# Patient Record
Sex: Female | Born: 1935 | Race: White | Hispanic: No | Smoking: Former smoker
Health system: Southern US, Community
[De-identification: ages and names within clinical notes are randomized; demographics above are authoritative.]

## PROBLEM LIST (undated history)

## (undated) DIAGNOSIS — E669 Obesity, unspecified: Secondary | ICD-10-CM

## (undated) DIAGNOSIS — R351 Nocturia: Secondary | ICD-10-CM

## (undated) DIAGNOSIS — K5790 Diverticulosis of intestine, part unspecified, without perforation or abscess without bleeding: Secondary | ICD-10-CM

## (undated) DIAGNOSIS — K449 Diaphragmatic hernia without obstruction or gangrene: Secondary | ICD-10-CM

## (undated) DIAGNOSIS — R0602 Shortness of breath: Secondary | ICD-10-CM

## (undated) DIAGNOSIS — M810 Age-related osteoporosis without current pathological fracture: Secondary | ICD-10-CM

## (undated) DIAGNOSIS — C50919 Malignant neoplasm of unspecified site of unspecified female breast: Secondary | ICD-10-CM

## (undated) DIAGNOSIS — M2550 Pain in unspecified joint: Secondary | ICD-10-CM

## (undated) DIAGNOSIS — H269 Unspecified cataract: Secondary | ICD-10-CM

## (undated) DIAGNOSIS — R350 Frequency of micturition: Secondary | ICD-10-CM

## (undated) DIAGNOSIS — C801 Malignant (primary) neoplasm, unspecified: Secondary | ICD-10-CM

## (undated) DIAGNOSIS — R0981 Nasal congestion: Secondary | ICD-10-CM

## (undated) DIAGNOSIS — J3489 Other specified disorders of nose and nasal sinuses: Secondary | ICD-10-CM

## (undated) DIAGNOSIS — H3530 Unspecified macular degeneration: Secondary | ICD-10-CM

## (undated) DIAGNOSIS — R7303 Prediabetes: Secondary | ICD-10-CM

## (undated) DIAGNOSIS — I4891 Unspecified atrial fibrillation: Secondary | ICD-10-CM

## (undated) DIAGNOSIS — Z9889 Other specified postprocedural states: Secondary | ICD-10-CM

## (undated) DIAGNOSIS — E785 Hyperlipidemia, unspecified: Secondary | ICD-10-CM

## (undated) DIAGNOSIS — K5792 Diverticulitis of intestine, part unspecified, without perforation or abscess without bleeding: Secondary | ICD-10-CM

## (undated) DIAGNOSIS — I10 Essential (primary) hypertension: Secondary | ICD-10-CM

## (undated) DIAGNOSIS — R112 Nausea with vomiting, unspecified: Secondary | ICD-10-CM

## (undated) DIAGNOSIS — M1990 Unspecified osteoarthritis, unspecified site: Secondary | ICD-10-CM

## (undated) HISTORY — DX: Diaphragmatic hernia without obstruction or gangrene: K44.9

## (undated) HISTORY — PX: HEMORRHOID SURGERY: SHX153

## (undated) HISTORY — DX: Unspecified atrial fibrillation: I48.91

## (undated) HISTORY — PX: COLONOSCOPY: SHX174

## (undated) HISTORY — DX: Prediabetes: R73.03

## (undated) HISTORY — PX: ESOPHAGOGASTRODUODENOSCOPY: SHX1529

## (undated) HISTORY — DX: Unspecified macular degeneration: H35.30

## (undated) HISTORY — DX: Obesity, unspecified: E66.9

## (undated) HISTORY — DX: Nasal congestion: R09.81

## (undated) HISTORY — DX: Malignant (primary) neoplasm, unspecified: C80.1

## (undated) HISTORY — DX: Diverticulitis of intestine, part unspecified, without perforation or abscess without bleeding: K57.92

## (undated) HISTORY — DX: Age-related osteoporosis without current pathological fracture: M81.0

---

## 1982-04-27 HISTORY — PX: OTHER SURGICAL HISTORY: SHX169

## 1991-04-28 HISTORY — PX: OTHER SURGICAL HISTORY: SHX169

## 1999-11-07 ENCOUNTER — Ambulatory Visit (HOSPITAL_COMMUNITY): Admission: RE | Admit: 1999-11-07 | Discharge: 1999-11-07 | Payer: Self-pay | Admitting: Gastroenterology

## 2000-09-07 ENCOUNTER — Other Ambulatory Visit: Admission: RE | Admit: 2000-09-07 | Discharge: 2000-09-07 | Payer: Self-pay | Admitting: Family Medicine

## 2003-02-26 DIAGNOSIS — H3530 Unspecified macular degeneration: Secondary | ICD-10-CM

## 2003-02-26 HISTORY — DX: Unspecified macular degeneration: H35.30

## 2006-04-27 HISTORY — PX: JOINT REPLACEMENT: SHX530

## 2006-04-27 HISTORY — PX: OTHER SURGICAL HISTORY: SHX169

## 2006-08-03 ENCOUNTER — Inpatient Hospital Stay (HOSPITAL_COMMUNITY): Admission: RE | Admit: 2006-08-03 | Discharge: 2006-08-06 | Payer: Self-pay | Admitting: Orthopedic Surgery

## 2006-08-05 ENCOUNTER — Ambulatory Visit: Payer: Self-pay | Admitting: Physical Medicine & Rehabilitation

## 2006-08-06 ENCOUNTER — Inpatient Hospital Stay (HOSPITAL_COMMUNITY)
Admission: RE | Admit: 2006-08-06 | Discharge: 2006-08-16 | Payer: Self-pay | Admitting: Physical Medicine & Rehabilitation

## 2011-04-07 ENCOUNTER — Other Ambulatory Visit: Payer: Self-pay

## 2011-04-08 ENCOUNTER — Other Ambulatory Visit: Payer: Self-pay | Admitting: Radiology

## 2011-04-08 DIAGNOSIS — C50912 Malignant neoplasm of unspecified site of left female breast: Secondary | ICD-10-CM

## 2011-04-09 ENCOUNTER — Other Ambulatory Visit: Payer: Self-pay | Admitting: *Deleted

## 2011-04-09 ENCOUNTER — Telehealth: Payer: Self-pay | Admitting: *Deleted

## 2011-04-09 DIAGNOSIS — C50519 Malignant neoplasm of lower-outer quadrant of unspecified female breast: Secondary | ICD-10-CM

## 2011-04-09 NOTE — Telephone Encounter (Signed)
Confirmed [facility] for 04/15/11 at 0815 .  Instructions and contact information given.

## 2011-04-14 ENCOUNTER — Ambulatory Visit
Admission: RE | Admit: 2011-04-14 | Discharge: 2011-04-14 | Disposition: A | Discharge status: Home / Self Care | Payer: Medicare Other | Source: Ambulatory Visit | Attending: Radiology | Admitting: Radiology

## 2011-04-14 DIAGNOSIS — C50912 Malignant neoplasm of unspecified site of left female breast: Secondary | ICD-10-CM

## 2011-04-14 MED ORDER — GADOBENATE DIMEGLUMINE 529 MG/ML IV SOLN: 5.0000 mL | Freq: Once | INTRAVENOUS | Status: DC | PRN

## 2011-04-14 MED ORDER — GADOBENATE DIMEGLUMINE 529 MG/ML IV SOLN
17.0000 mL | Freq: Once | INTRAVENOUS | Status: AC | PRN
  Administered 2011-04-14: 17 mL via INTRAVENOUS

## 2011-04-15 ENCOUNTER — Ambulatory Visit: Payer: Medicare Other

## 2011-04-15 ENCOUNTER — Other Ambulatory Visit (INDEPENDENT_AMBULATORY_CARE_PROVIDER_SITE_OTHER): Payer: Self-pay | Admitting: General Surgery

## 2011-04-15 ENCOUNTER — Ambulatory Visit (HOSPITAL_BASED_OUTPATIENT_CLINIC_OR_DEPARTMENT_OTHER): Payer: Medicare Other | Admitting: General Surgery

## 2011-04-15 ENCOUNTER — Ambulatory Visit (HOSPITAL_BASED_OUTPATIENT_CLINIC_OR_DEPARTMENT_OTHER): Payer: Medicare Other | Admitting: Oncology

## 2011-04-15 ENCOUNTER — Telehealth: Payer: Self-pay | Admitting: Oncology

## 2011-04-15 ENCOUNTER — Telehealth: Payer: Self-pay | Admitting: *Deleted

## 2011-04-15 ENCOUNTER — Ambulatory Visit: Payer: Medicare Other | Admitting: Physical Therapy

## 2011-04-15 ENCOUNTER — Ambulatory Visit
Admission: RE | Admit: 2011-04-15 | Discharge: 2011-04-15 | Disposition: A | Discharge status: Home / Self Care | Payer: Medicare Other | Source: Ambulatory Visit | Attending: Radiation Oncology | Admitting: Radiation Oncology

## 2011-04-15 ENCOUNTER — Encounter (INDEPENDENT_AMBULATORY_CARE_PROVIDER_SITE_OTHER): Payer: Self-pay | Admitting: General Surgery

## 2011-04-15 ENCOUNTER — Other Ambulatory Visit (HOSPITAL_BASED_OUTPATIENT_CLINIC_OR_DEPARTMENT_OTHER): Payer: Medicare Other | Admitting: Lab

## 2011-04-15 ENCOUNTER — Ambulatory Visit: Payer: Medicare Other | Admitting: Radiation Oncology

## 2011-04-15 VITALS — BP 130/76 | HR 106 | Temp 98.2°F | Ht 60.0 in | Wt 187.7 lb

## 2011-04-15 DIAGNOSIS — C50912 Malignant neoplasm of unspecified site of left female breast: Secondary | ICD-10-CM | POA: Insufficient documentation

## 2011-04-15 DIAGNOSIS — Z809 Family history of malignant neoplasm, unspecified: Secondary | ICD-10-CM | POA: Insufficient documentation

## 2011-04-15 DIAGNOSIS — Z79899 Other long term (current) drug therapy: Secondary | ICD-10-CM | POA: Insufficient documentation

## 2011-04-15 DIAGNOSIS — C50419 Malignant neoplasm of upper-outer quadrant of unspecified female breast: Secondary | ICD-10-CM | POA: Insufficient documentation

## 2011-04-15 DIAGNOSIS — C50919 Malignant neoplasm of unspecified site of unspecified female breast: Secondary | ICD-10-CM

## 2011-04-15 DIAGNOSIS — Z170 Estrogen receptor positive status [ER+]: Secondary | ICD-10-CM | POA: Insufficient documentation

## 2011-04-15 DIAGNOSIS — C50519 Malignant neoplasm of lower-outer quadrant of unspecified female breast: Secondary | ICD-10-CM

## 2011-04-15 LAB — CBC WITH DIFFERENTIAL/PLATELET
Basophils Absolute: 0 10*3/uL (ref 0.0–0.1)
Eosinophils Absolute: 0.1 10*3/uL (ref 0.0–0.5)
HGB: 13.6 g/dL (ref 11.6–15.9)
MCV: 94.8 fL (ref 79.5–101.0)
MONO#: 0.6 10*3/uL (ref 0.1–0.9)
NEUT#: 3.7 10*3/uL (ref 1.5–6.5)
RBC: 4.28 10*6/uL (ref 3.70–5.45)
RDW: 14.1 % (ref 11.2–14.5)
WBC: 6.3 10*3/uL (ref 3.9–10.3)
lymph#: 2 10*3/uL (ref 0.9–3.3)

## 2011-04-15 LAB — COMPREHENSIVE METABOLIC PANEL
Albumin: 3.8 g/dL (ref 3.5–5.2)
Alkaline Phosphatase: 52 U/L (ref 39–117)
BUN: 15 mg/dL (ref 6–23)
Calcium: 9.9 mg/dL (ref 8.4–10.5)
Chloride: 99 mEq/L (ref 96–112)
Glucose, Bld: 88 mg/dL (ref 70–99)
Potassium: 3.8 mEq/L (ref 3.5–5.3)
Sodium: 138 mEq/L (ref 135–145)
Total Protein: 7 g/dL (ref 6.0–8.3)

## 2011-04-15 LAB — CANCER ANTIGEN 27.29: CA 27.29: 22 U/mL (ref 0–39)

## 2011-04-15 NOTE — Progress Notes (Signed)
Kathy [name] is an 77 y.o. female.   Chief Complaint: left breast cancer HPI: this patient is seen in our multidisciplinary breast clinic for evaluation of a newly diagnosed invasive left breast cancer discovered on screening mammogram. She denies any prior mammographic abnormalities in the fistulous herself breast exam and has not found any abnormal masses. She denies any systemic symptoms such as headaches, bony pains, or weight loss. On imaging this is a 1 cm mass at the 2:00 position an ultrasound with MRI findings of a 1.7 cm x 1.0 cm area of enhancement in the area of concern. It is ER and PR positive and HER-2/neu negative.  Past Medical History  Diagnosis Date  . Hiatal hernia   . Diverticulitis   hypertension and hyperlipidemia and osteoporosis  Past Surgical History  Procedure Date  . Left breast biopsy 1984  . Right breast biopsy 1993  . Bilateral knee replacements 2008    Family History  Problem Relation Age of Onset  . Cancer Mother     pancreatic  . Cancer Father     esophageal  . Cancer Sister     ovarian  . Cancer Sister     lung  . Cancer Brother     kidney  . Cancer Sister     breast cancer   Social History:  does not have a smoking history on file. She does not have any smokeless tobacco history on file. Her alcohol and drug histories not on file.  Allergies: No Known Allergies  No current outpatient prescriptions on file as of 04/15/2011.   Medications Prior to Admission  Medication Dose Route Frequency Provider Last Rate Last Dose  . gadobenate dimeglumine (MULTIHANCE) injection 17 mL  17 mL Intravenous Once PRN Medication Radiologist   17 mL at 04/14/11 1048  . DISCONTD: gadobenate dimeglumine (MULTIHANCE) injection 5 mL  5 mL Intravenous Once PRN Medication Radiologist        Results for orders placed in visit on 04/15/11 (from the past 48 hour(s))  CBC WITH DIFFERENTIAL     Status: Normal   Collection Time   04/15/11  8:29 AM      Component  Value Range Comment   WBC 6.3  3.9 - 10.3 (10e3/uL)    NEUT# 3.7  1.5 - 6.5 (10e3/uL)    HGB 13.6  11.6 - 15.9 (g/dL)    HCT 21.3  08.6 - 57.8 (%)    Platelets 235  145 - 400 (10e3/uL)    MCV 94.8  79.5 - 101.0 (fL)    MCH 31.9  25.1 - 34.0 (pg)    MCHC 33.6  31.5 - 36.0 (g/dL)    RBC 4.69  6.29 - 5.28 (10e6/uL)    RDW 14.1  11.2 - 14.5 (%)    lymph# 2.0  0.9 - 3.3 (10e3/uL)    MONO# 0.6  0.1 - 0.9 (10e3/uL)    Eosinophils Absolute 0.1  0.0 - 0.5 (10e3/uL)    Basophils Absolute 0.0  0.0 - 0.1 (10e3/uL)    NEUT% 57.6  38.4 - 76.8 (%)    LYMPH% 31.2  14.0 - 49.7 (%)    MONO% 9.4  0.0 - 14.0 (%)    EOS% 1.3  0.0 - 7.0 (%)    BASO% 0.5  0.0 - 2.0 (%)   COMPREHENSIVE METABOLIC PANEL     Status: Normal   Collection Time   04/15/11  8:29 AM      Component Value Range Comment  Sodium 138  135 - 145 (mEq/L)    Potassium 3.8  3.5 - 5.3 (mEq/L)    Chloride 99  96 - 112 (mEq/L)    CO2 32  19 - 32 (mEq/L)    Glucose, Bld 88  70 - 99 (mg/dL)    BUN 15  6 - 23 (mg/dL)    Creatinine, Ser 4.09  0.50 - 1.10 (mg/dL)    Total Bilirubin 0.3  0.3 - 1.2 (mg/dL)    Alkaline Phosphatase 52  39 - 117 (U/L)    AST 13  0 - 37 (U/L)    ALT 10  0 - 35 (U/L)    Total Protein 7.0  6.0 - 8.3 (g/dL)    Albumin 3.8  3.5 - 5.2 (g/dL)    Calcium 9.9  8.4 - 10.5 (mg/dL)    Mr Breast Bilateral W Wo Contrast  04/14/2011  *RADIOLOGY REPORT*  Clinical Data: 77 year old female with recently diagnosed left breast invasive mammary carcinoma.  BUN and creatinine were obtained on site at [facility] at [address] Results:  BUN 11 mg/dL,  Creatinine 0.7 mg/dL.  BILATERAL BREAST MRI WITH AND WITHOUT CONTRAST  Technique: Multiplanar, multisequence MR images of both breasts were obtained prior to and following the intravenous administration of 17ml of Multihance.  Three dimensional images were evaluated at the independent DynaCad workstation.  Comparison:  Prior mammograms from [facility] imaging dated  04/07/2011, 03/26/2011, 03/23/2011, 03/19/2010, 03/18/2009, 03/15/2008.  Findings: There is an irregular enhancing mass located within the left breast centrally located within the middle 1/3 of the breast. This is associated with clip artifact.  This has a plateau enhancement curve.  This measures 1.7 x 1.0 x 1.0 cm in size. There are no additional worrisome enhancing foci within either breast.  There is no evidence for axillary or internal mammary adenopathy.  There is 1.6 cm cyst located within the left lobe the liver.  There are no additional findings.  IMPRESSION: Solitary 1.7 cm irregular enhancing mass with associated clip artifact corresponding to the known (biopsy proven) invasive mammary carcinoma.  Incidental 1.6 cm left lobe hepatic cyst.  THREE-DIMENSIONAL MR IMAGE RENDERING ON INDEPENDENT WORKSTATION:  Three-dimensional MR images were rendered by post-processing of the original MR data on an independent workstation.  The three- dimensional MR images were interpreted, and findings were reported in the accompanying complete MRI report for this study.  BI-RADS CATEGORY 6:  Known biopsy-proven malignancy - appropriate action should be taken.  Original Report Authenticated By: Rolla Plate, M.D.    All other review of systems negative or noncontributory except as stated in the HPI   Blood pressure 130/76, pulse 106, temperature 98.2 F (36.8 C), height 5' (1.524 m), weight 187 lb 11.2 oz (85.14 kg). General appearance: alert, cooperative and no distress Head: Normocephalic, without obvious abnormality, atraumatic Eyes: conjunctivae/corneas clear. PERRL, EOM's intact. Fundi benign. Neck: no adenopathy, no carotid bruit and supple, symmetrical, trachea midline Resp: clear to auscultation bilaterally Chest wall: no tenderness Breasts: she has some diffuse nodularity bilaterally but no dominant masses, no skin changes, no lymphadenopathy appreciated bilaterally Cardio: regular rate and rhythm,  S1, S2 normal, no murmur, click, rub or gallop GI: soft, non-tender; bowel sounds normal; no masses,  no organomegaly Extremities: extremities normal, atraumatic, no cyanosis or edema Pulses: 2+ and symmetric Skin: Skin color, texture, turgor normal. No rashes or lesions Lymph nodes: Cervical, supraclavicular, and axillary nodes normal. Neurologic: grossly normal  Assessment/Plan Left breast cancer She has a  new left breast cancer and would be a fine candidate for both lumpectomy or mastectomy for treatment of this. I think that breast conservation therapy would be a good alternative for her and most easily tolerated for her. I explained the surgical options with the risks and benefits of each and she is in agreement to lumpectomy would be the best for her. I also explained that standard treatment for invasive breast cancer includes Sentinel lymph node biopsy and plus or  minus radiation therapy, however, given her age her she was not offered chemotherapy. Since she is not going to receive any chemotherapy for management of this, I explained that there is no need to perform sentinel lymph node biopsy other than for staging purposes. Again, since this is not going to influence her treatment course in any fashion, we do not need to subject her to the risks of sentinel lymph node biopsy. She is in agreement and understanding of this and we will proceed with needle localized left breast lumpectomy as soon as available. Lodema Pilot DAVID 04/15/2011, 12:42 PM

## 2011-04-15 NOTE — Progress Notes (Signed)
[facility] Radiation Oncology NEW PATIENT EVALUATION  Name: Kathy [name] MRN: [id number]  Date: 04/15/2011  DOB: [birth year]  Status: outpatient   CC:No primary provider on file.  Rulon Abide, DO    REFERRING PHYSICIAN: Rulon Abide, DO   DIAGNOSIS:  Stage I (T1, N0, M0) invasive ductal carcinoma of the left breast   HISTORY OF PRESENT ILLNESS::Kathy [name] is a 77 y.o. female who is   Seen today for evaluation of her T1 N0 invasive ductal carcinoma of the left breast. At the time of a screening mammogram at [facility] on 03/23/2011 she was noted to have a possible abnormality within the upper-outer quadrant of the left breast. Additional images and ultrasound on 03/26/2011 showed mildly suspicious findings which may represent scar from previous surgery. PS GI on 03/31/2011 showed a focally abnormal area of uptake measuring 1.6 cm corresponding to the density seen at 2 to 3:00 within the left breast. A stereotactic biopsy on 04/07/2011 showed ductal hyperplasia with a CC approach, but invasive mammary carcinoma from a.LM. approach. LV I. was seen. The tumor was strongly ER/PR positive with a low proliferation marker of 10%. Breast MRI or showed a 1.7 x 1.0 cm area of enhancement in the area of concern. The tumor was ER/PR positive and HER-2/neu negative. His she is without complaints today. She is seen with Dr. Darnelle Catalan and Dr. Biagio Quint at the [facility].Marland Kitchen   PREVIOUS RADIATION THERAPY: No   PAST MEDICAL HISTORY:  has a past medical history of Hiatal hernia and Diverticulitis.     PAST SURGICAL HISTORY: Past Surgical History  Procedure Date  . Left breast biopsy 1984  . Right breast biopsy 1993  . Bilateral knee replacements 2008     FAMILY HISTORY: family history includes Cancer in her brother, father, mother, and sisters.   SOCIAL HISTORY:  does not have a smoking history on file. She does not have any smokeless tobacco history on file.   ALLERGIES:  Review of patient's allergies indicates no known allergies.   MEDICATIONS:  Current Outpatient Prescriptions  Medication Sig Dispense Refill  . alendronate (FOSAMAX) 70 MG tablet Take 70 mg by mouth every 7 (seven) days. Take with a full glass of water on an empty stomach.       Marland Kitchen amLODipine (NORVASC) 5 MG tablet Take 5 mg by mouth daily.        Marland Kitchen aspirin 81 MG tablet Take 81 mg by mouth daily.        . calcium carbonate (OS-CAL) 600 MG TABS Take 600 mg by mouth 2 (two) times daily with a meal.        . fish oil-omega-3 fatty acids 1000 MG capsule Take 1 g by mouth daily.        . magnesium gluconate (MAGONATE) 500 MG tablet Take 500 mg by mouth.        . simvastatin (ZOCOR) 20 MG tablet Take 20 mg by mouth at bedtime.        . valsartan-hydrochlorothiazide (DIOVAN-HCT) 320-25 MG per tablet Take 1 tablet by mouth daily.        . vitamin B-12 (CYANOCOBALAMIN) 1000 MCG tablet Take 1,000 mcg by mouth daily.        . vitamin C (ASCORBIC ACID) 500 MG tablet Take 500 mg by mouth daily.        . vitamin E 400 UNIT capsule Take 400 Units by mouth daily.  REVIEW OF SYSTEMS:  Pertinent items are noted in HPI.    PHYSICAL EXAM:  Alert and oriented. Vital signs: BP 1:30/76 pulse 106 temperature 98.2 RR 20 Head and neck examination: Grossly unremarkable. Nodes: Without palpable cervical, supraclavicular, or axillary lymphadenopathy. Chest: Lungs clear. Back: Without spinal or CVA tenderness. Heart: Regular rate and rhythm. Breasts: Tablet I can feel a 1 cm mass at 4:00, 4 centers from the nipple along the lower outer quadrant of the left breast. Her biopsy was at 5:00 and also 12:00. Right breast without masses or lesions. Abdomen without hepatomegaly. Extremities without edema.    LABORATORY DATA:  Lab Results  Component Value Date   WBC 6.3 04/15/2011   HGB 13.6 04/15/2011   HCT 40.6 04/15/2011   MCV 94.8 04/15/2011   PLT 235 04/15/2011   Lab Results  Component Value Date   NA  138 04/15/2011   K 3.8 04/15/2011   CL 99 04/15/2011   CO2 32 04/15/2011   Lab Results  Component Value Date   ALT 10 04/15/2011   AST 13 04/15/2011   ALKPHOS 52 04/15/2011   BILITOT 0.3 04/15/2011     IMPRESSION: Stage I (T1, N0, M0) invasive ductal carcinoma of the left breast. I explained to the patient and her husband that her local treatment options include mastectomy versus partial mastectomy plus or minus hormone therapy, plus or minus radiation therapy. If she undergoes adjuvant hormone therapy then there is little benefit for adjuvant radiation therapy. She would be a candidate for hypo-fractionated radiation therapy if she does not tolerate hormone therapy or if she prefers not to take hormone therapy. Hormone therapy will be discussed by Dr. Darnelle Catalan. I briefly discussed the potential acute and late toxicities of radiation therapy.   PLAN: As above.   I spent 40 minutes minutes face to face with the patient and more than 50% of that time was spent in counseling and/or coordination of care.

## 2011-04-15 NOTE — Telephone Encounter (Signed)
gave patient appointment for early feb printed out calendar and gave to the patient

## 2011-04-15 NOTE — Telephone Encounter (Signed)
Gv pt appt for dec-feb2013 °

## 2011-04-15 NOTE — Progress Notes (Signed)
Kathy [name]  MR#: [id number]    History of present illness: The patient is an 77-year-old [place] woman who had routine yearly screening mammography at [facility] 03/23/2011. A possible area of distortion was noted in the upper outer quadrant of the left breast and she was recalled for additional views November 29. There was a stellate mass in the outer lower quadrant of the left breast which appears more prominent. By ultrasound there was a suggestion of a 1 cm mass at the site of the scar.  [facility] GI was performed 03/31/2011 1 showed a focal intense isotopic area of activity measuring 1.6 cm at the site in question. Biopsy of this area was performed 04/07/2011 and showed ([id number]) and invasive ductal carcinoma, grade 2, which was estrogen receptor positive at 100%, progesterone receptor positive at 69%, with an MIB-1-1 of 10%, and no HER-2 amplification.  With this information the patient was discussed 04/15/2011 at the multidisciplinary conference and subsequently the same day at the multidisciplinary breast cancer clinic.  Past medical history:     Hypertension, diverticular disease, hiatal hernia, hypercholesterolemia, osteopenia, peripheral neuropathy  Past surgical history:     Status post bilateral knee replacements under Dr. Olin, prior right breast biopsy in 1993, prior left breast biopsy in 1984, both benign.  Family history: The patient's father died from stomach or esophageal cancer at the age of 47. The patient's mother died from pancreas cancer at the age of 76 the patient has 4 sisters and 4 brothers. One brother died from kidney cancer and the other 3 from heart disease. One sister died of ovarian cancer which was diagnosed in her 60s. One sister died from "female organ cancer" diagnosed in her 70s. One sister died from lung cancer. The other surviving sister, currently 91, was just diagnosed with breast cancer.   Gynecologic history: GX P0. Menarche age 14, menopause in 1992.  She took hormone replacement approximately 8 years, stopping about 10 years ago. She had no complications from that treatment.    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 Her husband "S. L." Used to work in a machine shop. As noted, they have no children, and live by themselves. The patient drives, does all her cooking and a house chores, and is normally active for an 77-year-old. She does not exercise regularly appear    ADVANCED DIRECTIVES: No advanced directives in place  Health maintenance:       History  Substance Use Topics  . Smoking status: Not on file  . Smokeless tobacco: Not on file  . Alcohol Use: Not on file   the patient smoked less than a pack a day for a few years, quitting in the 1960s. She does not use alcohol.    Colonoscopy: 2006  PAP: 2000  Bone density: NOV 2012, lowest reading = T -1.2  Cholesterol: controlled w. Medication.  Review of systems:  She has pain in her shoulders and hips. Her vision is not what it used to be. She has a little bit of a runny nose and some sinus problems. Her dentures don't fit perfectly. She thinks she has poor circulation and that may explain her peripheral neuropathy. She does have a history of heartburn and hiatal hernia. Overall however as detailed review of systems was noncontributory.  Allergies:    No Known Allergies  Medications:      Current Outpatient Prescriptions  Medication Sig Dispense Refill  . alendronate (FOSAMAX) 70 MG   tablet Take 70 mg by mouth every 7 (seven) days. Take with a full glass of water on an empty stomach.       . amLODipine (NORVASC) 5 MG tablet Take 5 mg by mouth daily.        . aspirin 81 MG tablet Take 81 mg by mouth daily.        . calcium carbonate (OS-CAL) 600 MG TABS Take 600 mg by mouth 2 (two) times daily with a meal.        . fish oil-omega-3 fatty acids 1000 MG capsule Take 1 g by mouth daily.        . magnesium gluconate (MAGONATE) 500 MG tablet Take 500 mg  by mouth.        . simvastatin (ZOCOR) 20 MG tablet Take 20 mg by mouth at bedtime.        . valsartan-hydrochlorothiazide (DIOVAN-HCT) 320-25 MG per tablet Take 1 tablet by mouth daily.        . vitamin B-12 (CYANOCOBALAMIN) 1000 MCG tablet Take 1,000 mcg by mouth daily.        . vitamin C (ASCORBIC ACID) 500 MG tablet Take 500 mg by mouth daily.        . vitamin E 400 UNIT capsule Take 400 Units by mouth daily.         No current facility-administered medications for this visit.   Facility-Administered Medications Ordered in Other Visits  Medication Dose Route Frequency Provider Last Rate Last Dose  . gadobenate dimeglumine (MULTIHANCE) injection 17 mL  17 mL Intravenous Once PRN Medication Radiologist   17 mL at 04/14/11 1048  . DISCONTD: gadobenate dimeglumine (MULTIHANCE) injection 5 mL  5 mL Intravenous Once PRN Medication Radiologist        Physical exam:  Elderly white woman who appears slightly anxious, but in no acute distress. She is alert and oriented x3.    Filed Vitals:   04/15/11 0846  BP: 130/76  Pulse: 106  Temp: 98.2 F (36.8 C)     Body mass index is 36.66 kg/(m^2).   ECOG performance status: 0  Oropharynx: Clear  Adenopathy: No peripheral adenopathy noted and in particular the left axilla is unremarkable  Lungs: No rales or rhonchi  Heart: Regular rate and rhythm, no murmur appreciated  Right breast no suspicious masses  Left breast: Status post recent biopsy, with mild ecchymosis, no palpable mass, no nipple retraction or other skin change  Abdomen: Positive bowel sounds no masses palpated  Musculoskeletal exam: 1+ bilateral lower extremity edema, no erythema  Neurologic exam: Nonfocal  Lab results:            Chemistry      Component Value Date/Time   NA 138 04/15/2011 0829   K 3.8 04/15/2011 0829   CL 99 04/15/2011 0829   CO2 32 04/15/2011 0829   BUN 15 04/15/2011 0829   CREATININE 0.81 04/15/2011 0829      Component Value Date/Time    CALCIUM 9.9 04/15/2011 0829   ALKPHOS 52 04/15/2011 0829   AST 13 04/15/2011 0829   ALT 10 04/15/2011 0829   BILITOT 0.3 04/15/2011 0829         Lab Results  Component Value Date   WBC 6.3 04/15/2011   HGB 13.6 04/15/2011   HCT 40.6 04/15/2011   MCV 94.8 04/15/2011   PLT 235 04/15/2011   NEUTROABS 3.7 04/15/2011    Studies:      Mr Breast Bilateral W Wo Contrast    04/14/2011  *RADIOLOGY REPORT*  Clinical Data: 77-year-old female with recently diagnosed left breast invasive mammary carcinoma.  BUN and creatinine were obtained on site at [facility] at [address] Results:  BUN 11 mg/dL,  Creatinine 0.7 mg/dL.  BILATERAL BREAST MRI WITH AND WITHOUT CONTRAST  Technique: Multiplanar, multisequence MR images of both breasts were obtained prior to and following the intravenous administration of 17ml of Multihance.  Three dimensional images were evaluated at the independent DynaCad workstation.  Comparison:  Prior mammograms from [facility] dated 04/07/2011, 03/26/2011, 03/23/2011, 03/19/2010, 03/18/2009, 03/15/2008.  Findings: There is an irregular enhancing mass located within the left breast centrally located within the middle 1/3 of the breast. This is associated with clip artifact.  This has a plateau enhancement curve.  This measures 1.7 x 1.0 x 1.0 cm in size. There are no additional worrisome enhancing foci within either breast.  There is no evidence for axillary or internal mammary adenopathy.  There is 1.6 cm cyst located within the left lobe the liver.  There are no additional findings.  IMPRESSION: Solitary 1.7 cm irregular enhancing mass with associated clip artifact corresponding to the known (biopsy proven) invasive mammary carcinoma.  Incidental 1.6 cm left lobe hepatic cyst.  THREE-DIMENSIONAL MR IMAGE RENDERING ON INDEPENDENT WORKSTATION:  Three-dimensional MR images were rendered by post-processing of the original MR data on an independent workstation.  The three-  dimensional MR images were interpreted, and findings were reported in the accompanying complete MRI report for this study.  BI-RADS CATEGORY 6:  Known biopsy-proven malignancy - appropriate action should be taken.  Original Report Authenticated By: Britt JACKSON, M.D.     Assessment: 77-year-old [place] woman status post left breast biopsy December of 2012 for a clinical 1.6 cm N0 (stage I) invasive ductal carcinoma, grade 2, strongly estrogen and progesterone receptor positive, with a low MIB-1, and no HER-2 amplification.  Plan: Her case was discussed extensively at this morning at conference and again at the clinic today. We feel the patient would do best with simple lumpectomy, without sentinel lymph node sampling, as that would not affect her treatment. We also think she would do best with antiestrogen therapy, and avoid radiation a less margins are exceedingly close. All this was discussed with the patient and given to her in writing.  Even though the patient is 82 and herself does not have issue, I think she would warrant a genetics consult. This may be useful to the rest of the family if nothing else. This will be scheduled.  She will return to see me in early February. We should hold information at that time, and I will likely start her on letrozole then.     Tawney Vanorman C 04/15/2011      

## 2011-04-16 ENCOUNTER — Encounter: Payer: Self-pay | Admitting: *Deleted

## 2011-04-16 NOTE — Progress Notes (Signed)
Clinical Social Work met with pt and pt's husband at [facility].  CSW informed pt of the patient and family support center, programs, and resources.  CSW also provided pt with contact information and a patient and family support program calendar.  Pt did not express any urgent needs, and was thankful for the support.  CSW encouraged pt to contact CSW with any needs and/or concerns.   Pheonix Wisby P, LCSW

## 2011-04-17 ENCOUNTER — Ambulatory Visit: Payer: Medicare Other

## 2011-04-17 NOTE — Progress Notes (Signed)
Pt. Seen for genetic counseling.  Does not want to pursue genetic analysis.  Knows to call if changes mind

## 2011-04-22 ENCOUNTER — Encounter (HOSPITAL_COMMUNITY): Payer: Self-pay | Admitting: Pharmacy Technician

## 2011-04-23 ENCOUNTER — Telehealth: Payer: Self-pay | Admitting: *Deleted

## 2011-04-23 NOTE — Telephone Encounter (Signed)
Attempted to call pt to discuss [facility] from 04/15/11, unable to leave message, no answering machine.  Will attempt to call again.

## 2011-04-23 NOTE — Telephone Encounter (Signed)
Spoke to pt concerning [facility] from 04/15/11.  Pt denies questions or concerns at this time.  Encourage pt to call with needs.  Received verbal understanding.  Contact information given.

## 2011-04-27 ENCOUNTER — Encounter: Payer: Self-pay | Admitting: *Deleted

## 2011-04-27 NOTE — Progress Notes (Signed)
Mailed after appt letter to pt. 

## 2011-04-29 ENCOUNTER — Ambulatory Visit: Payer: Medicare Other | Admitting: Oncology

## 2011-04-30 ENCOUNTER — Other Ambulatory Visit: Payer: Self-pay

## 2011-04-30 ENCOUNTER — Encounter (HOSPITAL_COMMUNITY)
Admission: RE | Admit: 2011-04-30 | Discharge: 2011-04-30 | Disposition: A | Discharge status: Home / Self Care | Payer: Medicare Other | Source: Ambulatory Visit | Attending: Anesthesiology | Admitting: Anesthesiology

## 2011-04-30 ENCOUNTER — Encounter (HOSPITAL_COMMUNITY)
Admission: RE | Admit: 2011-04-30 | Discharge: 2011-04-30 | Disposition: A | Discharge status: Home / Self Care | Payer: Medicare Other | Source: Ambulatory Visit | Attending: General Surgery | Admitting: General Surgery

## 2011-04-30 ENCOUNTER — Encounter (HOSPITAL_COMMUNITY): Payer: Self-pay

## 2011-04-30 DIAGNOSIS — Z01811 Encounter for preprocedural respiratory examination: Secondary | ICD-10-CM | POA: Diagnosis not present

## 2011-04-30 DIAGNOSIS — E7800 Pure hypercholesterolemia, unspecified: Secondary | ICD-10-CM | POA: Diagnosis not present

## 2011-04-30 DIAGNOSIS — C50919 Malignant neoplasm of unspecified site of unspecified female breast: Secondary | ICD-10-CM

## 2011-04-30 DIAGNOSIS — K449 Diaphragmatic hernia without obstruction or gangrene: Secondary | ICD-10-CM | POA: Diagnosis not present

## 2011-04-30 DIAGNOSIS — Z01810 Encounter for preprocedural cardiovascular examination: Secondary | ICD-10-CM | POA: Diagnosis not present

## 2011-04-30 DIAGNOSIS — I10 Essential (primary) hypertension: Secondary | ICD-10-CM | POA: Diagnosis not present

## 2011-04-30 DIAGNOSIS — Z170 Estrogen receptor positive status [ER+]: Secondary | ICD-10-CM | POA: Diagnosis not present

## 2011-04-30 DIAGNOSIS — G609 Hereditary and idiopathic neuropathy, unspecified: Secondary | ICD-10-CM | POA: Diagnosis not present

## 2011-04-30 DIAGNOSIS — K08409 Partial loss of teeth, unspecified cause, unspecified class: Secondary | ICD-10-CM | POA: Diagnosis not present

## 2011-04-30 DIAGNOSIS — C50419 Malignant neoplasm of upper-outer quadrant of unspecified female breast: Secondary | ICD-10-CM | POA: Diagnosis not present

## 2011-04-30 DIAGNOSIS — Z01812 Encounter for preprocedural laboratory examination: Secondary | ICD-10-CM | POA: Diagnosis not present

## 2011-04-30 DIAGNOSIS — R0602 Shortness of breath: Secondary | ICD-10-CM | POA: Diagnosis not present

## 2011-04-30 DIAGNOSIS — Z01818 Encounter for other preprocedural examination: Secondary | ICD-10-CM | POA: Diagnosis not present

## 2011-04-30 HISTORY — DX: Frequency of micturition: R35.0

## 2011-04-30 HISTORY — DX: Unspecified osteoarthritis, unspecified site: M19.90

## 2011-04-30 HISTORY — DX: Shortness of breath: R06.02

## 2011-04-30 HISTORY — DX: Essential (primary) hypertension: I10

## 2011-04-30 HISTORY — DX: Nausea with vomiting, unspecified: R11.2

## 2011-04-30 HISTORY — DX: Nocturia: R35.1

## 2011-04-30 HISTORY — DX: Diverticulosis of intestine, part unspecified, without perforation or abscess without bleeding: K57.90

## 2011-04-30 HISTORY — DX: Hyperlipidemia, unspecified: E78.5

## 2011-04-30 HISTORY — DX: Other specified disorders of nose and nasal sinuses: J34.89

## 2011-04-30 HISTORY — DX: Other specified postprocedural states: Z98.890

## 2011-04-30 HISTORY — DX: Unspecified cataract: H26.9

## 2011-04-30 HISTORY — DX: Pain in unspecified joint: M25.50

## 2011-04-30 LAB — BASIC METABOLIC PANEL
BUN: 22 mg/dL (ref 6–23)
Chloride: 99 mEq/L (ref 96–112)
Creatinine, Ser: 0.88 mg/dL (ref 0.50–1.10)
GFR calc Af Amer: 70 mL/min — ABNORMAL LOW (ref >90)
Glucose, Bld: 92 mg/dL (ref 70–99)

## 2011-04-30 LAB — CBC
HCT: 43.7 % (ref 36.0–46.0)
Hemoglobin: 14.3 g/dL (ref 12.0–15.0)
MCH: 31 pg (ref 26.0–34.0)
MCHC: 32.7 g/dL (ref 30.0–36.0)
MCV: 94.8 fL (ref 78.0–100.0)
RDW: 13.5 % (ref 11.5–15.5)

## 2011-04-30 LAB — SURGICAL PCR SCREEN: Staphylococcus aureus: NEGATIVE

## 2011-04-30 NOTE — Progress Notes (Signed)
Pt doesn't have a cardiologist;maintained by medical MD for HTN/hyperlipidemia;dr.pickard @ [facility]  Stress test done in 1994(pt states that she had passed out and that's why this was done)

## 2011-04-30 NOTE — Pre-Procedure Instructions (Signed)
20 ROSEALIE REACH  04/30/2011   Your procedure is scheduled on:  Mon,Jan 7 @ 11:30am  Report to [facility] at 9:30 AM.  Call this number if you have problems the morning of surgery: [phone]   Remember:   Do not eat food:After Midnight.  May have clear liquids: up to 4 Hours before arrival.(until 5:30 am)  Clear liquids include soda, tea, black coffee, apple or grape juice, broth.  Take these medicines the morning of surgery with A SIP OF WATER: Amlodipine   Do not wear jewelry, make-up or nail polish.  Do not wear lotions, powders, or perfumes. You may wear deodorant.  Do not shave 48 hours prior to surgery.  Do not bring valuables to the hospital.  Contacts, dentures or bridgework may not be worn into surgery.  Leave suitcase in the car. After surgery it may be brought to your room.  For patients admitted to the hospital, checkout time is 11:00 AM the day of discharge.   Patients discharged the day of surgery will not be allowed to drive home.  Name and phone number of your driver:   Special Instructions: CHG Shower Use Special Wash: 1/2 bottle night before surgery and 1/2 bottle morning of surgery.   Please read over the following fact sheets that you were given: Pain Booklet, Coughing and Deep Breathing, MRSA Information and Surgical Site Infection Prevention

## 2011-05-03 MED ORDER — CEFAZOLIN SODIUM-DEXTROSE 2-3 GM-% IV SOLR
2.0000 g | INTRAVENOUS | Status: AC
  Administered 2011-05-04: 2 g via INTRAVENOUS
  Filled 2011-05-03 (×2): qty 50

## 2011-05-04 ENCOUNTER — Encounter (HOSPITAL_COMMUNITY): Payer: Self-pay | Admitting: Certified Registered"

## 2011-05-04 ENCOUNTER — Ambulatory Visit (HOSPITAL_COMMUNITY)
Admission: RE | Admit: 2011-05-04 | Discharge: 2011-05-04 | Disposition: A | Discharge status: Home / Self Care | Payer: Medicare Other | Source: Ambulatory Visit | Attending: General Surgery | Admitting: General Surgery

## 2011-05-04 ENCOUNTER — Other Ambulatory Visit (INDEPENDENT_AMBULATORY_CARE_PROVIDER_SITE_OTHER): Payer: Self-pay | Admitting: General Surgery

## 2011-05-04 ENCOUNTER — Encounter (HOSPITAL_COMMUNITY)
Admission: RE | Disposition: A | Discharge status: Home / Self Care | Payer: Self-pay | Source: Ambulatory Visit | Attending: General Surgery

## 2011-05-04 ENCOUNTER — Ambulatory Visit (HOSPITAL_COMMUNITY): Payer: Medicare Other | Admitting: Certified Registered"

## 2011-05-04 ENCOUNTER — Encounter (HOSPITAL_COMMUNITY): Payer: Self-pay | Admitting: *Deleted

## 2011-05-04 DIAGNOSIS — G609 Hereditary and idiopathic neuropathy, unspecified: Secondary | ICD-10-CM | POA: Insufficient documentation

## 2011-05-04 DIAGNOSIS — I10 Essential (primary) hypertension: Secondary | ICD-10-CM | POA: Insufficient documentation

## 2011-05-04 DIAGNOSIS — R0602 Shortness of breath: Secondary | ICD-10-CM | POA: Insufficient documentation

## 2011-05-04 DIAGNOSIS — Z01812 Encounter for preprocedural laboratory examination: Secondary | ICD-10-CM | POA: Insufficient documentation

## 2011-05-04 DIAGNOSIS — C50919 Malignant neoplasm of unspecified site of unspecified female breast: Secondary | ICD-10-CM | POA: Diagnosis not present

## 2011-05-04 DIAGNOSIS — E7800 Pure hypercholesterolemia, unspecified: Secondary | ICD-10-CM | POA: Insufficient documentation

## 2011-05-04 DIAGNOSIS — Z01810 Encounter for preprocedural cardiovascular examination: Secondary | ICD-10-CM | POA: Insufficient documentation

## 2011-05-04 DIAGNOSIS — K5732 Diverticulitis of large intestine without perforation or abscess without bleeding: Secondary | ICD-10-CM | POA: Diagnosis not present

## 2011-05-04 DIAGNOSIS — Z01818 Encounter for other preprocedural examination: Secondary | ICD-10-CM | POA: Insufficient documentation

## 2011-05-04 DIAGNOSIS — Z0189 Encounter for other specified special examinations: Secondary | ICD-10-CM | POA: Diagnosis not present

## 2011-05-04 DIAGNOSIS — K08409 Partial loss of teeth, unspecified cause, unspecified class: Secondary | ICD-10-CM | POA: Insufficient documentation

## 2011-05-04 DIAGNOSIS — K449 Diaphragmatic hernia without obstruction or gangrene: Secondary | ICD-10-CM | POA: Insufficient documentation

## 2011-05-04 DIAGNOSIS — C50419 Malignant neoplasm of upper-outer quadrant of unspecified female breast: Secondary | ICD-10-CM | POA: Insufficient documentation

## 2011-05-04 DIAGNOSIS — Z170 Estrogen receptor positive status [ER+]: Secondary | ICD-10-CM | POA: Insufficient documentation

## 2011-05-04 HISTORY — PX: BREAST BIOPSY: SHX20

## 2011-05-04 SURGERY — BREAST BIOPSY WITH NEEDLE LOCALIZATION
Anesthesia: General | Site: Breast | Laterality: Left | Wound class: Clean

## 2011-05-04 MED ORDER — ONDANSETRON HCL 4 MG/2ML IJ SOLN
INTRAMUSCULAR | Status: DC | PRN
  Administered 2011-05-04: 4 mg via INTRAVENOUS

## 2011-05-04 MED ORDER — MIDAZOLAM HCL 5 MG/5ML IJ SOLN
INTRAMUSCULAR | Status: DC | PRN
  Administered 2011-05-04: 2 mg via INTRAVENOUS

## 2011-05-04 MED ORDER — LIDOCAINE-EPINEPHRINE (PF) 1 %-1:200000 IJ SOLN
INTRAMUSCULAR | Status: DC | PRN
  Administered 2011-05-04: 14:00:00

## 2011-05-04 MED ORDER — MEPERIDINE HCL 25 MG/ML IJ SOLN: 6.2500 mg | INTRAMUSCULAR | Status: DC | PRN

## 2011-05-04 MED ORDER — 0.9 % SODIUM CHLORIDE (POUR BTL) OPTIME
TOPICAL | Status: DC | PRN
  Administered 2011-05-04: 1000 mL

## 2011-05-04 MED ORDER — LACTATED RINGERS IV SOLN
INTRAVENOUS | Status: DC | PRN
  Administered 2011-05-04: 12:00:00 via INTRAVENOUS

## 2011-05-04 MED ORDER — PROPOFOL 10 MG/ML IV EMUL
INTRAVENOUS | Status: DC | PRN
  Administered 2011-05-04: 150 mg via INTRAVENOUS

## 2011-05-04 MED ORDER — PROMETHAZINE HCL 25 MG/ML IJ SOLN: 6.2500 mg | INTRAMUSCULAR | Status: DC | PRN

## 2011-05-04 MED ORDER — HYDROCODONE-ACETAMINOPHEN 5-500 MG PO TABS: 1.0000 | ORAL_TABLET | Freq: Four times a day (QID) | ORAL | Status: DC | PRN

## 2011-05-04 MED ORDER — FENTANYL CITRATE 0.05 MG/ML IJ SOLN
INTRAMUSCULAR | Status: DC | PRN
  Administered 2011-05-04: 50 ug via INTRAVENOUS
  Administered 2011-05-04 (×2): 25 ug via INTRAVENOUS

## 2011-05-04 MED ORDER — CEFAZOLIN SODIUM 1-5 GM-% IV SOLN: 1.0000 g | INTRAVENOUS | Status: DC

## 2011-05-04 MED ORDER — LACTATED RINGERS IV SOLN
INTRAVENOUS | Status: DC
  Administered 2011-05-04: 12:00:00 via INTRAVENOUS

## 2011-05-04 MED ORDER — LIDOCAINE HCL (CARDIAC) 20 MG/ML IV SOLN
INTRAVENOUS | Status: DC | PRN
  Administered 2011-05-04: 80 mg via INTRAVENOUS

## 2011-05-04 MED ORDER — HYDROMORPHONE HCL PF 1 MG/ML IJ SOLN: 0.2500 mg | INTRAMUSCULAR | Status: DC | PRN

## 2011-05-04 SURGICAL SUPPLY — 47 items
ADH SKN CLS APL DERMABOND .7 (GAUZE/BANDAGES/DRESSINGS) ×1
ADH SKN CLS LQ APL DERMABOND (GAUZE/BANDAGES/DRESSINGS) ×1
APPLIER CLIP 9.375 MED OPEN (MISCELLANEOUS)
APR CLP MED 9.3 20 MLT OPN (MISCELLANEOUS)
BINDER BREAST LRG (GAUZE/BANDAGES/DRESSINGS) IMPLANT
BINDER BREAST XLRG (GAUZE/BANDAGES/DRESSINGS) ×1 IMPLANT
CANISTER SUCTION 2500CC (MISCELLANEOUS) ×1 IMPLANT
CHLORAPREP W/TINT 26ML (MISCELLANEOUS) ×2 IMPLANT
CLIP APPLIE 9.375 MED OPEN (MISCELLANEOUS) IMPLANT
CLIP TI WIDE RED SMALL 6 (CLIP) ×1 IMPLANT
CLOTH BEACON ORANGE TIMEOUT ST (SAFETY) ×2 IMPLANT
CONT SPEC 4OZ CLIKSEAL STRL BL (MISCELLANEOUS) ×1 IMPLANT
COVER SURGICAL LIGHT HANDLE (MISCELLANEOUS) ×2 IMPLANT
DECANTER SPIKE VIAL GLASS SM (MISCELLANEOUS) ×1 IMPLANT
DERMABOND ADHESIVE PROPEN (GAUZE/BANDAGES/DRESSINGS) ×1
DERMABOND ADVANCED (GAUZE/BANDAGES/DRESSINGS) ×1
DERMABOND ADVANCED .7 DNX12 (GAUZE/BANDAGES/DRESSINGS) IMPLANT
DERMABOND ADVANCED .7 DNX6 (GAUZE/BANDAGES/DRESSINGS) IMPLANT
DEVICE DUBIN SPECIMEN MAMMOGRA (MISCELLANEOUS) ×2 IMPLANT
DRAPE CHEST BREAST 15X10 FENES (DRAPES) ×2 IMPLANT
ELECT CAUTERY BLADE 6.4 (BLADE) ×2 IMPLANT
ELECT COATED BLADE 2.86 ST (ELECTRODE) ×1 IMPLANT
ELECT REM PT RETURN 9FT ADLT (ELECTROSURGICAL) ×2
ELECTRODE REM PT RTRN 9FT ADLT (ELECTROSURGICAL) ×1 IMPLANT
GLOVE BIO SURGEON STRL SZ7.5 (GLOVE) ×1 IMPLANT
GLOVE BIOGEL PI IND STRL 7.5 (GLOVE) IMPLANT
GLOVE BIOGEL PI INDICATOR 7.5 (GLOVE) ×1
GLOVE SURG SS PI 7.5 STRL IVOR (GLOVE) ×4 IMPLANT
GOWN PREVENTION PLUS XLARGE (GOWN DISPOSABLE) ×2 IMPLANT
GOWN STRL NON-REIN LRG LVL3 (GOWN DISPOSABLE) ×2 IMPLANT
KIT BASIN OR (CUSTOM PROCEDURE TRAY) ×2 IMPLANT
KIT MARKER MARGIN INK (KITS) IMPLANT
KIT ROOM TURNOVER OR (KITS) ×2 IMPLANT
NDL HYPO 25GX1X1/2 BEV (NEEDLE) ×1 IMPLANT
NEEDLE HYPO 25GX1X1/2 BEV (NEEDLE) ×2 IMPLANT
NS IRRIG 1000ML POUR BTL (IV SOLUTION) ×2 IMPLANT
PACK GENERAL/GYN (CUSTOM PROCEDURE TRAY) ×2 IMPLANT
PAD ARMBOARD 7.5X6 YLW CONV (MISCELLANEOUS) ×4 IMPLANT
STAPLER VISISTAT 35W (STAPLE) ×2 IMPLANT
SUT MNCRL AB 4-0 PS2 18 (SUTURE) ×2 IMPLANT
SUT SILK 2 0 SH (SUTURE) ×1 IMPLANT
SUT VIC AB 3-0 SH 18 (SUTURE) ×2 IMPLANT
SUT VIC AB 3-0 SH 8-18 (SUTURE) ×1 IMPLANT
SYR CONTROL 10ML LL (SYRINGE) ×2 IMPLANT
TOWEL OR 17X24 6PK STRL BLUE (TOWEL DISPOSABLE) ×2 IMPLANT
TOWEL OR 17X26 10 PK STRL BLUE (TOWEL DISPOSABLE) ×2 IMPLANT
TOWEL OR NON WOVEN STRL DISP B (DISPOSABLE) ×1 IMPLANT

## 2011-05-04 NOTE — Transfer of Care (Signed)
Immediate Anesthesia Transfer of Care Note  Patient: Kathy [name]  Procedure(s) Performed:  BREAST BIOPSY WITH NEEDLE LOCALIZATION  Patient Location: PACU  Anesthesia Type: General  Level of Consciousness: awake and oriented  Airway & Oxygen Therapy: Patient Spontanous Breathing and Patient connected to face mask oxygen  Post-op Assessment: Report given to PACU RN and Post -op Vital signs reviewed and stable  Post vital signs: Reviewed and stable  Complications: No apparent anesthesia complications

## 2011-05-04 NOTE — Op Note (Signed)
Kathy [name], Kathy [name]               ACCOUNT NO.:  [id number]  MEDICAL RECORD NO.:  [id number]  LOCATION:  [facility]                         FACILITY:  [facility]  PHYSICIAN:  Lodema Pilot, MD       DATE OF BIRTH:  1927/06/12  DATE OF PROCEDURE:  05/04/2011 DATE OF DISCHARGE:  05/04/2011                              OPERATIVE REPORT   PROCEDURE:  Left breast, needle localized lumpectomy.  PREOPERATIVE DIAGNOSIS:  Left breast cancer.  POSTOPERATIVE DIAGNOSIS:  Left breast cancer.  SURGEON:  Lodema Pilot, MD  ASSISTANT:  None.  ANESTHESIA:  General LMA anesthesia with 35 mL of 1% lidocaine with epinephrine and 0.25% Marcaine in a 50:50 mixture.  FLUIDS:  1000 mL of crystalloid.  ESTIMATED BLOOD LOSS:  Minimal.  DRAINS:  None.  SPECIMENS: 1. Left breast lumpectomy with a short-stitch mark in the superior     margin and a long-stitch mark in the lateral margin. 2. Additional anterior medial margin with the stitch mark in the new     medial margin.  COMPLICATIONS:  None.  APPARENT FINDINGS:  Successful needle localization of left breast mass, clip had apparently migrated, and so I discussed with Dr. Yolanda Bonine, the radiologist preoperatively, plan and she recommended not focusing on the clip and she feels as though she has localized the tumor with the guidewire.  After excision, she called the room to confirm that we had obtained the mass in question.  There was some calcifications near the medial margin, however, had already taken some additional anterior medial margin.  Metallic hemoclips were placed in the wound.  OPERATIVE DETAILS:  Kathy [name] was seen, evaluated in the preoperative area and risks and benefits of the procedure were again discussed in lay terms.  Informed consent was obtained.  Surgical site was marked prior to anesthetic administration.  She already had undergone needle localization of her left breast mass and she was given prophylactic antibiotics and taken to  the operating room, placed on table supine position.  General LMA anesthesia was obtained and prophylactic antibiotics were given.  Her left chest and breasts were prepped and draped in a standard surgical fashion and a semicircular incision was made in the area of the breast between the needle and the nipple-areolar complex and dissection carried down into the breast tissue using Bovie electrocautery.  Circumferential breast flaps were created and the wire was identified and pulled into the wound and the breast tissue was undermined deep to the guidewire.  Then circumferential dissection was performed using Bovie electrocautery around the guidewire at the medial aspect of her dissection.  The tip of the guidewire was identified and the lesion was undermined and the mass was x-rayed in the room and confirmation by Dr. Yolanda Bonine at [facility] that the mass in question was indeed in the center of the specimen.  However, she noticed some calcifications in the tip of the guidewire.  I had already given the fact that I had seen the tip of the guidewire, had already taken some additional anterior and medial margin and placed a stitch on the new medial margin.  The original lumpectomy specimen was marked with a short stitch on the  superior margin and a long stitch on the lateral margin.  Again additional anterior medial margin was taken with the new silk stitch marking a new medial margin.  There was no other palpable abnormalities or visible abnormalities and then the wound was inspected for hemostasis which was noted to be adequate.  Again the previous biopsy clip was not in the specimen as per Dr. Yolanda Bonine in Radiology that the clip was not in the area of concern and had apparently migrated.  She instructed me to proceed with the excision of the guidewire and the surrounding tissue as usual for procedure.  The wound was again noted to be hemostatic, and the wound was irrigated with  large amount of sterile saline solution until the irrigation returned clear. Again, double-check for hemostasis was noted be adequate.  Hemoclips were placed at 12 o'clock, 3 o'clock, 6 o'clock, and 9 o'clock positions, and one directly in the posterior margin and the dermis was approximated with interrupted 3-0 Vicryl sutures.  These sutures were placed fairly close together as to provide a watertight closure of the dermis.  The 2 medial sutures were now secured at this time, and using an 18-gauge Angiocatheter placed in the wound, I filled the biopsy cavity with 35 mL of 1% lidocaine with epinephrine, 0.25% Marcaine in a 50:50 mixture.  The final dermal sutures were secured leaving the local solution in the wound and the skin edges were approximated with 4-0 Monocryl subcuticular suture.  Skin was washed and dried, and all sponge, needle, instrument counts were correct at the end of the case. The patient tolerated the procedure well without apparent complication.  Again, I had originally recommended sentinel lymph node biopsy for this patient, standard practice given her invasive disease, however, the medical oncologist was not willing to administer chemotherapy to this patient, whether the node was positive or not, so therefore we decided to proceed with a sentinel node biopsy as this would not change her medical management.          ______________________________ Lodema Pilot, MD     BL/MEDQ  D:  05/04/2011  T:  05/04/2011  Job:  [id number]

## 2011-05-04 NOTE — Anesthesia Procedure Notes (Addendum)
Performed by: Malachi Pro   Procedure Name: LMA Insertion Date/Time: 05/04/2011 1:09 PM Performed by: Malachi Pro Pre-anesthesia Checklist: Patient identified, Emergency Drugs available, Suction available, Patient being monitored and Timeout performed Patient Re-evaluated:Patient Re-evaluated prior to inductionOxygen Delivery Method: Circle System Utilized Preoxygenation: Pre-oxygenation with 100% oxygen Intubation Type: IV induction LMA: LMA inserted LMA Size: 4.0 Tube secured with: Tape Dental Injury: Teeth and Oropharynx as per pre-operative assessment

## 2011-05-04 NOTE — Progress Notes (Signed)
Report given to sheryll rn as caregiver

## 2011-05-04 NOTE — Preoperative (Signed)
Beta Blockers   Reason not to administer Beta Blockers:Not Applicable 

## 2011-05-04 NOTE — Interval H&P Note (Signed)
History and Physical Interval Note:  05/04/2011 12:05 PM  Kathy [name]  has presented today for surgery, with the diagnosis of Left breast cancer.  The various methods of treatment have been discussed with the patient and family. After consideration of risks, benefits and other options for treatment, the patient has consented to  Procedure(s): BREAST BIOPSY WITH NEEDLE LOCALIZATION as a surgical intervention .  The patients' history has been reviewed, patient examined, no change in status, stable for surgery.  I have reviewed the patients' chart and labs.  Questions were answered to the patient's satisfaction.  Pt. Seen and examined.  Site marked.  Needle loc placed.  I again discussed with her the risks of the procedure including infection, bleeding, pain, scarring, recurrence, need for reexcision, and I again explained that the norm is for sentinel lymph node biopsy given invasive tumor, however, since the oncologist is not willing to give her chemo, we have decided not to subject her to the sentinel lymph node portion of the procedure, since this will not affect her treatment in any way.   Lodema Pilot DAVID

## 2011-05-04 NOTE — Brief Op Note (Signed)
05/04/2011  2:33 PM  PATIENT:  Suan Halter  78 y.o. female  PRE-OPERATIVE DIAGNOSIS:  Left breast cancer  POST-OPERATIVE DIAGNOSIS:  left breast cancer  PROCEDURE:  Procedure(s): BREAST BIOPSY WITH NEEDLE LOCALIZATION  SURGEON:  Surgeon(s): Rulon Abide, DO  PHYSICIAN ASSISTANT:   ASSISTANTS: none   ANESTHESIA:   general  EBL:  Total I/O In: 1000 [I.V.:1000] Out: -   BLOOD ADMINISTERED:none  DRAINS: none   LOCAL MEDICATIONS USED:  MARCAINE 17CC and LIDOCAINE 17CC  SPECIMEN:  Source of Specimen:  #1: left breast lumpectomy (SS/LL), #2: additional anterior medial margin (suture on new medial margin)  DISPOSITION OF SPECIMEN:  PATHOLOGY  COUNTS:  YES  TOURNIQUET:  * No tourniquets in log *  DICTATION: .Other Dictation: Dictation Number [id number]  PLAN OF CARE: Discharge to home after PACU  PATIENT DISPOSITION:  PACU - hemodynamically stable.   Delay start of Pharmacological VTE agent (>24hrs) due to surgical blood loss or risk of bleeding:  {YES/NO/NOT APPLICABLE:20182

## 2011-05-04 NOTE — Anesthesia Preprocedure Evaluation (Signed)
Anesthesia Evaluation  Patient identified by MRN, date of birth, ID band Patient awake    Reviewed: Allergy & Precautions, H&P , NPO status   History of Anesthesia Complications (+) PONV  Airway Mallampati: I  Neck ROM: Full    Dental  (+) Edentulous Upper, Missing and Partial Lower   Pulmonary shortness of breath and with exertion,  clear to auscultation        Cardiovascular hypertension, Regular Normal    Neuro/Psych  Neuromuscular disease    GI/Hepatic Neg liver ROS, hiatal hernia,   Endo/Other  Negative Endocrine ROS  Renal/GU negative Renal ROS     Musculoskeletal   Abdominal   Peds  Hematology   Anesthesia Other Findings   Reproductive/Obstetrics                           Anesthesia Physical Anesthesia Plan  ASA: II  Anesthesia Plan: General   Post-op Pain Management:    Induction: Intravenous  Airway Management Planned: LMA  Additional Equipment:   Intra-op Plan:   Post-operative Plan: Extubation in OR  Informed Consent:   Dental advisory given  Plan Discussed with: CRNA and Surgeon  Anesthesia Plan Comments:         Anesthesia Quick Evaluation

## 2011-05-04 NOTE — H&P (View-Only) (Signed)
Kathy [name]  MR#: [id number]    History of present illness: The patient is an 78 year old [place] woman who had routine yearly screening mammography at [facility] 03/23/2011. A possible area of distortion was noted in the upper outer quadrant of the left breast and she was recalled for additional views November 29. There was a stellate mass in the outer lower quadrant of the left breast which appears more prominent. By ultrasound there was a suggestion of a 1 cm mass at the site of the scar.  [facility] GI was performed 03/31/2011 1 showed a focal intense isotopic area of activity measuring 1.6 cm at the site in question. Biopsy of this area was performed 04/07/2011 and showed [phone]) and invasive ductal carcinoma, grade 2, which was estrogen receptor positive at 100%, progesterone receptor positive at 69%, with an MIB-1-1 of 10%, and no HER-2 amplification.  With this information the patient was discussed 04/15/2011 at the multidisciplinary conference and subsequently the same day at the multidisciplinary breast cancer clinic.  Past medical history:     Hypertension, diverticular disease, hiatal hernia, hypercholesterolemia, osteopenia, peripheral neuropathy  Past surgical history:     Status post bilateral knee replacements under Dr. Charlann Boxer, prior right breast biopsy in 1993, prior left breast biopsy in 1984, both benign.  Family history: The patient's father died from stomach or esophageal cancer at the age of 59. The patient's mother died from pancreas cancer at the age of 86 the patient has 4 sisters and 4 brothers. One brother died from kidney cancer and the other 3 from heart disease. One sister died of ovarian cancer which was diagnosed in her 74s. One sister died from "female organ cancer" diagnosed in her 35s. One sister died from lung cancer. The other surviving sister, currently 31, was just diagnosed with breast cancer.   Gynecologic history: GX P0. Menarche age 34, menopause in 55.  She took hormone replacement approximately 8 years, stopping about 10 years ago. She had no complications from that treatment.    Social history:   She used to work in a [employer], but is now retired. Her husband "S. L." Used to work in a Insurance claims handler. As noted, they have no children, and live by themselves. The patient drives, does all her cooking and a house chores, and is normally active for an 78 year old. She does not exercise regularly appear    ADVANCED DIRECTIVES: No advanced directives in place  Health maintenance:       History  Substance Use Topics  . Smoking status: Not on file  . Smokeless tobacco: Not on file  . Alcohol Use: Not on file   the patient smoked less than a pack a day for a few years, quitting in the 1960s. She does not use alcohol.    Colonoscopy: 2006  PAP: 2000  Bone density: NOV 2012, lowest reading = T -1.2  Cholesterol: controlled w. Medication.  Review of systems:  She has pain in her shoulders and hips. Her vision is not what it used to be. She has a little bit of a runny nose and some sinus problems. Her dentures don't fit perfectly. She thinks she has poor circulation and that may explain her peripheral neuropathy. She does have a history of heartburn and hiatal hernia. Overall however as detailed review of systems was noncontributory.  Allergies:    No Known Allergies  Medications:      Current Outpatient Prescriptions  Medication Sig Dispense Refill  . alendronate (FOSAMAX) 70 MG  tablet Take 70 mg by mouth every 7 (seven) days. Take with a full glass of water on an empty stomach.       Marland Kitchen amLODipine (NORVASC) 5 MG tablet Take 5 mg by mouth daily.        Marland Kitchen aspirin 81 MG tablet Take 81 mg by mouth daily.        . calcium carbonate (OS-CAL) 600 MG TABS Take 600 mg by mouth 2 (two) times daily with a meal.        . fish oil-omega-3 fatty acids 1000 MG capsule Take 1 g by mouth daily.        . magnesium gluconate (MAGONATE) 500 MG tablet Take 500 mg  by mouth.        . simvastatin (ZOCOR) 20 MG tablet Take 20 mg by mouth at bedtime.        . valsartan-hydrochlorothiazide (DIOVAN-HCT) 320-25 MG per tablet Take 1 tablet by mouth daily.        . vitamin B-12 (CYANOCOBALAMIN) 1000 MCG tablet Take 1,000 mcg by mouth daily.        . vitamin C (ASCORBIC ACID) 500 MG tablet Take 500 mg by mouth daily.        . vitamin E 400 UNIT capsule Take 400 Units by mouth daily.         No current facility-administered medications for this visit.   Facility-Administered Medications Ordered in Other Visits  Medication Dose Route Frequency Provider Last Rate Last Dose  . gadobenate dimeglumine (MULTIHANCE) injection 17 mL  17 mL Intravenous Once PRN Medication Radiologist   17 mL at 04/14/11 1048  . DISCONTD: gadobenate dimeglumine (MULTIHANCE) injection 5 mL  5 mL Intravenous Once PRN Medication Radiologist        Physical exam:  Elderly white woman who appears slightly anxious, but in no acute distress. She is alert and oriented x3.    Filed Vitals:   04/15/11 0846  BP: 130/76  Pulse: 106  Temp: 98.2 F (36.8 C)     Body mass index is 36.66 kg/(m^2).   ECOG performance status: 0  Oropharynx: Clear  Adenopathy: No peripheral adenopathy noted and in particular the left axilla is unremarkable  Lungs: No rales or rhonchi  Heart: Regular rate and rhythm, no murmur appreciated  Right breast no suspicious masses  Left breast: Status post recent biopsy, with mild ecchymosis, no palpable mass, no nipple retraction or other skin change  Abdomen: Positive bowel sounds no masses palpated  Musculoskeletal exam: 1+ bilateral lower extremity edema, no erythema  Neurologic exam: Nonfocal  Lab results:            Chemistry      Component Value Date/Time   NA 138 04/15/2011 0829   K 3.8 04/15/2011 0829   CL 99 04/15/2011 0829   CO2 32 04/15/2011 0829   BUN 15 04/15/2011 0829   CREATININE 0.81 04/15/2011 0829      Component Value Date/Time    CALCIUM 9.9 04/15/2011 0829   ALKPHOS 52 04/15/2011 0829   AST 13 04/15/2011 0829   ALT 10 04/15/2011 0829   BILITOT 0.3 04/15/2011 0829         Lab Results  Component Value Date   WBC 6.3 04/15/2011   HGB 13.6 04/15/2011   HCT 40.6 04/15/2011   MCV 94.8 04/15/2011   PLT 235 04/15/2011   NEUTROABS 3.7 04/15/2011    Studies:      Mr Breast Bilateral W Wo Contrast  04/14/2011  *RADIOLOGY REPORT*  Clinical Data: 78 year old female with recently diagnosed left breast invasive mammary carcinoma.  BUN and creatinine were obtained on site at [facility] at [address] Results:  BUN 11 mg/dL,  Creatinine 0.7 mg/dL.  BILATERAL BREAST MRI WITH AND WITHOUT CONTRAST  Technique: Multiplanar, multisequence MR images of both breasts were obtained prior to and following the intravenous administration of 17ml of Multihance.  Three dimensional images were evaluated at the independent DynaCad workstation.  Comparison:  Prior mammograms from [facility] dated 04/07/2011, 03/26/2011, 03/23/2011, 03/19/2010, 03/18/2009, 03/15/2008.  Findings: There is an irregular enhancing mass located within the left breast centrally located within the middle 1/3 of the breast. This is associated with clip artifact.  This has a plateau enhancement curve.  This measures 1.7 x 1.0 x 1.0 cm in size. There are no additional worrisome enhancing foci within either breast.  There is no evidence for axillary or internal mammary adenopathy.  There is 1.6 cm cyst located within the left lobe the liver.  There are no additional findings.  IMPRESSION: Solitary 1.7 cm irregular enhancing mass with associated clip artifact corresponding to the known (biopsy proven) invasive mammary carcinoma.  Incidental 1.6 cm left lobe hepatic cyst.  THREE-DIMENSIONAL MR IMAGE RENDERING ON INDEPENDENT WORKSTATION:  Three-dimensional MR images were rendered by post-processing of the original MR data on an independent workstation.  The three-  dimensional MR images were interpreted, and findings were reported in the accompanying complete MRI report for this study.  BI-RADS CATEGORY 6:  Known biopsy-proven malignancy - appropriate action should be taken.  Original Report Authenticated By: Rolla Plate, M.D.     Assessment: 78 year old [place] woman status post left breast biopsy December of 2012 for a clinical 1.6 cm N0 (stage I) invasive ductal carcinoma, grade 2, strongly estrogen and progesterone receptor positive, with a low MIB-1, and no HER-2 amplification.  Plan: Her case was discussed extensively at this morning at conference and again at the clinic today. We feel the patient would do best with simple lumpectomy, without sentinel lymph node sampling, as that would not affect her treatment. We also think she would do best with antiestrogen therapy, and avoid radiation a less margins are exceedingly close. All this was discussed with the patient and given to her in writing.  Even though the patient is 68 and herself does not have issue, I think she would warrant a genetics consult. This may be useful to the rest of the family if nothing else. This will be scheduled.  She will return to see me in early February. We should hold information at that time, and I will likely start her on letrozole then.     MAGRINAT,GUSTAV C 04/15/2011

## 2011-05-04 NOTE — Anesthesia Postprocedure Evaluation (Signed)
  Anesthesia Post-op Note  Patient: Kathy [name]  Procedure(s) Performed:  BREAST BIOPSY WITH NEEDLE LOCALIZATION  Patient Location: PACU  Anesthesia Type: General  Level of Consciousness: awake  Airway and Oxygen Therapy: Patient Spontanous Breathing  Post-op Pain: mild  Post-op Assessment: Post-op Vital signs reviewed  Post-op Vital Signs: stable  Complications: No apparent anesthesia complications

## 2011-05-05 ENCOUNTER — Encounter (HOSPITAL_COMMUNITY): Payer: Self-pay | Admitting: General Surgery

## 2011-05-21 ENCOUNTER — Ambulatory Visit (INDEPENDENT_AMBULATORY_CARE_PROVIDER_SITE_OTHER): Payer: Medicare Other | Admitting: General Surgery

## 2011-05-21 ENCOUNTER — Encounter (INDEPENDENT_AMBULATORY_CARE_PROVIDER_SITE_OTHER): Payer: Self-pay | Admitting: General Surgery

## 2011-05-21 ENCOUNTER — Encounter (INDEPENDENT_AMBULATORY_CARE_PROVIDER_SITE_OTHER): Payer: Self-pay

## 2011-05-21 VITALS — BP 146/82 | HR 70 | Temp 98.3°F | Resp 18 | Ht 60.0 in | Wt 189.2 lb

## 2011-05-21 DIAGNOSIS — C50919 Malignant neoplasm of unspecified site of unspecified female breast: Secondary | ICD-10-CM

## 2011-05-21 DIAGNOSIS — C50912 Malignant neoplasm of unspecified site of left female breast: Secondary | ICD-10-CM

## 2011-05-21 NOTE — Progress Notes (Signed)
Subjective:     Patient ID: Kathy [name], female   DOB: 09/18/1927, 78 y.o.   MRN: [id number]  HPI This patient follows up approximately 2 weeks status post needle localized left breast lumpectomy for cancer. She has done well from her surgery and has not taken any pain medication. Her pathology and this was consistent with a 1.6 cm tumor she had negative margins however she has only 1 mm margins at the inferior and posterior margins.   Review of Systems     Objective:   Physical Exam No contrast and nontoxic appearing  Her incision is healing well without sign of infection she has good cosmesis and no evidence of postop palpation.    Assessment:     Status post left needle localized lumpectomy for breast cancer-doing well Given her close inferior and posterior margins I have recommended repeat excision to ensure a wider margin. I've also discussed her care with Dr. Murray who agrees with the plan for reexcision. We will set her up for reexcision of her left breast margins when convenient for her.    Plan:     We will plan for reexcision of left breast margins.      

## 2011-05-25 ENCOUNTER — Encounter (HOSPITAL_BASED_OUTPATIENT_CLINIC_OR_DEPARTMENT_OTHER): Payer: Self-pay | Admitting: *Deleted

## 2011-05-26 ENCOUNTER — Encounter (HOSPITAL_BASED_OUTPATIENT_CLINIC_OR_DEPARTMENT_OTHER)
Admission: RE | Admit: 2011-05-26 | Discharge: 2011-05-26 | Disposition: A | Discharge status: Home / Self Care | Payer: Medicare Other | Source: Ambulatory Visit | Attending: General Surgery | Admitting: General Surgery

## 2011-05-26 DIAGNOSIS — I10 Essential (primary) hypertension: Secondary | ICD-10-CM | POA: Diagnosis not present

## 2011-05-26 DIAGNOSIS — C50919 Malignant neoplasm of unspecified site of unspecified female breast: Secondary | ICD-10-CM | POA: Diagnosis not present

## 2011-05-26 DIAGNOSIS — Z01812 Encounter for preprocedural laboratory examination: Secondary | ICD-10-CM | POA: Diagnosis not present

## 2011-05-26 LAB — BASIC METABOLIC PANEL
CO2: 29 mEq/L (ref 19–32)
Chloride: 100 mEq/L (ref 96–112)
Creatinine, Ser: 0.75 mg/dL (ref 0.50–1.10)
GFR calc Af Amer: 90 mL/min — ABNORMAL LOW (ref >90)
Potassium: 4.8 mEq/L (ref 3.5–5.1)

## 2011-05-28 ENCOUNTER — Ambulatory Visit (HOSPITAL_BASED_OUTPATIENT_CLINIC_OR_DEPARTMENT_OTHER): Payer: Medicare Other | Admitting: *Deleted

## 2011-05-28 ENCOUNTER — Encounter (HOSPITAL_BASED_OUTPATIENT_CLINIC_OR_DEPARTMENT_OTHER): Payer: Self-pay | Admitting: *Deleted

## 2011-05-28 ENCOUNTER — Other Ambulatory Visit (INDEPENDENT_AMBULATORY_CARE_PROVIDER_SITE_OTHER): Payer: Self-pay | Admitting: General Surgery

## 2011-05-28 ENCOUNTER — Encounter (HOSPITAL_BASED_OUTPATIENT_CLINIC_OR_DEPARTMENT_OTHER)
Admission: RE | Disposition: A | Discharge status: Home / Self Care | Payer: Self-pay | Source: Ambulatory Visit | Attending: General Surgery

## 2011-05-28 ENCOUNTER — Ambulatory Visit (HOSPITAL_BASED_OUTPATIENT_CLINIC_OR_DEPARTMENT_OTHER)
Admission: RE | Admit: 2011-05-28 | Discharge: 2011-05-28 | Disposition: A | Discharge status: Home / Self Care | Payer: Medicare Other | Source: Ambulatory Visit | Attending: General Surgery | Admitting: General Surgery

## 2011-05-28 ENCOUNTER — Encounter (HOSPITAL_BASED_OUTPATIENT_CLINIC_OR_DEPARTMENT_OTHER): Payer: Self-pay | Admitting: Anesthesiology

## 2011-05-28 DIAGNOSIS — N6029 Fibroadenosis of unspecified breast: Secondary | ICD-10-CM | POA: Diagnosis not present

## 2011-05-28 DIAGNOSIS — N6019 Diffuse cystic mastopathy of unspecified breast: Secondary | ICD-10-CM | POA: Diagnosis not present

## 2011-05-28 DIAGNOSIS — C50912 Malignant neoplasm of unspecified site of left female breast: Secondary | ICD-10-CM

## 2011-05-28 DIAGNOSIS — C50919 Malignant neoplasm of unspecified site of unspecified female breast: Secondary | ICD-10-CM | POA: Insufficient documentation

## 2011-05-28 DIAGNOSIS — Z01812 Encounter for preprocedural laboratory examination: Secondary | ICD-10-CM | POA: Insufficient documentation

## 2011-05-28 DIAGNOSIS — I10 Essential (primary) hypertension: Secondary | ICD-10-CM | POA: Insufficient documentation

## 2011-05-28 SURGERY — EXCISION, LESION, BREAST
Anesthesia: General | Site: Breast | Laterality: Left | Wound class: Clean

## 2011-05-28 MED ORDER — FENTANYL CITRATE 0.05 MG/ML IJ SOLN: 50.0000 ug | INTRAMUSCULAR | Status: DC | PRN

## 2011-05-28 MED ORDER — FENTANYL CITRATE 0.05 MG/ML IJ SOLN
INTRAMUSCULAR | Status: DC | PRN
  Administered 2011-05-28 (×3): 50 ug via INTRAVENOUS

## 2011-05-28 MED ORDER — BUPIVACAINE HCL (PF) 0.25 % IJ SOLN
INTRAMUSCULAR | Status: DC | PRN
  Administered 2011-05-28: 30 mL

## 2011-05-28 MED ORDER — ONDANSETRON HCL 4 MG/2ML IJ SOLN
INTRAMUSCULAR | Status: DC | PRN
  Administered 2011-05-28: 4 mg via INTRAVENOUS

## 2011-05-28 MED ORDER — LIDOCAINE-EPINEPHRINE (PF) 1 %-1:200000 IJ SOLN
INTRAMUSCULAR | Status: DC | PRN
  Administered 2011-05-28: 30 mL

## 2011-05-28 MED ORDER — CEFAZOLIN SODIUM 1-5 GM-% IV SOLN
1.0000 g | INTRAVENOUS | Status: AC
  Administered 2011-05-28: 2 g via INTRAVENOUS

## 2011-05-28 MED ORDER — LIDOCAINE HCL (CARDIAC) 20 MG/ML IV SOLN
INTRAVENOUS | Status: DC | PRN
  Administered 2011-05-28: 50 mg via INTRAVENOUS

## 2011-05-28 MED ORDER — DEXAMETHASONE SODIUM PHOSPHATE 4 MG/ML IJ SOLN
INTRAMUSCULAR | Status: DC | PRN
  Administered 2011-05-28: 10 mg via INTRAVENOUS

## 2011-05-28 MED ORDER — MIDAZOLAM HCL 2 MG/2ML IJ SOLN: 1.0000 mg | INTRAMUSCULAR | Status: DC | PRN

## 2011-05-28 MED ORDER — PROPOFOL 10 MG/ML IV EMUL
INTRAVENOUS | Status: DC | PRN
  Administered 2011-05-28: 150 mg via INTRAVENOUS

## 2011-05-28 MED ORDER — MIDAZOLAM HCL 5 MG/5ML IJ SOLN
INTRAMUSCULAR | Status: DC | PRN
  Administered 2011-05-28: 2 mg via INTRAVENOUS

## 2011-05-28 MED ORDER — CHLORHEXIDINE GLUCONATE 4 % EX LIQD: 1.0000 "application " | Freq: Once | CUTANEOUS | Status: DC

## 2011-05-28 MED ORDER — LACTATED RINGERS IV SOLN
INTRAVENOUS | Status: DC
  Administered 2011-05-28: 12:00:00 via INTRAVENOUS

## 2011-05-28 SURGICAL SUPPLY — 44 items
APL SKNCLS STERI-STRIP NONHPOA (GAUZE/BANDAGES/DRESSINGS) ×1
BENZOIN TINCTURE PRP APPL 2/3 (GAUZE/BANDAGES/DRESSINGS) ×1 IMPLANT
BINDER BREAST LRG (GAUZE/BANDAGES/DRESSINGS) IMPLANT
BINDER BREAST MEDIUM (GAUZE/BANDAGES/DRESSINGS) IMPLANT
BINDER BREAST XLRG (GAUZE/BANDAGES/DRESSINGS) ×1 IMPLANT
BINDER BREAST XXLRG (GAUZE/BANDAGES/DRESSINGS) IMPLANT
BLADE SURG 15 STRL LF DISP TIS (BLADE) ×1 IMPLANT
BLADE SURG 15 STRL SS (BLADE) ×2
CANISTER SUCTION 1200CC (MISCELLANEOUS) ×3 IMPLANT
CHLORAPREP W/TINT 26ML (MISCELLANEOUS) ×2 IMPLANT
CLIP TI WIDE RED SMALL 6 (CLIP) ×2 IMPLANT
COVER MAYO STAND STRL (DRAPES) ×2 IMPLANT
COVER TABLE BACK 60X90 (DRAPES) ×2 IMPLANT
DECANTER SPIKE VIAL GLASS SM (MISCELLANEOUS) IMPLANT
DRAIN PENROSE 1/2X12 LTX STRL (WOUND CARE) ×1 IMPLANT
DRAPE PED LAPAROTOMY (DRAPES) ×2 IMPLANT
DRAPE UTILITY XL STRL (DRAPES) ×2 IMPLANT
ELECT COATED BLADE 2.86 ST (ELECTRODE) ×2 IMPLANT
ELECT REM PT RETURN 9FT ADLT (ELECTROSURGICAL) ×2
ELECTRODE REM PT RTRN 9FT ADLT (ELECTROSURGICAL) ×1 IMPLANT
GAUZE SPONGE 4X4 12PLY STRL LF (GAUZE/BANDAGES/DRESSINGS) ×3 IMPLANT
GLOVE BIO SURGEON STRL SZ7 (GLOVE) ×1 IMPLANT
GLOVE SURG SS PI 7.5 STRL IVOR (GLOVE) ×3 IMPLANT
GOWN PREVENTION PLUS XLARGE (GOWN DISPOSABLE) ×1 IMPLANT
GOWN PREVENTION PLUS XXLARGE (GOWN DISPOSABLE) ×2 IMPLANT
NDL HYPO 25X1 1.5 SAFETY (NEEDLE) IMPLANT
NEEDLE HYPO 22GX1.5 SAFETY (NEEDLE) ×1 IMPLANT
NEEDLE HYPO 25X1 1.5 SAFETY (NEEDLE) ×2 IMPLANT
NS IRRIG 1000ML POUR BTL (IV SOLUTION) ×1 IMPLANT
PACK BASIN DAY SURGERY FS (CUSTOM PROCEDURE TRAY) ×2 IMPLANT
PENCIL BUTTON HOLSTER BLD 10FT (ELECTRODE) ×2 IMPLANT
SLEEVE SCD COMPRESS KNEE MED (MISCELLANEOUS) ×1 IMPLANT
SPONGE LAP 4X18 X RAY DECT (DISPOSABLE) ×2 IMPLANT
STRIP CLOSURE SKIN 1/2X4 (GAUZE/BANDAGES/DRESSINGS) ×1 IMPLANT
SUT MNCRL AB 4-0 PS2 18 (SUTURE) ×2 IMPLANT
SUT SILK 2 0 SH (SUTURE) ×2 IMPLANT
SUT VICRYL 3-0 CR8 SH (SUTURE) ×2 IMPLANT
SYR CONTROL 10ML LL (SYRINGE) ×2 IMPLANT
TAPE CLOTH SURG 4X10 WHT LF (GAUZE/BANDAGES/DRESSINGS) ×1 IMPLANT
TOWEL OR 17X24 6PK STRL BLUE (TOWEL DISPOSABLE) ×2 IMPLANT
TOWEL OR NON WOVEN STRL DISP B (DISPOSABLE) ×2 IMPLANT
TUBE CONNECTING 20X1/4 (TUBING) ×2 IMPLANT
WATER STERILE IRR 1000ML POUR (IV SOLUTION) ×2 IMPLANT
YANKAUER SUCT BULB TIP NO VENT (SUCTIONS) ×2 IMPLANT

## 2011-05-28 NOTE — Anesthesia Procedure Notes (Signed)
Procedure Name: LMA Insertion Date/Time: 05/28/2011 12:54 PM Performed by: Meyer Russel Pre-anesthesia Checklist: Patient identified, Emergency Drugs available, Suction available, Patient being monitored and Timeout performed Patient Re-evaluated:Patient Re-evaluated prior to inductionOxygen Delivery Method: Circle System Utilized Preoxygenation: Pre-oxygenation with 100% oxygen Intubation Type: IV induction Ventilation: Mask ventilation without difficulty LMA: LMA with gastric port inserted LMA Size: 4.0 Grade View: Grade I Number of attempts: 1 Placement Confirmation: positive ETCO2 and breath sounds checked- equal and bilateral Tube secured with: Tape Dental Injury: Teeth and Oropharynx as per pre-operative assessment

## 2011-05-28 NOTE — Interval H&P Note (Signed)
History and Physical Interval Note:  05/28/2011 11:50 AM  Suan Halter  has presented today for surgery, with the diagnosis of remove additional tissue from area of left breast cancer  The various methods of treatment have been discussed with the patient and family. After consideration of risks, benefits and other options for treatment, the patient has consented to  Procedure(s): RE-EXCISION OF BREAST LUMPECTOMY as a surgical intervention .  The patients' history has been reviewed, patient examined, no change in status, stable for surgery.  I have reviewed the patients' chart and labs.  Questions were answered to the patient's satisfaction.  Site marked.  Will get additional anterior, inferior, and posterior margins.  Risks of infection, bleeding, pain, scarring, poor cosmesis, need for additional surgery for positive margins all discussed and she desires to proceed with reexcision of left breast margins.    Kathy [name] DAVID

## 2011-05-28 NOTE — H&P (View-Only) (Signed)
Subjective:     Patient ID: Kathy [name], female   DOB: 08-29-1927, 78 y.o.   MRN: [id number]  HPI This patient follows up approximately 2 weeks status post needle localized left breast lumpectomy for cancer. She has done well from her surgery and has not taken any pain medication. Her pathology and this was consistent with a 1.6 cm tumor she had negative margins however she has only 1 mm margins at the inferior and posterior margins.   Review of Systems     Objective:   Physical Exam No contrast and nontoxic appearing  Her incision is healing well without sign of infection she has good cosmesis and no evidence of postop palpation.    Assessment:     Status post left needle localized lumpectomy for breast cancer-doing well Given her close inferior and posterior margins I have recommended repeat excision to ensure a wider margin. I've also discussed her care with Dr. Dayton Scrape who agrees with the plan for reexcision. We will set her up for reexcision of her left breast margins when convenient for her.    Plan:     We will plan for reexcision of left breast margins.

## 2011-05-28 NOTE — Anesthesia Postprocedure Evaluation (Signed)
Anesthesia Post Note  Patient: Kathy [name]  Procedure(s) Performed:  RE-EXCISION OF BREAST LUMPECTOMY - re-excision of left breast margins  Anesthesia type: General  Patient location: PACU  Post pain: Pain level controlled and Adequate analgesia  Post assessment: Post-op Vital signs reviewed, Patient's Cardiovascular Status Stable, Respiratory Function Stable, Patent Airway and Pain level controlled  Last Vitals:  Filed Vitals:   05/28/11 1438  BP: 139/66  Pulse: 90  Temp:   Resp: 17    Post vital signs: Reviewed and stable  Level of consciousness: awake, alert  and oriented  Complications: No apparent anesthesia complications

## 2011-05-28 NOTE — Brief Op Note (Signed)
05/28/2011  2:25 PM  PATIENT:  Kathy [name]  78 y.o. female  PRE-OPERATIVE DIAGNOSIS:  remove additional tissue from area of left breast cancer  POST-OPERATIVE DIAGNOSIS:  remove additional tissue from area of left breast cancer  PROCEDURE:  Procedure(s): RE-EXCISION OF BREAST LUMPECTOMY  SURGEON:  Surgeon(s): Rulon Abide, DO  PHYSICIAN ASSISTANT:   ASSISTANTS: none   ANESTHESIA:   general  EBL:  Total I/O In: 700 [I.V.:700] Out: -   BLOOD ADMINISTERED:none  DRAINS: none   LOCAL MEDICATIONS USED:  MARCAINE 25CC and LIDOCAINE 25CC  SPECIMEN:  Source of Specimen:  reexcision of 1. anterior margin 2. deep margin 3. inferior margin 4. additional anterior and inferior margin.  DISPOSITION OF SPECIMEN:  PATHOLOGY  COUNTS:  YES  TOURNIQUET:  * No tourniquets in log *  DICTATION: .Other Dictation: Dictation Number [id number]  PLAN OF CARE: Discharge to home after PACU  PATIENT DISPOSITION:  PACU - hemodynamically stable.   Delay start of Pharmacological VTE agent (>24hrs) due to surgical blood loss or risk of bleeding:  {YES/NO/NOT APPLICABLE:20182

## 2011-05-28 NOTE — Anesthesia Preprocedure Evaluation (Signed)
Anesthesia Evaluation  Patient identified by MRN, date of birth, ID band Patient awake    Reviewed: Allergy & Precautions, H&P , NPO status , Patient's Chart, lab work & pertinent test results  History of Anesthesia Complications (+) PONV and AWARENESS UNDER ANESTHESIA  Airway Mallampati: II TM Distance: >3 FB Neck ROM: Full    Dental   Pulmonary shortness of breath,    Pulmonary exam normal       Cardiovascular hypertension,     Neuro/Psych  Neuromuscular disease    GI/Hepatic hiatal hernia,   Endo/Other    Renal/GU      Musculoskeletal   Abdominal (+) obese,   Peds  Hematology   Anesthesia Other Findings   Reproductive/Obstetrics                           Anesthesia Physical Anesthesia Plan  ASA: II  Anesthesia Plan:    Post-op Pain Management:    Induction: Intravenous  Airway Management Planned: LMA  Additional Equipment:   Intra-op Plan:   Post-operative Plan: Extubation in OR  Informed Consent: I have reviewed the patients History and Physical, chart, labs and discussed the procedure including the risks, benefits and alternatives for the proposed anesthesia with the patient or authorized representative who has indicated his/her understanding and acceptance.     Plan Discussed with: CRNA and Surgeon  Anesthesia Plan Comments:         Anesthesia Quick Evaluation

## 2011-05-28 NOTE — Transfer of Care (Signed)
Immediate Anesthesia Transfer of Care Note  Patient: Kathy [name]  Procedure(s) Performed:  RE-EXCISION OF BREAST LUMPECTOMY - re-excision of left breast margins  Patient Location: PACU  Anesthesia Type: General  Level of Consciousness: awake, alert  and oriented  Airway & Oxygen Therapy: Patient Spontanous Breathing and Patient connected to nasal cannula oxygen  Post-op Assessment: Report given to PACU RN and Post -op Vital signs reviewed and stable  Post vital signs: Reviewed and stable  Complications: No apparent anesthesia complications

## 2011-05-29 NOTE — Op Note (Signed)
NAME:  Kathy [name], Kathy [name]                    ACCOUNT NO.:  MEDICAL RECORD NO.:  [id number]  LOCATION:                                 FACILITY:  PHYSICIAN:  Lodema Pilot, MD       DATE OF BIRTH:  Sep 13, 1927  DATE OF PROCEDURE:  05/28/2011 DATE OF DISCHARGE:                              OPERATIVE REPORT   PROCEDURE:  Re-excision of left breast margins.  PREOPERATIVE DIAGNOSIS:  Left breast cancer.  POSTOPERATIVE DIAGNOSIS:  Left breast cancer.  SURGEON:  Lodema Pilot, MD  ASSISTANT:  None.  ANESTHESIA:  General LMA anesthesia with 50 mL of 1% lidocaine with epinephrine and course of Marcaine in a 50:50 mixture.  FLUIDS:  700 mL of crystalloid.  ESTIMATED BLOOD LOSS:  Minimal.  DRAINS:  None.  SPECIMENS: 1. A short stitch on the new anterior margin and long stitch on the     new medial margin. 2. New deep margin with this stitch on the new deep margin. 3. Additional inferior margin with the stitch on the new inferior     margin. 4. Additional superficial and inferior margin, this suture on the new     anterior and inferior margin.  COMPLICATIONS:  None.  APPARENT FINDINGS:  Re-excision of the entire previous biopsy cavity, deep margin is the chest wall pectoralis muscle and no additional deep margin could be taken.  Superficial margin is dermis and inferior margin as well, this was dealt with very minimal tissue remaining, there would be some additional medial and small amount on lateral margin which could be taken if necessary.  INDICATIONS FOR PROCEDURE:  Kathy [name] is an 78 year old female with a previously excised left breast invasive cancer.  She had positive margins in the anterior, inferior, and posterior margins who needed re- excision.  OPERATIVE DETAILS:  Kathy [name] was seen and evaluated in the preoperative area and risks and benefits of the procedure were again discussed in lay terms.  Informed consent was obtained.  Surgical site was marked prior to  anesthetic administration.  She was taken to the operating room, placed on the table in supine position and general LMA anesthesia was obtained.  Her left breast and chest were prepped and draped in a standard surgical fashion and prior lumpectomy incision was opened to the length of the incision.  Serous fluid was returned and evacuated and she had a very large biopsy cavity from the large specimen was taken previously.  This extended medially under the nipple-areolar complex.  An anterior and superficial margin was taken by undermining the skin and subcutaneous tissue, cephalad to the extent of the prior biopsy cavity, this was extended medial and cephalad as well under the nipple-areolar complex basically at the dermis level.  Under the nipple, there was no additional tissue which could be taken and there was some additional margin that could be resected medially and some small amount laterally as well.  After I had reached the extent of the prior biopsy cavity, this was taken down to the chest wall and undermined and the specimen was amputated.  It was marked with a short stitch on the new anterior margin and  a long stitch on the new medial margin, and this was sent to pathology for permanent sectioning.  Then the floor of prior biopsy cavity was removed down to the pectoral muscle and this was removed and then the stitch was placed on the new deep margin.  The skin and subcutaneous tissues were undermined inferiorly.  The prior biopsy cavity capsule was excised with approximately 1 cm thickness.  However, I could not take much more due to the dermis inferiorly.  If the inferior margin is positive, I do not think additional can be taken. This specimen was amputated and a suture was placed on a new inferior margin.  There was a small patch of capsule still left superficially and inferiorly and this was removed as well with the suture on the new anterior and inferior margin.  The cavity  was then irrigated with sterile saline solution and the irrigation returned clear and hemostasis obtained with Bovie electrocautery.  The new biopsy cavity was large and retracted and likely will dimple in final cosmesis will be marginal. After the wound was noted to be hemostatic, the dermis was approximated with interrupted 3-0 Vicryl sutures for watertight closure.  Prior to tying the final 2 sutures, angiocatheter was placed in the wound and the cavity was infiltrated with 50 mL of 1% lidocaine with epinephrine, and 0.25% Marcaine in a 50:50 mixture.  The final sutures were secured and skin edges were then approximated with 4-0 Monocryl subcuticular suture. Skin was washed and dried, and Benzoin and Steri-Strips were applied and sterile dressing was applied.  Again, after taking so much, the breast tissue concern for dimpling and retraction of the breast in this area, however given the 3 positive margins and given the preoperative discussion with the patient, I felt that it would be best to perform wide excision to minimize the chance of needing additional surgery in this 78 year old patient.  She tolerated the procedure well, and all sponge, needle, and instrument counts were correct at the end of the case, and the patient tolerated the procedure well without apparent complication.          ______________________________ Lodema Pilot, MD     BL/MEDQ  D:  05/28/2011  T:  05/28/2011  Job:  [id number]

## 2011-06-03 ENCOUNTER — Telehealth: Payer: Self-pay | Admitting: Oncology

## 2011-06-03 ENCOUNTER — Ambulatory Visit (HOSPITAL_BASED_OUTPATIENT_CLINIC_OR_DEPARTMENT_OTHER): Payer: Medicare Other | Admitting: Oncology

## 2011-06-03 VITALS — BP 123/77 | HR 96 | Temp 98.7°F | Ht 60.0 in | Wt 188.8 lb

## 2011-06-03 DIAGNOSIS — C50919 Malignant neoplasm of unspecified site of unspecified female breast: Secondary | ICD-10-CM | POA: Diagnosis not present

## 2011-06-03 NOTE — Progress Notes (Signed)
Kathy [name]  MR#: [id number]    History of present illness: The patient is an 78 year old [place] woman who had routine yearly screening mammography at [facility] 03/23/2011. A possible area of distortion was noted in the upper outer quadrant of the left breast and she was recalled for additional views November 29. There was a stellate mass in the outer lower quadrant of the left breast which appears more prominent. By ultrasound there was a suggestion of a 1 cm mass at the site of the scar.  [facility] GI was performed 03/31/2011 1 showed a focal intense isotopic area of activity measuring 1.6 cm at the site in question. Biopsy of this area was performed 04/07/2011 and showed [phone]) and invasive ductal carcinoma, grade 2, which was estrogen receptor positive at 100%, progesterone receptor positive at 69%, with an MIB-1-1 of 10%, and no HER-2 amplification.  With this information the patient was discussed 04/15/2011 at the multidisciplinary conference and subsequently the same day at the multidisciplinary breast cancer clinic.  Interval history: Since her last visit here, the patient had her left lumpectomy, 05/06/2011. This showed multiple positive margins, so on 05/28/2011 she went back of to the operating table for margin clearance. This was successfully accomplished.  Review of systems:  She tolerated the surgery well, with no significant pain, swelling, fever, erythema, or dehiscence. She does have some "soreness" in that area. She has a mild sinus problem currently, a history of heartburn, urinary incontinence, and of course chronic back and joint pain. She feels a little bit anxious and she tells me this is unusual for her. Otherwise a detailed review of systems today was noncontributory.  Past medical history:     Hypertension, diverticular disease, hiatal hernia, hypercholesterolemia, osteopenia, peripheral neuropathy  Past surgical history:     Status post bilateral knee replacements  under Dr. Charlann Boxer, prior right breast biopsy in 1993, prior left breast biopsy in 1984, both benign.  Family history: The patient's father died from stomach or esophageal cancer at the age of 3. The patient's mother died from pancreas cancer at the age of 33 the patient has 4 sisters and 4 brothers. One brother died from kidney cancer and the other 3 from heart disease. One sister died of ovarian cancer which was diagnosed in her 56s. One sister died from "female organ cancer" diagnosed in her 28s. One sister died from lung cancer. The other surviving sister, currently 21, was just diagnosed with breast cancer.   Gynecologic history: GX P0. Menarche age 89, menopause in 50. She took hormone replacement approximately 8 years, stopping about 10 years ago. She had no complications from that treatment.    Social history:   She used to work in a [employer], but is now retired. Her husband "S. L." Used to work in a Insurance claims handler. As noted, they have no children, and live by themselves. The patient drives, does all her cooking and a house chores, and is normally active for an 78 year old. She does not exercise regularly appear    ADVANCED DIRECTIVES: No advanced directives in place  Health maintenance:       History  Substance Use Topics  . Smoking status: Former Smoker    Quit date: 05/21/1971  . Smokeless tobacco: Never Used   Comment: quit in the 60's  . Alcohol Use: No   the patient smoked less than a pack a day for a few years, quitting in the 1960s. She does not use alcohol.    Colonoscopy: 2006  PAP: 2000  Bone density: NOV 2012, lowest reading = T -1.2  Cholesterol: controlled w. Medication.  Allergies:    No Known Allergies  Medications:      Current Outpatient Prescriptions  Medication Sig Dispense Refill  . alendronate (FOSAMAX) 70 MG tablet Take 70 mg by mouth every 7 (seven) days. Tuesday ; Take with a full glass of water on an empty stomach.      Marland Kitchen amLODipine (NORVASC) 5 MG  tablet Take 5 mg by mouth daily.        Marland Kitchen aspirin 81 MG tablet Take 81 mg by mouth daily.        . Calcium Carbonate-Vitamin D (CALTRATE 600+D) 600-400 MG-UNIT per tablet Take 1 tablet by mouth 2 (two) times daily.        . cholecalciferol (VITAMIN D) 400 UNITS TABS Take 400 Units by mouth daily. Take in addition to Caltrate with Vitamin D to get 1200 units of Vitamin D      . fish oil-omega-3 fatty acids 1000 MG capsule Take 1 g by mouth daily.       . magnesium gluconate (MAGONATE) 500 MG tablet Take 500 mg by mouth 2 (two) times daily.       . simvastatin (ZOCOR) 20 MG tablet Take 20 mg by mouth at bedtime.       . valsartan-hydrochlorothiazide (DIOVAN-HCT) 320-25 MG per tablet Take 1 tablet by mouth daily.       . vitamin B-12 (CYANOCOBALAMIN) 1000 MCG tablet Take 1,000 mcg by mouth daily.       . vitamin C (ASCORBIC ACID) 500 MG tablet Take 500 mg by mouth daily.         Physical exam:  Elderly white woman who appears slightly anxious, but in no acute distress. She is alert and oriented x3.    Filed Vitals:   06/03/11 1044  BP: 123/77  Pulse: 96  Temp: 98.7 F (37.1 C)     Body mass index is 36.87 kg/(m^2).   ECOG performance status: 1  Oropharynx: Clear  Adenopathy: No peripheral adenopathy noted and in particular the left axilla is unremarkable  Lungs: No rales or rhonchi  Heart: Regular rate and rhythm, no murmur appreciated  Right breast no suspicious masses  Left breast: Status post lumpectomy and reexcision. Steri-Strips are still in place. There is some dimpling of the breast, and also a small area, perhaps 3 cm, suggestive of a seroma. There is no erythema, no dehiscence, no nipple retraction, and no suspicious skin changes.  Abdomen: Positive bowel sounds no masses palpated  Musculoskeletal exam: Scoliosis; 1+ bilateral lower extremity edema,   Neurologic exam: Nonfocal  Lab results:            Chemistry      Component Value Date/Time   NA 138 05/26/2011  1016   K 4.8 05/26/2011 1016   CL 100 05/26/2011 1016   CO2 29 05/26/2011 1016   BUN 20 05/26/2011 1016   CREATININE 0.75 05/26/2011 1016      Component Value Date/Time   CALCIUM 9.2 05/26/2011 1016   ALKPHOS 52 04/15/2011 0829   AST 13 04/15/2011 0829   ALT 10 04/15/2011 0829   BILITOT 0.3 04/15/2011 0829         Lab Results  Component Value Date   WBC 8.0 04/30/2011   HGB 12.1 05/28/2011   HCT 43.7 04/30/2011   MCV 94.8 04/30/2011   PLT 227 04/30/2011   NEUTROABS 3.7 04/15/2011  Studies:      Mr Breast Bilateral W Wo Contrast  04/14/2011  *RADIOLOGY REPORT*  Clinical Data: 78 year old female with recently diagnosed left breast invasive mammary carcinoma.  BUN and creatinine were obtained on site at [facility] at [address] Results:  BUN 11 mg/dL,  Creatinine 0.7 mg/dL.  BILATERAL BREAST MRI WITH AND WITHOUT CONTRAST  Technique: Multiplanar, multisequence MR images of both breasts were obtained prior to and following the intravenous administration of 17ml of Multihance.  Three dimensional images were evaluated at the independent DynaCad workstation.  Comparison:  Prior mammograms from [facility] dated 04/07/2011, 03/26/2011, 03/23/2011, 03/19/2010, 03/18/2009, 03/15/2008.  Findings: There is an irregular enhancing mass located within the left breast centrally located within the middle 1/3 of the breast. This is associated with clip artifact.  This has a plateau enhancement curve.  This measures 1.7 x 1.0 x 1.0 cm in size. There are no additional worrisome enhancing foci within either breast.  There is no evidence for axillary or internal mammary adenopathy.  There is 1.6 cm cyst located within the left lobe the liver.  There are no additional findings.  IMPRESSION: Solitary 1.7 cm irregular enhancing mass with associated clip artifact corresponding to the known (biopsy proven) invasive mammary carcinoma.  Incidental 1.6 cm left lobe hepatic cyst.  THREE-DIMENSIONAL MR IMAGE  RENDERING ON INDEPENDENT WORKSTATION:  Three-dimensional MR images were rendered by post-processing of the original MR data on an independent workstation.  The three- dimensional MR images were interpreted, and findings were reported in the accompanying complete MRI report for this study.  BI-RADS CATEGORY 6:  Known biopsy-proven malignancy - appropriate action should be taken.  Original Report Authenticated By: Rolla Plate, M.D.   Assessment: 78 year old [place] woman status post left lumpectomy with subsequent margin clearance January of 2013 for a T1c NX, (stage I) invasive ductal carcinoma, grade 2, strongly estrogen and progesterone receptor positive, with a low MIB-1, and no HER-2 amplification.  Plan: We went over her pathology in detail and she understands she has a very good prognosis overall. In women over 47 who have lymph node negative disease that is estrogen receptor positive, we can forego radiation and she takes antiestrogen therapy. We then discussed the difference between the aromatase inhibitors and tamoxifen. She had a bone density 2 months ago which was very favorable and she is already on alendronate. I think she would be a very good candidate for letrozole. We went over the possible toxicities side effects and complications of that medication and I went ahead and wrote her the prescription.  I counseled her to call around up to make sure she does not overpay for this medication. If she has difficulty finding this drug for less than $15 a month she will let us know.  Otherwise she will return here in 6 months just to make sure she is tolerating letrozole well. If she is at we will see her on a every 6 month basis for the first 2 years and then yearly thereafter.      MAGRINAT,GUSTAV C 06/03/2011

## 2011-06-03 NOTE — Telephone Encounter (Signed)
Pt has her march 2013 appt calendar

## 2011-06-10 ENCOUNTER — Ambulatory Visit (INDEPENDENT_AMBULATORY_CARE_PROVIDER_SITE_OTHER): Payer: Medicare Other | Admitting: General Surgery

## 2011-06-10 ENCOUNTER — Encounter (INDEPENDENT_AMBULATORY_CARE_PROVIDER_SITE_OTHER): Payer: Self-pay | Admitting: General Surgery

## 2011-06-10 VITALS — BP 122/68 | HR 70 | Temp 97.8°F | Resp 18 | Ht 60.0 in | Wt 188.4 lb

## 2011-06-10 DIAGNOSIS — Z4889 Encounter for other specified surgical aftercare: Secondary | ICD-10-CM

## 2011-06-10 DIAGNOSIS — C50912 Malignant neoplasm of unspecified site of left female breast: Secondary | ICD-10-CM

## 2011-06-10 DIAGNOSIS — C50919 Malignant neoplasm of unspecified site of unspecified female breast: Secondary | ICD-10-CM

## 2011-06-10 DIAGNOSIS — Z5189 Encounter for other specified aftercare: Secondary | ICD-10-CM

## 2011-06-10 NOTE — Progress Notes (Signed)
Subjective:     Patient ID: Kathy [name], female   DOB: [birth year], 78 y.o.   MRN: [id number]  HPI The patient follows up status post reexcision of left breast margins for a left breast cancer. From pathology was benign with wide excision of anterior margins deep margins inferior margins and superficial margins. She's doing well without complaints. She started hormonal treatment.  Review of Systems     Objective:   Physical Exam The incision looks like it feeling well without sign of infection. There is overall good cosmesis without any evidence of dimpling. No evidence of recurrence.    Assessment:     Status post left breast conservation therapy for a left breast cancer. She seems to be doing very well from her surgery with good cosmesis. Her margins are now widely negative. She is following up with Dr. Darnelle Catalan started hormonal treatment. She is not going to receive chemotherapy. I recommended that she call Z. followup with Dr. Dayton Scrape for possible radiation treatment although I think that with negative margins, hormonal treatment, and her age, that he will most likely not offer her radiation therapy.    Plan:     I recommended to be self breast exam and continued clinical exam with surgery, medical oncology, and radiation oncology. I recommended follow up with her radiation oncologist. I also recommended and placed in order for left breast mammogram in 6 months for a new baseline mammogram and she will follow up at this time.

## 2011-06-12 ENCOUNTER — Other Ambulatory Visit: Payer: Self-pay | Admitting: *Deleted

## 2011-06-12 MED ORDER — LETROZOLE 2.5 MG PO TABS: 2.5000 mg | ORAL_TABLET | Freq: Every day | ORAL | Status: DC

## 2011-06-16 ENCOUNTER — Encounter: Payer: Self-pay | Admitting: Radiation Oncology

## 2011-06-16 NOTE — Progress Notes (Signed)
78 year old female. Resident of [place]. Retired Producer, television/film/video. Menarche age 3. GX P0. Menopause in 1992. Took hormone replacement approximately 8 years, stopping about 10 years ago.   Magrinat prescribed letrozole on 06/03/2011.   NKDA No hx of radiation therapy No indication of a pacemaker

## 2011-06-17 ENCOUNTER — Ambulatory Visit
Admission: RE | Admit: 2011-06-17 | Discharge: 2011-06-17 | Disposition: A | Discharge status: Home / Self Care | Payer: Medicare Other | Source: Ambulatory Visit | Attending: Radiation Oncology | Admitting: Radiation Oncology

## 2011-06-17 ENCOUNTER — Encounter: Payer: Self-pay | Admitting: Radiation Oncology

## 2011-06-17 VITALS — BP 141/76 | HR 94 | Temp 97.5°F | Resp 18 | Ht 60.0 in | Wt 189.7 lb

## 2011-06-17 DIAGNOSIS — C50419 Malignant neoplasm of upper-outer quadrant of unspecified female breast: Secondary | ICD-10-CM | POA: Diagnosis not present

## 2011-06-17 DIAGNOSIS — Z170 Estrogen receptor positive status [ER+]: Secondary | ICD-10-CM | POA: Diagnosis not present

## 2011-06-17 DIAGNOSIS — Z79899 Other long term (current) drug therapy: Secondary | ICD-10-CM | POA: Diagnosis not present

## 2011-06-17 DIAGNOSIS — Z809 Family history of malignant neoplasm, unspecified: Secondary | ICD-10-CM | POA: Diagnosis not present

## 2011-06-17 DIAGNOSIS — C50919 Malignant neoplasm of unspecified site of unspecified female breast: Secondary | ICD-10-CM

## 2011-06-17 HISTORY — DX: Malignant neoplasm of unspecified site of unspecified female breast: C50.919

## 2011-06-17 NOTE — Progress Notes (Signed)
Encounter addended by: Ardell Isaacs, RN on: 06/17/2011  4:19 PM<BR>     Documentation filed: Charges VN

## 2011-06-17 NOTE — Progress Notes (Signed)
Please see the Nurse Progress Note in the MD Initial Consult Encounter for this patient. 

## 2011-06-17 NOTE — Progress Notes (Signed)
Patient presents to the clinic today for a new consultation with Dr. Dayton Scrape reference breast ca. Patient accompanied today by her husband. Patient is alert and oriented to person, place, and time. No distress noted. Steady gait noted. Pleasant affect noted. Patient denies pain. Patient denies nausea, vomiting, diarrhea, headache or dizziness. Patient reports that she began taking letrozole on June 05, 2011. Patient scheduled to follow up with Dr. Darrall Dears PA on July 15, 2011. Reported all finding to Dr. Dayton Scrape.

## 2011-06-17 NOTE — Progress Notes (Signed)
Complete PATIENT MEASURE OF DISTRESS worksheet with a score of 0 submitted to social work. Also, complete NUTRITION RISK SCREEN worksheet without concern submitted to Zenovia Jarred, RD.

## 2011-06-17 NOTE — Progress Notes (Signed)
Followup note:  Diagnosis pathologic stage I (T1, N0, M0) invasive ductal carcinoma of the left breast  The patient is seen today for followup visit, having undergone a left partial mastectomy by Dr. Biagio Quint on 05/04/2011. She is to have a 1.6 cm invasive ductal carcinoma. Tumor was present at the anterior margin and 1 mm from the inferior and posterior margins. She did not have a lymph node sampling. She underwent reexcision on 05/28/2011 and there was no evidence for residual carcinoma. As mentioned previously, her primary tumor was strongly ER/PR positive with a low proliferation marker Ki-67 of 10%. She started on letrozole by Dr. Darnelle Catalan on February 8. She is without complaints today.  Physical examination: Head and neck examination grossly unremarkable. Nodes: Without palpable cervical, supraclavicular, or axillary lymphadenopathy. Chest: Lungs clear. Heart: Regular in rhythm. Breast: There is a partial mastectomy wound along the lower outer quadrant of left breast extending from 3 to 4:00. The wound is well-healed. No masses are appreciated. Right breast without masses or lesions. Extremities without edema.  Impression: Pathologic stage I (T1, N0, M0) invasive ductal carcinoma of the left breast. Considering her pathologic stage, age, and hormone receptor positivity, feel that she would be best managed by adjuvant hormone therapy alone. I do not feel that there is any benefit to adjuvant radiation therapy based on the Hyman Bower 6295 over age 65 breast trial.  Plan: Adjuvant hormone therapy alone. She has been started on letrozole. Understand that she will see Dr. Darrall Dears PA on 07/15/2011.  30 minutes was spent face-to-face with the patient and her husband, primarily counseling the patient.

## 2011-06-18 DIAGNOSIS — E785 Hyperlipidemia, unspecified: Secondary | ICD-10-CM | POA: Diagnosis not present

## 2011-06-18 DIAGNOSIS — I10 Essential (primary) hypertension: Secondary | ICD-10-CM | POA: Diagnosis not present

## 2011-07-15 ENCOUNTER — Encounter: Payer: Self-pay | Admitting: Physician Assistant

## 2011-07-15 ENCOUNTER — Telehealth: Payer: Self-pay | Admitting: *Deleted

## 2011-07-15 ENCOUNTER — Ambulatory Visit (HOSPITAL_BASED_OUTPATIENT_CLINIC_OR_DEPARTMENT_OTHER): Payer: Medicare Other | Admitting: Physician Assistant

## 2011-07-15 VITALS — BP 116/68 | HR 97 | Temp 98.3°F | Ht 60.0 in | Wt 188.4 lb

## 2011-07-15 DIAGNOSIS — C50919 Malignant neoplasm of unspecified site of unspecified female breast: Secondary | ICD-10-CM | POA: Diagnosis not present

## 2011-07-15 DIAGNOSIS — Z170 Estrogen receptor positive status [ER+]: Secondary | ICD-10-CM | POA: Diagnosis not present

## 2011-07-15 DIAGNOSIS — Z79811 Long term (current) use of aromatase inhibitors: Secondary | ICD-10-CM | POA: Diagnosis not present

## 2011-07-15 NOTE — Telephone Encounter (Signed)
gave patient appointment for 12-2011 with the labs week before the doctor appointments

## 2011-07-15 NOTE — Progress Notes (Signed)
ID: REIS PIENTA   DOB: 08-29-1927  MR#: [id number]  IEP#:[id number]  HISTORY OF PRESENT ILLNESS: The patient is an 78 year old [place] woman who had routine yearly screening mammography at [facility] 03/23/2011. A possible area of distortion was noted in the upper outer quadrant of the left breast and she was recalled for additional views November 29. There was a stellate mass in the outer lower quadrant of the left breast which appears more prominent. By ultrasound there was a suggestion of a 1 cm mass at the site of the scar.    BSGI was performed 03/31/2011 1 showed a focal intense isotopic area of activity measuring 1.6 cm at the site in question. Biopsy of this area was performed 04/07/2011 and showed [phone]) and invasive ductal carcinoma, grade 2, which was estrogen receptor positive at 100%, progesterone receptor positive at 69%, with an MIB-1-1 of 10%, and no HER-2 amplification.   The patient had her left lumpectomy, 05/06/2011. This showed multiple positive margins, so on 05/28/2011 she went back of to the operating table for margin clearance. This was successfully accomplished.   Now on letrozole since early February 2013.  INTERVAL HISTORY: Jalisha returns today accompanied by her husband, "Cain Saupe.", for followup of her breast carcinoma. She has been on letrozole now for 6 weeks since early February 2013 is tolerating it well. Interval history is otherwise unremarkable.  REVIEW OF SYSTEMS: Physically, Cass has no new complaints. She has some known arthritis in her shoulders although this has not worsened or changed. She's had no increased joint pain since beginning the letrozole. She has occasional hot flashes, nothing problematic. She's had no fevers, chills, or night sweats. No nausea or emesis and no change in bowel habits. No shortness of breath. She continues to have some mild anxiety which is stable.  A detailed review of systems is otherwise noncontributory.   PAST MEDICAL  HISTORY: Past Medical History  Diagnosis Date  . Hiatal hernia   . Diverticulitis   . PONV (postoperative nausea and vomiting)   . Hypertension     takes Amlodipine daily  . Hyperlipidemia     takes Zocor daily  . Shortness of breath     with exertion  . Arthritis     both shoulders  . Joint pain     both shoulders  . Hemorrhoids     internal  . Diverticulosis   . Urinary incontinence   . Nocturia   . Urinary frequency   . Sinus drainage   . Cataracts, bilateral   . Cancer     left breast  . Nasal congestion   . Breast cancer     PAST SURGICAL HISTORY: Past Surgical History  Procedure Date  . Left breast biopsy 1984  . Right breast biopsy 1993  . Bilateral knee replacements 2008  . Hemorrhoid surgery     outpatient "done in doctor's office"  . Colonoscopy   . Esophagogastroduodenoscopy   . Breast biopsy 05/04/2011    Procedure: BREAST BIOPSY WITH NEEDLE LOCALIZATION;  Surgeon: Rulon Abide, DO;  Location: [facility] OR;  Service: General;  Laterality: Left;  . Joint replacement 2008    bilateral knee replacement    FAMILY HISTORY Family History  Problem Relation Age of Onset  . Cancer Mother     pancreatic  . Cancer Father     esophageal  . Cancer Sister     ovarian  . Cancer Sister     lung  . Cancer Brother  kidney  . Cancer Sister     breast cancer  . Anesthesia problems Neg Hx   . Hypotension Neg Hx   . Malignant hyperthermia Neg Hx   . Pseudochol deficiency Neg Hx     Gynecologic history: GX P0. Menarche age 59, menopause in 37. She took hormone replacement approximately 8 years, stopping about 10 years ago. She had no complications from that treatment.   Social history: She used to work in a [employer], but is now retired. Her husband "S. L." Used to work in a Insurance claims handler. As noted, they have no children, and live by themselves. The patient drives, does all her cooking and a house chores, and is normally active for an 78 year old. She  does not exercise regularly.  ADVANCED DIRECTIVES: No advanced directives in place   HEALTH MAINTENANCE: History  Substance Use Topics  . Smoking status: Former Smoker -- 0.2 packs/day for 10 years    Types: Cigarettes    Quit date: 05/21/1971  . Smokeless tobacco: Never Used   Comment: quit in the 60's  . Alcohol Use: No     Colonoscopy:  PAP:  Bone density:  Lipid panel:  No Known Allergies  Current Outpatient Prescriptions  Medication Sig Dispense Refill  . alendronate (FOSAMAX) 70 MG tablet Take 70 mg by mouth every 7 (seven) days. Tuesday ; Take with a full glass of water on an empty stomach.      Marland Kitchen amLODipine (NORVASC) 5 MG tablet Take 5 mg by mouth daily.        Marland Kitchen aspirin 81 MG tablet Take 81 mg by mouth daily.        Marland Kitchen letrozole (FEMARA) 2.5 MG tablet Take 1 tablet (2.5 mg total) by mouth daily.  30 tablet  11  . simvastatin (ZOCOR) 20 MG tablet Take 20 mg by mouth at bedtime.       . valsartan-hydrochlorothiazide (DIOVAN-HCT) 320-25 MG per tablet Take 1 tablet by mouth daily.       . vitamin B-12 (CYANOCOBALAMIN) 1000 MCG tablet Take 1,000 mcg by mouth daily.       . Calcium Carbonate-Vitamin D (CALTRATE 600+D) 600-400 MG-UNIT per tablet Take 1 tablet by mouth 2 (two) times daily.        . cholecalciferol (VITAMIN D) 400 UNITS TABS Take 400 Units by mouth daily. Take in addition to Caltrate with Vitamin D to get 1200 units of Vitamin D      . fish oil-omega-3 fatty acids 1000 MG capsule Take 1 g by mouth daily.       . magnesium gluconate (MAGONATE) 500 MG tablet Take 500 mg by mouth 2 (two) times daily.       . vitamin C (ASCORBIC ACID) 500 MG tablet Take 500 mg by mouth daily.         OBJECTIVE: Filed Vitals:   07/15/11 1330  BP: 116/68  Pulse: 97  Temp: 98.3 F (36.8 C)     Body mass index is 36.79 kg/(m^2).    ECOG FS: 0  Physical Exam: HEENT:  Sclerae anicteric, conjunctivae pink.  Oropharynx clear.  No mucositis or candidiasis.   Nodes:  No cervical,  supraclavicular, or axillary lymphadenopathy palpated.  Breast Exam:  Right breast is benign, no masses, skin changes, or nipple inversion. Left breast status post lumpectomy, slightly tender to palpation but with no suspicious nodularity, skin changes, or evidence of recurrence. Lungs:  Clear to auscultation bilaterally.  No crackles, rhonchi, or wheezes.  Heart:  Regular rate and rhythm.   Abdomen:  Soft, obese, nontender.  Positive bowel sounds.  No organomegaly or masses palpated.   Musculoskeletal: Mild kyphosis noted on exam. No focal spinal tenderness to palpation.  Extremities:  Benign.  No peripheral edema or cyanosis.   Skin:  Benign.   Neuro:  Nonfocal. Alert and oriented x3.    LAB RESULTS: Lab Results  Component Value Date   WBC 8.0 04/30/2011   NEUTROABS 3.7 04/15/2011   HGB 12.1 05/28/2011   HCT 43.7 04/30/2011   MCV 94.8 04/30/2011   PLT 227 04/30/2011      Chemistry      Component Value Date/Time   NA 138 05/26/2011 1016   K 4.8 05/26/2011 1016   CL 100 05/26/2011 1016   CO2 29 05/26/2011 1016   BUN 20 05/26/2011 1016   CREATININE 0.75 05/26/2011 1016      Component Value Date/Time   CALCIUM 9.2 05/26/2011 1016   ALKPHOS 52 04/15/2011 0829   AST 13 04/15/2011 0829   ALT 10 04/15/2011 0829   BILITOT 0.3 04/15/2011 0829       STUDIES: Most recent bone density at [facility] in November 2012 showed osteopenia.  ASSESSMENT: 78 year old [place] woman   (1)  status post left lumpectomy with subsequent margin clearance January of 2013 for a T1c NX, (stage I) invasive ductal carcinoma, grade 2, strongly estrogen and progesterone receptor positive, with a low MIB-1, and no HER-2 amplification.   (2) on letrozole, 2.5 mg daily, since early February 2013.    PLAN: With regards to her breast cancer, Ameshia seems to be doing well, and will continue on letrozole. I will note that she's been paying $200 a month for the drug, and we counseled her today to call around for price  comparisons. She understands that she should not be paying more than $15 per month, and if she is, she should let us know.  We will see Charlot every 6 months for the first 2 years, then annually. She voices understanding and agreement with our plan, and is to call any changes or problems.  Chananya Canizalez    07/15/2011

## 2011-08-17 ENCOUNTER — Telehealth (INDEPENDENT_AMBULATORY_CARE_PROVIDER_SITE_OTHER): Payer: Self-pay | Admitting: General Surgery

## 2011-08-17 ENCOUNTER — Other Ambulatory Visit: Payer: Self-pay | Admitting: *Deleted

## 2011-08-17 ENCOUNTER — Telehealth: Payer: Self-pay | Admitting: *Deleted

## 2011-08-17 NOTE — Telephone Encounter (Signed)
Patient called and left a message that she wants to see the doctor as soon as possible. I have forwarded the message to the desk RN.  JMW

## 2011-08-17 NOTE — Telephone Encounter (Signed)
Pt called stating concern due to noted "knot in my left breast".  Per further inquiry - Kathy [name] states knot appeared approximately 2 weeks ago and she was monitoring. Then last week knot became tender and "seems to be getting larger".  Pt denies painful though noted " tender ". Not hot to touch or reddened.  Skin is smooth but knot is hard " and it seems like it has some ridges ".  This note will be reviewed with MD for appropriate follow up.

## 2011-08-17 NOTE — Telephone Encounter (Signed)
Pt calling to report new, hard lump in breast, seems to be getting larger.  Not sure if surgeon needs to see her or where to start.  Recommended she start with Dr. Tami Lin in the breast center, where they have imaging available.  She understands and will call them now.

## 2011-08-18 ENCOUNTER — Encounter (INDEPENDENT_AMBULATORY_CARE_PROVIDER_SITE_OTHER): Payer: Self-pay | Admitting: Surgery

## 2011-08-18 ENCOUNTER — Ambulatory Visit (HOSPITAL_BASED_OUTPATIENT_CLINIC_OR_DEPARTMENT_OTHER): Payer: Medicare Other | Admitting: Oncology

## 2011-08-18 ENCOUNTER — Ambulatory Visit (INDEPENDENT_AMBULATORY_CARE_PROVIDER_SITE_OTHER): Payer: Medicare Other | Admitting: General Surgery

## 2011-08-18 VITALS — BP 116/72 | HR 98 | Temp 98.5°F | Ht 60.0 in | Wt 188.1 lb

## 2011-08-18 VITALS — BP 133/64 | HR 112 | Temp 98.2°F | Ht 60.0 in | Wt 187.4 lb

## 2011-08-18 DIAGNOSIS — N6489 Other specified disorders of breast: Secondary | ICD-10-CM | POA: Diagnosis not present

## 2011-08-18 DIAGNOSIS — C50519 Malignant neoplasm of lower-outer quadrant of unspecified female breast: Secondary | ICD-10-CM | POA: Diagnosis not present

## 2011-08-18 DIAGNOSIS — IMO0002 Reserved for concepts with insufficient information to code with codable children: Secondary | ICD-10-CM

## 2011-08-18 DIAGNOSIS — Z170 Estrogen receptor positive status [ER+]: Secondary | ICD-10-CM | POA: Diagnosis not present

## 2011-08-18 DIAGNOSIS — C50919 Malignant neoplasm of unspecified site of unspecified female breast: Secondary | ICD-10-CM

## 2011-08-18 NOTE — Progress Notes (Signed)
ID: Kathy [name]   DOB: 1928/01/01  MR#: [id number]  CSN#:[id number]  HISTORY OF PRESENT ILLNESS: The patient is an 78 year old [place] woman who had routine yearly screening mammography at [facility] 03/23/2011. A possible area of distortion was noted in the upper outer quadrant of the left breast and she was recalled for additional views November 29. There was a stellate mass in the outer lower quadrant of the left breast which appears more prominent. By ultrasound there was a suggestion of a 1 cm mass at the site of the scar.   BSGI was performed 03/31/2011 1 showed a focal intense isotopic area of activity measuring 1.6 cm at the site in question. Biopsy of this area was performed 04/07/2011 and showed [phone]) and invasive ductal carcinoma, grade 2, which was estrogen receptor positive at 100%, progesterone receptor positive at 69%, with an MIB-1-1 of 10%, and no HER-2 amplification.   The patient had her left lumpectomy, 05/06/2011. This showed multiple positive margins, so on 05/28/2011 she went back of to the operating table for margin clearance. This was successfully accomplished.   Her subsequent history is as detailed below.  INTERVAL HISTORY: Kathy [name] returns today accompanied by her husband, "Cain Saupe.", for evaluation of a new "mass" that has come on in her Left breast. There has been no intercurrent trauma, unusual activity or exercise, or other precipitating cause that she is aware of.   REVIEW OF SYSTEMS: A detailed review of systems is otherwise unremarkable. She is tolerating the letrozole without any side effects she is aware of and is not paying less than $20 for 3 months (vrs >$200 previously). She has a fairly permanent a runny nose, her dentures don't quite fit, she has some heartburn and some urinary leakage. She has some low back and joint pain. None of these are new issues. She does have some mild hot flashes and that might be due to letrozole but it is not a major problem for  her  PAST MEDICAL HISTORY: Past Medical History  Diagnosis Date  . Hiatal hernia   . Diverticulitis   . PONV (postoperative nausea and vomiting)   . Hypertension     takes Amlodipine daily  . Hyperlipidemia     takes Zocor daily  . Shortness of breath     with exertion  . Arthritis     both shoulders  . Joint pain     both shoulders  . Hemorrhoids     internal  . Diverticulosis   . Urinary incontinence   . Nocturia   . Urinary frequency   . Sinus drainage   . Cataracts, bilateral   . Cancer     left breast  . Nasal congestion   . Breast cancer     PAST SURGICAL HISTORY: Past Surgical History  Procedure Date  . Left breast biopsy 1984  . Right breast biopsy 1993  . Bilateral knee replacements 2008  . Hemorrhoid surgery     outpatient "done in doctor's office"  . Colonoscopy   . Esophagogastroduodenoscopy   . Breast biopsy 05/04/2011    Procedure: BREAST BIOPSY WITH NEEDLE LOCALIZATION;  Surgeon: Rulon Abide, DO;  Location: [facility] OR;  Service: General;  Laterality: Left;  . Joint replacement 2008    bilateral knee replacement    FAMILY HISTORY Family History  Problem Relation Age of Onset  . Cancer Mother     pancreatic  . Cancer Father     esophageal  . Cancer Sister  ovarian  . Cancer Sister     lung  . Cancer Brother     kidney  . Cancer Sister     breast cancer  . Anesthesia problems Neg Hx   . Hypotension Neg Hx   . Malignant hyperthermia Neg Hx   . Pseudochol deficiency Neg Hx   The patient's father died from stomach or esophageal cancer at the age of 84. The patient's mother died from pancreas cancer at the age of 70 the patient has 4 sisters and 4 brothers. One brother died from kidney cancer and the other 3 from heart disease. One sister died of ovarian cancer which was diagnosed in her 27s. One sister died from "female organ cancer" diagnosed in her 57s. One sister died from lung cancer. The other surviving sister, currently 24, was just  diagnosed with breast cancer.    Gynecologic history: GX P0. Menarche age 83, menopause in 46. She took hormone replacement approximately 8 years, stopping about 10 years ago. She had no complications from that treatment.   Social history: She used to work in a [employer], but is now retired. Her husband "S. L." Used to work in a Insurance claims handler. As noted, they have no children, and live by themselves. The patient drives, does all her cooking and a house chores, and is normally active for an 78 year old. She does not exercise regularly.  ADVANCED DIRECTIVES: No advanced directives in place   HEALTH MAINTENANCE: History  Substance Use Topics  . Smoking status: Former Smoker -- 0.2 packs/day for 10 years    Types: Cigarettes    Quit date: 05/21/1971  . Smokeless tobacco: Never Used   Comment: quit in the 60's  . Alcohol Use: No   Colonoscopy: 2006  PAP: 2000  Bone density: NOV 2012, lowest reading = T -1.2  Cholesterol: controlled w. Medication.     No Known Allergies  Current Outpatient Prescriptions  Medication Sig Dispense Refill  . alendronate (FOSAMAX) 70 MG tablet Take 70 mg by mouth every 7 (seven) days. Tuesday ; Take with a full glass of water on an empty stomach.      Marland Kitchen amLODipine (NORVASC) 5 MG tablet Take 5 mg by mouth daily.        Marland Kitchen aspirin 81 MG tablet Take 81 mg by mouth daily.        . cholecalciferol (VITAMIN D) 400 UNITS TABS Take 400 Units by mouth daily. Take in addition to Caltrate with Vitamin D to get 1200 units of Vitamin D      . fish oil-omega-3 fatty acids 1000 MG capsule Take 1 g by mouth daily.       Marland Kitchen letrozole (FEMARA) 2.5 MG tablet Take 1 tablet (2.5 mg total) by mouth daily.  30 tablet  11  . simvastatin (ZOCOR) 20 MG tablet Take 20 mg by mouth at bedtime.       . valsartan-hydrochlorothiazide (DIOVAN-HCT) 320-25 MG per tablet Take 1 tablet by mouth daily.       . Calcium Carbonate-Vitamin D (CALTRATE 600+D) 600-400 MG-UNIT per tablet Take 1  tablet by mouth 2 (two) times daily.          OBJECTIVE: elderly white woman in no acute distress Filed Vitals:   08/18/11 1148  BP: 116/72  Pulse: 98  Temp: 98.5 F (36.9 C)     Body mass index is 36.74 kg/(m^2).    ECOG FS: 1  Physical Exam: HEENT:  Sclerae anicteric, conjunctivae pink.  Oropharynx clear.  No mucositis or candidiasis.   Nodes:  No cervical, supraclavicular, or axillary lymphadenopathy palpated.  Breast Exam:  Right breast is benign, no masses, skin changes, or nipple inversion. Left breast status post lumpectomy; there is a large firm movable mass in the left breast, consistent with a seroma or hematoma, measuring approximately 7 cm in diameter. There is no associated bruising or ecchymosis, no nipple retraction, and no skin changes.. Lungs:  Clear to auscultation bilaterally.  No crackles, rhonchi, or wheezes.   Heart:  Regular rate and rhythm.   Abdomen:  Soft, obese, nontender.  Positive bowel sounds.  No organomegaly or masses palpated.   Musculoskeletal: Mild kyphosis noted on exam. No focal spinal tenderness to palpation.  Neuro:  Nonfocal. Alert and oriented x3.  LAB RESULTS: Lab Results  Component Value Date   WBC 8.0 04/30/2011   NEUTROABS 3.7 04/15/2011   HGB 12.1 05/28/2011   HCT 43.7 04/30/2011   MCV 94.8 04/30/2011   PLT 227 04/30/2011      Chemistry      Component Value Date/Time   NA 138 05/26/2011 1016   K 4.8 05/26/2011 1016   CL 100 05/26/2011 1016   CO2 29 05/26/2011 1016   BUN 20 05/26/2011 1016   CREATININE 0.75 05/26/2011 1016      Component Value Date/Time   CALCIUM 9.2 05/26/2011 1016   ALKPHOS 52 04/15/2011 0829   AST 13 04/15/2011 0829   ALT 10 04/15/2011 0829   BILITOT 0.3 04/15/2011 0829       STUDIES: Most recent bone density at [facility] in November 2012 showed osteopenia. Repeat mammography due November 2013  ASSESSMENT: 78 year old [place] woman   (1)  status post left lumpectomy with subsequent margin clearance January of  2013 for a T1c NX, (stage I) invasive ductal carcinoma, grade 2, strongly estrogen and progesterone receptor positive, with a low MIB-1, and no HER-2 amplification.   (2) on letrozole, 2.5 mg daily, since early February 2013.    PLAN: She tells me she started feeling a "sloshing" in her breast in February. I don't remember her mentioning this before, but she certainly does. At any rate she has a large I think seroma or hematoma in her left breast. I am not sure why it would manifest itself now rather than closer to the time of her surgery.  I am asking the surgeons to evaluate her today and have made her an appointment this afternoon at [facility]. This is likely to need drainage, and the question of course is whether or not it will reaccumulate. Assuming this problem is taking care of, she will return to see [place] in September, but of course we will see her earlier than that if the need arises.  Javiana Anwar C    08/18/2011

## 2011-08-18 NOTE — Progress Notes (Signed)
Subjective:     Patient ID: Kathy [name], female   DOB: [birth year], 78 y.o.   MRN: [id number]  HPI This patient is 3 months status post left breast lumpectomy for left breast cancer. She has done well since her surgery and is on hormonal treatment currently. She recently has noticed some swelling and sloshing around of her left breast and she was seen by her oncologist today for evaluation and he referred her back over here for evaluation. She denies any significant discomfort from this swelling denies any redness or fevers or chills.  Review of Systems     Objective:   Physical Exam She has a large area of swelling approximately 8-10 cm in diameter in her left breast and high frequency ultrasound was performed at the bedside which demonstrated a fluid-filled cavity of the palpable dimensions.    Assessment:     Left breast seroma I think that this is most likely a left breast seroma but I am unsure why this is certainly appeared 3 months out from her surgery. I doubt that this is a hematoma given the absence of trauma and now we're this far out from surgery. It is nontender and I do not suspect any recurrent tumor. I discussed with her the options for aspiration versus surveillance of the risks and benefits of each. We will set her up for a formal ultrasound and they'll discuss results with her after that. I'm not sure that we need to aspirate this as this is currently minimally symptomatic. It    Plan:     We will get a formal ultrasound and discuss the results after this. If we decide to not aspirate this and does follow this, this should improve with time although it will be slow. I will see her back in one to 2 months.

## 2011-08-26 DIAGNOSIS — Z09 Encounter for follow-up examination after completed treatment for conditions other than malignant neoplasm: Secondary | ICD-10-CM | POA: Diagnosis not present

## 2011-09-14 ENCOUNTER — Encounter (INDEPENDENT_AMBULATORY_CARE_PROVIDER_SITE_OTHER): Payer: Self-pay

## 2011-10-22 ENCOUNTER — Ambulatory Visit (INDEPENDENT_AMBULATORY_CARE_PROVIDER_SITE_OTHER): Payer: Medicare Other | Admitting: General Surgery

## 2011-10-22 VITALS — BP 128/76 | Temp 97.4°F | Resp 18 | Ht 60.0 in | Wt 188.0 lb

## 2011-10-22 DIAGNOSIS — N6489 Other specified disorders of breast: Secondary | ICD-10-CM | POA: Diagnosis not present

## 2011-10-22 NOTE — Progress Notes (Signed)
Subjective:     Patient ID: Kathy [name], female   DOB: 03-19-1928, 78 y.o.   MRN: [id number]  HPI Kathy [name] follows up for evaluation of a left breast hematoma/seroma which was a result of her left breast lumpectomy for breast cancer back in January. She underwent reexcision of margins and came back with firm swelling in her left breast which has persisted. She says that this is smaller than it was on her last visit but it still is asymptomatic and does not cause any problems for her. She denies any fevers or chills or redness. She did have a formal ultrasound after her last visit which demonstrated a 9.6 cm fluid collection consistent with a hematoma or seroma.  Review of Systems     Objective:   Physical Exam She has persistence of her left breast swelling which has softened up considerably. Bedside ultrasound confirms a fluid collection in the left breast which measures smaller today at about 7 cm from AP dimensions. There is no suspicious masses. There is no erythema or tenderness or sign of infection.    Assessment:     Left breast cancer-doing well Left breast hematoma/seroma-slowly improving She still has persistence of her seroma but this does seem to be softening up and getting smaller. It is still asymptomatic and there is no evidence of infection and we discussed options for aspiration and drainage again today but since this is asymptomatic for she is not interested in any further treatment.    Plan:     We will continue with observation and this should continue to improve although slowly. I will see her back in another 3 months for repeat evaluation.

## 2011-12-18 DIAGNOSIS — E785 Hyperlipidemia, unspecified: Secondary | ICD-10-CM | POA: Diagnosis not present

## 2011-12-18 DIAGNOSIS — I491 Atrial premature depolarization: Secondary | ICD-10-CM | POA: Diagnosis not present

## 2011-12-18 DIAGNOSIS — I10 Essential (primary) hypertension: Secondary | ICD-10-CM | POA: Diagnosis not present

## 2011-12-29 ENCOUNTER — Other Ambulatory Visit (HOSPITAL_BASED_OUTPATIENT_CLINIC_OR_DEPARTMENT_OTHER): Payer: Medicare Other | Admitting: Lab

## 2011-12-29 DIAGNOSIS — C50919 Malignant neoplasm of unspecified site of unspecified female breast: Secondary | ICD-10-CM

## 2011-12-29 LAB — COMPREHENSIVE METABOLIC PANEL (CC13)
Alkaline Phosphatase: 55 U/L (ref 40–150)
BUN: 18 mg/dL (ref 7.0–26.0)
CO2: 25 mEq/L (ref 22–29)
Creatinine: 0.9 mg/dL (ref 0.6–1.1)
Glucose: 117 mg/dl — ABNORMAL HIGH (ref 70–99)
Total Bilirubin: 0.5 mg/dL (ref 0.20–1.20)

## 2011-12-29 LAB — CBC WITH DIFFERENTIAL/PLATELET
Eosinophils Absolute: 0.2 10*3/uL (ref 0.0–0.5)
HCT: 38.7 % (ref 34.8–46.6)
LYMPH%: 34.3 % (ref 14.0–49.7)
MCV: 94.4 fL (ref 79.5–101.0)
MONO%: 9.4 % (ref 0.0–14.0)
NEUT#: 3.1 10*3/uL (ref 1.5–6.5)
NEUT%: 53.1 % (ref 38.4–76.8)
Platelets: 217 10*3/uL (ref 145–400)
RBC: 4.1 10*6/uL (ref 3.70–5.45)

## 2012-01-05 ENCOUNTER — Other Ambulatory Visit: Payer: Medicare Other | Admitting: Lab

## 2012-01-05 ENCOUNTER — Encounter: Payer: Self-pay | Admitting: Physician Assistant

## 2012-01-05 ENCOUNTER — Ambulatory Visit (HOSPITAL_BASED_OUTPATIENT_CLINIC_OR_DEPARTMENT_OTHER): Payer: Medicare Other | Admitting: Physician Assistant

## 2012-01-05 ENCOUNTER — Telehealth: Payer: Self-pay | Admitting: *Deleted

## 2012-01-05 VITALS — BP 121/69 | HR 112 | Temp 98.2°F | Resp 20 | Ht 60.0 in | Wt 189.0 lb

## 2012-01-05 DIAGNOSIS — Z170 Estrogen receptor positive status [ER+]: Secondary | ICD-10-CM

## 2012-01-05 DIAGNOSIS — C50519 Malignant neoplasm of lower-outer quadrant of unspecified female breast: Secondary | ICD-10-CM

## 2012-01-05 DIAGNOSIS — C50919 Malignant neoplasm of unspecified site of unspecified female breast: Secondary | ICD-10-CM

## 2012-01-05 DIAGNOSIS — M8580 Other specified disorders of bone density and structure, unspecified site: Secondary | ICD-10-CM

## 2012-01-05 DIAGNOSIS — M899 Disorder of bone, unspecified: Secondary | ICD-10-CM

## 2012-01-05 DIAGNOSIS — M949 Disorder of cartilage, unspecified: Secondary | ICD-10-CM | POA: Diagnosis not present

## 2012-01-05 NOTE — Patient Instructions (Signed)
Continue letrozole (Femara) daily.  See Dr. Biagio Quint later this month as scheduled.  Mammogram at [facility] in December.  Return for labs and follow up with Dr. Darnelle Catalan in Jan or Feb 2014.

## 2012-01-05 NOTE — Progress Notes (Signed)
ID: Kathy [name]   DOB: 05/24/1927  MR#: [id number]  CSN#:[id number]  HISTORY OF PRESENT ILLNESS: The patient is an 78 year old [place] woman who had routine yearly screening mammography at [facility] 03/23/2011. A possible area of distortion was noted in the upper outer quadrant of the left breast and she was recalled for additional views November 29. There was a stellate mass in the outer lower quadrant of the left breast which appears more prominent. By ultrasound there was a suggestion of a 1 cm mass at the site of the scar.   BSGI was performed 03/31/2011 1 showed a focal intense isotopic area of activity measuring 1.6 cm at the site in question. Biopsy of this area was performed 04/07/2011 and showed [phone]) and invasive ductal carcinoma, grade 2, which was estrogen receptor positive at 100%, progesterone receptor positive at 69%, with an MIB-1-1 of 10%, and no HER-2 amplification.   The patient had her left lumpectomy, 05/06/2011. This showed multiple positive margins, so on 05/28/2011 she went back of to the operating table for margin clearance. This was successfully accomplished.   Her subsequent history is as detailed below.  INTERVAL HISTORY: Kathy [name] returns today for routine followup of her left breast carcinoma. She continues on letrozole 2.5 mg daily which she tolerates well. Interval history is remarkable for a continued seroma in the left breast. This was evaluated by ultrasound following her last visit here, and has been evaluated by her surgeon, Dr. Biagio Quint.  Kathy [name] tells me it does seem to be shrinking slowly. It certainly is no larger. She scheduled to followup with the surgeon again later this month.  Otherwise interval history is unremarkable. Kathy [name] is staying busy at home, helping take care of her husband who is a diabetic.  REVIEW OF SYSTEMS: Kathy [name] has had no fevers or chills. She has occasional hot flashes, but nothing that is problematic. She's eating and drinking well  denies any nausea or change in bowel habits. She's had no cough, phlegm production, or increased shortness of breath. She has a runny nose and some postnasal drip. She's had no chest pain or palpitations. No abnormal headaches or dizziness. She has some arthritis in her shoulders and lower back, none of which are new or different. She's had no peripheral swelling.  A detailed review of systems is otherwise stable and noncontributory.   PAST MEDICAL HISTORY: Past Medical History  Diagnosis Date  . Hiatal hernia   . Diverticulitis   . PONV (postoperative nausea and vomiting)   . Hypertension     takes Amlodipine daily  . Hyperlipidemia     takes Zocor daily  . Shortness of breath     with exertion  . Arthritis     both shoulders  . Joint pain     both shoulders  . Hemorrhoids     internal  . Diverticulosis   . Urinary incontinence   . Nocturia   . Urinary frequency   . Sinus drainage   . Cataracts, bilateral   . Cancer     left breast  . Nasal congestion   . Breast cancer     PAST SURGICAL HISTORY: Past Surgical History  Procedure Date  . Left breast biopsy 1984  . Right breast biopsy 1993  . Bilateral knee replacements 2008  . Hemorrhoid surgery     outpatient "done in doctor's office"  . Colonoscopy   . Esophagogastroduodenoscopy   . Breast biopsy 05/04/2011    Procedure: BREAST BIOPSY WITH NEEDLE LOCALIZATION;  Surgeon:  Rulon Abide, DO;  Location: [facility] OR;  Service: General;  Laterality: Left;  . Joint replacement 2008    bilateral knee replacement    FAMILY HISTORY Family History  Problem Relation Age of Onset  . Cancer Mother     pancreatic  . Cancer Father     esophageal  . Cancer Sister     ovarian  . Cancer Sister     lung  . Cancer Brother     kidney  . Cancer Sister     breast cancer  . Anesthesia problems Neg Hx   . Hypotension Neg Hx   . Malignant hyperthermia Neg Hx   . Pseudochol deficiency Neg Hx   The patient's father died from  stomach or esophageal cancer at the age of 66. The patient's mother died from pancreas cancer at the age of 43 the patient has 4 sisters and 4 brothers. One brother died from kidney cancer and the other 3 from heart disease. One sister died of ovarian cancer which was diagnosed in her 107s. One sister died from "female organ cancer" diagnosed in her 69s. One sister died from lung cancer. The other surviving sister, currently 51, was just diagnosed with breast cancer.    Gynecologic history: GX P0. Menarche age 68, menopause in 89. She took hormone replacement approximately 8 years, stopping about 10 years ago. She had no complications from that treatment.   Social history: She used to work in a [employer], but is now retired. Her husband "S. L." Used to work in a Insurance claims handler. As noted, they have no children, and live by themselves. The patient drives, does all her cooking and a house chores, and is normally active for an 78 year old. She does not exercise regularly.  ADVANCED DIRECTIVES: No advanced directives in place   HEALTH MAINTENANCE: History  Substance Use Topics  . Smoking status: Former Smoker -- 0.2 packs/day for 10 years    Types: Cigarettes    Quit date: 05/21/1971  . Smokeless tobacco: Never Used   Comment: quit in the 60's  . Alcohol Use: No   Colonoscopy: 2006  PAP: 2000  Bone density: NOV 2012, lowest reading = T -1.2  Cholesterol: controlled w. Medication.     No Known Allergies  Current Outpatient Prescriptions  Medication Sig Dispense Refill  . alendronate (FOSAMAX) 70 MG tablet Take 70 mg by mouth every 7 (seven) days. Tuesday ; Take with a full glass of water on an empty stomach.      Marland Kitchen amLODipine (NORVASC) 5 MG tablet Take 5 mg by mouth daily.        Marland Kitchen aspirin 81 MG tablet Take 81 mg by mouth daily.        . Calcium Carbonate-Vitamin D (CALTRATE 600+D) 600-400 MG-UNIT per tablet Take 1 tablet by mouth 2 (two) times daily.        . cholecalciferol (VITAMIN  D) 400 UNITS TABS Take 400 Units by mouth daily. Take in addition to Caltrate with Vitamin D to get 1200 units of Vitamin D      . Cyanocobalamin (VITAMIN B 12 PO) Take 1,000 mcg by mouth daily.      . fish oil-omega-3 fatty acids 1000 MG capsule Take 1 g by mouth daily.       . fluticasone (VERAMYST) 27.5 MCG/SPRAY nasal spray Place 2 sprays into the nose daily.      Marland Kitchen letrozole (FEMARA) 2.5 MG tablet Take 1 tablet (2.5 mg total) by mouth daily.  30 tablet  11  . magnesium gluconate (MAGONATE) 500 MG tablet Take 500 mg by mouth 2 (two) times daily.      . simvastatin (ZOCOR) 20 MG tablet Take 20 mg by mouth at bedtime.       . valsartan-hydrochlorothiazide (DIOVAN-HCT) 320-25 MG per tablet Take 1 tablet by mouth daily.       . vitamin C (ASCORBIC ACID) 500 MG tablet Take 500 mg by mouth daily.        OBJECTIVE: elderly white woman in no acute distress Filed Vitals:   01/05/12 1344  BP: 121/69  Pulse: 112  Temp: 98.2 F (36.8 C)  Resp: 20     Body mass index is 36.91 kg/(m^2).    ECOG FS: 1 Filed Weights   01/05/12 1344  Weight: 189 lb (85.73 kg)   Physical Exam: HEENT:  Sclerae anicteric.  Oropharynx clear.   Nodes:  No cervical, supraclavicular, or axillary lymphadenopathy palpated.  Breast Exam:  Right breast is benign, no masses, skin changes, or nipple inversion. Left breast status post lumpectomy; there remains a large firm movable mass in the left breast, consistent with a seroma,  measuring approximately 6 cm in diameter. There is no associated bruising or ecchymosis, and no tenderness to touch.  No nipple retraction, and no skin changes. Lungs:  Clear to auscultation bilaterally.  No crackles, rhonchi, or wheezes.   Heart:  Regular rate and rhythm.   Abdomen:  Soft, obese, nontender.  Positive bowel sounds.   Musculoskeletal: Mild kyphosis noted on exam. No focal spinal tenderness to palpation.  Neuro:  Nonfocal. Alert and oriented x3.  LAB RESULTS: Lab Results  Component  Value Date   WBC 5.8 12/29/2011   NEUTROABS 3.1 12/29/2011   HGB 13.1 12/29/2011   HCT 38.7 12/29/2011   MCV 94.4 12/29/2011   PLT 217 12/29/2011      Chemistry      Component Value Date/Time   NA 140 12/29/2011 1002   NA 138 05/26/2011 1016   K 3.8 12/29/2011 1002   K 4.8 05/26/2011 1016   CL 105 12/29/2011 1002   CL 100 05/26/2011 1016   CO2 25 12/29/2011 1002   CO2 29 05/26/2011 1016   BUN 18.0 12/29/2011 1002   BUN 20 05/26/2011 1016   CREATININE 0.9 12/29/2011 1002   CREATININE 0.75 05/26/2011 1016      Component Value Date/Time   CALCIUM 9.3 12/29/2011 1002   CALCIUM 9.2 05/26/2011 1016   ALKPHOS 55 12/29/2011 1002   ALKPHOS 52 04/15/2011 0829   AST 12 12/29/2011 1002   AST 13 04/15/2011 0829   ALT 10 12/29/2011 1002   ALT 10 04/15/2011 0829   BILITOT 0.50 12/29/2011 1002   BILITOT 0.3 04/15/2011 0829       STUDIES: Most recent bone density at [facility] in November 2012 showed osteopenia. Repeat mammography due December 2013.  ASSESSMENT: 78 year old [place] woman   (1)  status post left lumpectomy with subsequent margin clearance January of 2013 for a T1c NX, (stage I) invasive ductal carcinoma, grade 2, strongly estrogen and progesterone receptor positive, with a low MIB-1, and no HER-2 amplification.   (2) on letrozole, 2.5 mg daily, since early February 2013.    PLAN: Kathy [name] continues to tolerate the letrozole well and will continue at 2.5 mg daily. She'll continue to followup with her surgeon, Dr. Biagio Quint, with regards to the seroma in her left breast. She scheduled to see him again later this month. She will  be due for her mammogram and we will also obtain a left breast ultrasound in December for further evaluation. The seroma does seem to be decreasing in size slowly. Unless it becomes absolutely necessary, she would prefer not to have the seroma drained.  Otherwise, Kathy [name] will see [place] again in January or February, but knows to call prior to that time with any changes or problems.    Jaimey Franchini    01/05/2012

## 2012-01-05 NOTE — Telephone Encounter (Signed)
04-05-2012 at [facility] for mammogram and ultrasound   05-06-2012 lab only appointment  05-31-2012 at 10:30am md appointment  Patient aware of the all the appointsment

## 2012-01-22 ENCOUNTER — Encounter (INDEPENDENT_AMBULATORY_CARE_PROVIDER_SITE_OTHER): Payer: Self-pay | Admitting: General Surgery

## 2012-01-22 ENCOUNTER — Ambulatory Visit (INDEPENDENT_AMBULATORY_CARE_PROVIDER_SITE_OTHER): Payer: Medicare Other | Admitting: General Surgery

## 2012-01-22 VITALS — BP 124/76 | HR 70 | Temp 96.9°F | Resp 16 | Ht 60.0 in | Wt 186.5 lb

## 2012-01-22 DIAGNOSIS — Z853 Personal history of malignant neoplasm of breast: Secondary | ICD-10-CM | POA: Diagnosis not present

## 2012-01-22 NOTE — Progress Notes (Signed)
Subjective:     Patient ID: Kathy [name], female   DOB: 1927/06/29, 78 y.o.   MRN: [id number]  HPI This patient follows up status post left needle localized lumpectomy with reexcision of margins for a left breast cancer back in January of this year. This was complicated with postoperative seroma and hematoma which has persisted and is slowly decreasing in size. This is asymptomatic and she states it is not causing any discomfort and slowly decreasing in size over time.  She is doing her breast exam and has not noted any changes. She is taking hormonal therapy. She did not have any radiation or chemotherapy due to her age.  Review of Systems     Objective:   Physical Exam Acute distress nontoxic-appearing Her left breast is soft others she still has a notable seroma which is nontender and no evidence of infection. Just medial to her incision is a small, mobile nodule about 1 cm which she says has been present and not changed. This is mobile it is likely scar tissue from her nearby incision. There is no other suspicious masses or lymphadenopathy    Assessment:     Status post left lumpectomy for left breast cancer-doing well Postoperative seroma-improving    Plan:     She seems to be doing well from her procedure and is tolerating her hormonal treatment. She's doing her self breast exam and has not noted any changes. She has a mobile nodule just medial to her prior incision but this was really not suspicious for any recurrent disease. She says that she hasn't noticed this and has been following a without any changes. She is due for a mammogram and ultrasound in December and we will see her back at that time and if there is any change in this nodule we will see her sooner and repeat the ultrasound sooner than December. Otherwise we will be able to evaluate this at that time.

## 2012-02-03 DIAGNOSIS — H524 Presbyopia: Secondary | ICD-10-CM | POA: Diagnosis not present

## 2012-02-03 DIAGNOSIS — H2510 Age-related nuclear cataract, unspecified eye: Secondary | ICD-10-CM | POA: Diagnosis not present

## 2012-02-09 DIAGNOSIS — Z23 Encounter for immunization: Secondary | ICD-10-CM | POA: Diagnosis not present

## 2012-04-05 DIAGNOSIS — Z853 Personal history of malignant neoplasm of breast: Secondary | ICD-10-CM | POA: Diagnosis not present

## 2012-04-19 ENCOUNTER — Encounter (INDEPENDENT_AMBULATORY_CARE_PROVIDER_SITE_OTHER): Payer: Self-pay

## 2012-05-06 ENCOUNTER — Other Ambulatory Visit (HOSPITAL_BASED_OUTPATIENT_CLINIC_OR_DEPARTMENT_OTHER): Payer: Medicare Other | Admitting: Lab

## 2012-05-06 DIAGNOSIS — C50919 Malignant neoplasm of unspecified site of unspecified female breast: Secondary | ICD-10-CM | POA: Diagnosis not present

## 2012-05-06 DIAGNOSIS — M949 Disorder of cartilage, unspecified: Secondary | ICD-10-CM

## 2012-05-06 DIAGNOSIS — C50519 Malignant neoplasm of lower-outer quadrant of unspecified female breast: Secondary | ICD-10-CM | POA: Diagnosis not present

## 2012-05-06 DIAGNOSIS — M899 Disorder of bone, unspecified: Secondary | ICD-10-CM

## 2012-05-06 DIAGNOSIS — M8580 Other specified disorders of bone density and structure, unspecified site: Secondary | ICD-10-CM

## 2012-05-06 LAB — CBC WITH DIFFERENTIAL/PLATELET
Basophils Absolute: 0.1 10*3/uL (ref 0.0–0.1)
EOS%: 1.4 % (ref 0.0–7.0)
HCT: 41.7 % (ref 34.8–46.6)
HGB: 14 g/dL (ref 11.6–15.9)
LYMPH%: 23.7 % (ref 14.0–49.7)
MCH: 31.5 pg (ref 25.1–34.0)
MCHC: 33.6 g/dL (ref 31.5–36.0)
NEUT%: 66 % (ref 38.4–76.8)
Platelets: 199 10*3/uL (ref 145–400)
lymph#: 1.8 10*3/uL (ref 0.9–3.3)

## 2012-05-06 LAB — COMPREHENSIVE METABOLIC PANEL (CC13)
ALT: 7 U/L (ref 0–55)
AST: 11 U/L (ref 5–34)
Alkaline Phosphatase: 58 U/L (ref 40–150)
CO2: 28 mEq/L (ref 22–29)
Creatinine: 1 mg/dL (ref 0.6–1.1)
Sodium: 140 mEq/L (ref 136–145)
Total Bilirubin: 0.9 mg/dL (ref 0.20–1.20)
Total Protein: 6.9 g/dL (ref 6.4–8.3)

## 2012-05-07 LAB — VITAMIN D 25 HYDROXY (VIT D DEFICIENCY, FRACTURES): Vit D, 25-Hydroxy: 52 ng/mL (ref 30–89)

## 2012-05-07 LAB — CANCER ANTIGEN 27.29: CA 27.29: 23 U/mL (ref 0–39)

## 2012-05-12 ENCOUNTER — Encounter (INDEPENDENT_AMBULATORY_CARE_PROVIDER_SITE_OTHER): Payer: Self-pay | Admitting: General Surgery

## 2012-05-12 ENCOUNTER — Ambulatory Visit (INDEPENDENT_AMBULATORY_CARE_PROVIDER_SITE_OTHER): Payer: Medicare Other | Admitting: General Surgery

## 2012-05-12 VITALS — BP 128/80 | HR 61 | Temp 97.1°F | Resp 16 | Ht 60.0 in | Wt 182.4 lb

## 2012-05-12 DIAGNOSIS — Z853 Personal history of malignant neoplasm of breast: Secondary | ICD-10-CM

## 2012-05-12 NOTE — Progress Notes (Signed)
Subjective:     Patient ID: Kathy [name], female   DOB: [birth year], 79 y.o.   MRN: [id number]  HPI This patient follows up one year status post left breast lumpectomy for breast cancer. This was complicated with a postoperative seroma. She says thatprocedure room and has continued to gradually and slowly declined over the years and it continues to be asymptomatic. She says she is doing her self breast exam and has not noticed any changes. She recently had her mammogram in December which was BI-RADS 2 without any suspicious changes. There is no evidence of recurrent disease on the left. She continues to follow up with her medical oncologist and continues on her hormone tablets.  Review of Systems     Objective:   Physical Exam  No acute distress and nontoxic-appearing Her right breast is normal to exam without any suspicious masses, skin changes or lymphadenopathy Her left breast is abnormal shaped due to surgery and her postoperative seroma. This was soft and nontender. There is no sign of infection. There is no evidence of recurrent disease. The nodule that I appreciated on our last exam is not very noticeable today.    Assessment:     History of breast cancer She is doing well and has no complaints  about her breast. She says she is doing SBE and has not noticed any suspicious changes. She has more complaints about her deteriorating overall health been any breast complaints.     Plan:     Continue on her hormone treatments and monthly self breast exam and I will see her back in December to January after her mammogram. I will see her back sooner if she notices any suspicious changes.

## 2012-05-31 ENCOUNTER — Ambulatory Visit (HOSPITAL_BASED_OUTPATIENT_CLINIC_OR_DEPARTMENT_OTHER): Payer: Medicare Other | Admitting: Oncology

## 2012-05-31 ENCOUNTER — Telehealth: Payer: Self-pay | Admitting: Oncology

## 2012-05-31 VITALS — BP 120/79 | HR 95 | Temp 98.1°F | Resp 20 | Ht 60.0 in | Wt 182.2 lb

## 2012-05-31 DIAGNOSIS — C50519 Malignant neoplasm of lower-outer quadrant of unspecified female breast: Secondary | ICD-10-CM

## 2012-05-31 DIAGNOSIS — M949 Disorder of cartilage, unspecified: Secondary | ICD-10-CM

## 2012-05-31 DIAGNOSIS — Z170 Estrogen receptor positive status [ER+]: Secondary | ICD-10-CM

## 2012-05-31 DIAGNOSIS — C50919 Malignant neoplasm of unspecified site of unspecified female breast: Secondary | ICD-10-CM

## 2012-05-31 DIAGNOSIS — M899 Disorder of bone, unspecified: Secondary | ICD-10-CM | POA: Diagnosis not present

## 2012-05-31 MED ORDER — LETROZOLE 2.5 MG PO TABS: 2.5000 mg | ORAL_TABLET | Freq: Every day | ORAL | Status: DC

## 2012-05-31 NOTE — Telephone Encounter (Signed)
gv pt appt schedule for August.  °

## 2012-05-31 NOTE — Progress Notes (Signed)
ID: Kathy [name]   DOB: 1927-11-24  MR#: [id number]  WUJ#:[id number]  HISTORY OF PRESENT ILLNESS: The patient is an 79 year old [place] woman who had routine yearly screening mammography at [facility] 03/23/2011. A possible area of distortion was noted in the upper outer quadrant of the left breast and she was recalled for additional views November 29. There was a stellate mass in the outer lower quadrant of the left breast which appears more prominent. By ultrasound there was a suggestion of a 1 cm mass at the site of the scar.   BSGI was performed 03/31/2011 1 showed a focal intense isotopic area of activity measuring 1.6 cm at the site in question. Biopsy of this area was performed 04/07/2011 and showed [phone]) and invasive ductal carcinoma, grade 2, which was estrogen receptor positive at 100%, progesterone receptor positive at 69%, with an MIB-1-1 of 10%, and no HER-2 amplification.   The patient had her left lumpectomy, 05/06/2011. This showed multiple positive margins, so on 05/28/2011 she went back of to the operating table for margin clearance. This was successfully accomplished.   Her subsequent history is as detailed below.  INTERVAL HISTORY: Kathy [name] returns today for followup of her left breast carcinoma. The interval history is unremarkable. She has "slow down" a little bit in her house cleaning, but she still doesn't, drives, cookes and does all her normal activities.  REVIEW OF SYSTEMS: She is tolerating the letrozole well, except possibly for slight thinning of her hair. She has some hot flashes which she describes as mild and infrequent. She does have pain in her shoulders and hips, but as she clearly cc these problems were present before she started the medication. She has a little bit of a running nose, sinus problems, and stress urinary incontinence. Otherwise a detailed review of systems was noncontributory    PAST MEDICAL HISTORY: Past Medical History  Diagnosis Date  .  Hiatal hernia   . Diverticulitis   . PONV (postoperative nausea and vomiting)   . Hypertension     takes Amlodipine daily  . Hyperlipidemia     takes Zocor daily  . Shortness of breath     with exertion  . Arthritis     both shoulders  . Joint pain     both shoulders  . Hemorrhoids     internal  . Diverticulosis   . Urinary incontinence   . Nocturia   . Urinary frequency   . Sinus drainage   . Cataracts, bilateral   . Cancer     left breast  . Nasal congestion   . Breast cancer     PAST SURGICAL HISTORY: Past Surgical History  Procedure Date  . Left breast biopsy 1984  . Right breast biopsy 1993  . Bilateral knee replacements 2008  . Hemorrhoid surgery     outpatient "done in doctor's office"  . Colonoscopy   . Esophagogastroduodenoscopy   . Breast biopsy 05/04/2011    Procedure: BREAST BIOPSY WITH NEEDLE LOCALIZATION;  Surgeon: Rulon Abide, DO;  Location: [facility] OR;  Service: General;  Laterality: Left;  . Joint replacement 2008    bilateral knee replacement    FAMILY HISTORY Family History  Problem Relation Age of Onset  . Cancer Mother     pancreatic  . Cancer Father     esophageal  . Cancer Sister     ovarian  . Cancer Sister     lung  . Cancer Brother     kidney  . Cancer  Sister     breast cancer  . Anesthesia problems Neg Hx   . Hypotension Neg Hx   . Malignant hyperthermia Neg Hx   . Pseudochol deficiency Neg Hx   The patient's father died from stomach or esophageal cancer at the age of 25. The patient's mother died from pancreas cancer at the age of 9 the patient has 4 sisters and 4 brothers. One brother died from kidney cancer and the other 3 from heart disease. One sister died of ovarian cancer which was diagnosed in her 25s. One sister died from "female organ cancer" diagnosed in her 43s. One sister died from lung cancer. The other surviving sister, currently 98, was just diagnosed with breast cancer.    Gynecologic history: GX P0.  Menarche age 20, menopause in 76. She took hormone replacement approximately 8 years, stopping about 10 years ago. She had no complications from that treatment.   Social history: She used to work in a [employer], but is now retired. Her husband "S. L." Used to work in a Insurance claims handler. As noted, they have no children, and live by themselves. The patient drives, does all her cooking and a house chores, and is normally active for an 79 year old. She does not exercise regularly.  ADVANCED DIRECTIVES: No advanced directives in place   HEALTH MAINTENANCE: History  Substance Use Topics  . Smoking status: Former Smoker -- 0.2 packs/day for 10 years    Types: Cigarettes    Quit date: 05/21/1971  . Smokeless tobacco: Never Used     Comment: quit in the 60's  . Alcohol Use: No   Colonoscopy: 2006  PAP: 2000  Bone density: NOV 2012, lowest reading = T -1.2  Cholesterol: controlled w. Medication.     No Known Allergies  Current Outpatient Prescriptions  Medication Sig Dispense Refill  . alendronate (FOSAMAX) 70 MG tablet Take 70 mg by mouth every 7 (seven) days. Tuesday ; Take with a full glass of water on an empty stomach.      Marland Kitchen amLODipine (NORVASC) 5 MG tablet Take 5 mg by mouth daily.        Marland Kitchen aspirin 81 MG tablet Take 81 mg by mouth daily.        . Calcium Carbonate-Vitamin D (CALTRATE 600+D) 600-400 MG-UNIT per tablet Take 1 tablet by mouth 2 (two) times daily.        . cholecalciferol (VITAMIN D) 400 UNITS TABS Take 400 Units by mouth daily. Take in addition to Caltrate with Vitamin D to get 1200 units of Vitamin D      . Cyanocobalamin (VITAMIN B 12 PO) Take 1,000 mcg by mouth daily.      . fish oil-omega-3 fatty acids 1000 MG capsule Take 1 g by mouth daily.       Marland Kitchen letrozole (FEMARA) 2.5 MG tablet Take 1 tablet (2.5 mg total) by mouth daily.  30 tablet  11  . magnesium gluconate (MAGONATE) 500 MG tablet Take 500 mg by mouth 2 (two) times daily.      . simvastatin (ZOCOR) 20 MG  tablet Take 20 mg by mouth at bedtime.       . valsartan-hydrochlorothiazide (DIOVAN-HCT) 320-25 MG per tablet Take 1 tablet by mouth daily.       . vitamin C (ASCORBIC ACID) 500 MG tablet Take 500 mg by mouth daily.        OBJECTIVE: elderly white woman in no acute distress Filed Vitals:   05/31/12 0959  BP: 120/79  Pulse: 95  Temp: 98.1 F (36.7 C)  Resp: 20     Body mass index is 35.58 kg/(m^2).    ECOG FS: 1 Filed Weights   05/31/12 0959  Weight: 182 lb 3.2 oz (82.645 kg)   Sclera not icteric Oropharynx clear No cervical or supraclavicular adenopathy Lungs no crackles or wheezes  Heart regular rate and rhythm Abdomen soft, nontender, positive bowel sounds Musculoskeletal exam shows kyphosis but no focal spinal tenderness. There is no upper extremity edema. She has restricted range of motion of both upper extremities Neurologic exam is nonfocal, the patient is well oriented, with positive affect The right breast is unremarkable. The left breast is status post lumpectomy. There is significant distortion of the breast contour as previously noted, and she does have a fluid collection which is not erythematous or tender. That also has been previously noted.    LAB RESULTS: Lab Results  Component Value Date   WBC 7.4 05/06/2012   NEUTROABS 4.9 05/06/2012   HGB 14.0 05/06/2012   HCT 41.7 05/06/2012   MCV 93.8 05/06/2012   PLT 199 05/06/2012      Chemistry      Component Value Date/Time   NA 140 05/06/2012 0959   NA 138 05/26/2011 1016   K 4.3 05/06/2012 0959   K 4.8 05/26/2011 1016   CL 102 05/06/2012 0959   CL 100 05/26/2011 1016   CO2 28 05/06/2012 0959   CO2 29 05/26/2011 1016   BUN 21.0 05/06/2012 0959   BUN 20 05/26/2011 1016   CREATININE 1.0 05/06/2012 0959   CREATININE 0.75 05/26/2011 1016      Component Value Date/Time   CALCIUM 9.8 05/06/2012 0959   CALCIUM 9.2 05/26/2011 1016   ALKPHOS 58 05/06/2012 0959   ALKPHOS 52 04/15/2011 0829   AST 11 05/06/2012 0959   AST 13  04/15/2011 0829   ALT 7 05/06/2012 0959   ALT 10 04/15/2011 0829   BILITOT 0.90 05/06/2012 0959   BILITOT 0.3 04/15/2011 0829       STUDIES: Most recent bone density at [facility] in November 2012 showed osteopenia. Repeat mammography due December 2013.  ASSESSMENT: 79 year old [place] woman   (1)  status post left lumpectomy with subsequent margin clearance January of 2013 for a T1c NX, (stage I) invasive ductal carcinoma, grade 2, strongly estrogen and progesterone receptor positive, with a low MIB-1, and no HER-2 amplification.   (2) on letrozole since February 2013.    PLAN: Kathy [name] continues to tolerate the letrozole well and the plan is to continue this for 5 years. She will see [place] again in July, and since she sees Dr. Nicanor Alcon every January, we will start seeing her on a yearly basis every July from the next visit. She will have a repeat bone density sometime before the July 2015 visit. Kathy [name] knows to call for any problems that may develop before her return here.   Elam Ellis C    05/31/2012

## 2012-06-16 DIAGNOSIS — I4891 Unspecified atrial fibrillation: Secondary | ICD-10-CM | POA: Diagnosis not present

## 2012-06-20 DIAGNOSIS — I4891 Unspecified atrial fibrillation: Secondary | ICD-10-CM | POA: Diagnosis not present

## 2012-06-28 DIAGNOSIS — I4891 Unspecified atrial fibrillation: Secondary | ICD-10-CM | POA: Diagnosis not present

## 2012-07-07 ENCOUNTER — Encounter: Payer: Self-pay | Admitting: *Deleted

## 2012-07-07 DIAGNOSIS — E785 Hyperlipidemia, unspecified: Secondary | ICD-10-CM

## 2012-07-07 DIAGNOSIS — I10 Essential (primary) hypertension: Secondary | ICD-10-CM | POA: Insufficient documentation

## 2012-07-12 ENCOUNTER — Encounter: Payer: Self-pay | Admitting: Family Medicine

## 2012-07-12 ENCOUNTER — Other Ambulatory Visit: Payer: Self-pay | Admitting: Family Medicine

## 2012-07-12 ENCOUNTER — Ambulatory Visit (INDEPENDENT_AMBULATORY_CARE_PROVIDER_SITE_OTHER): Payer: Medicare Other | Admitting: Family Medicine

## 2012-07-12 ENCOUNTER — Ambulatory Visit: Payer: Self-pay | Admitting: Family Medicine

## 2012-07-12 VITALS — BP 108/60 | HR 80 | Temp 98.0°F | Resp 20 | Ht 60.0 in | Wt 185.0 lb

## 2012-07-12 DIAGNOSIS — I4891 Unspecified atrial fibrillation: Secondary | ICD-10-CM

## 2012-07-12 LAB — PROTIME-INR
INR, fingerstick: 3.8 — ABNORMAL HIGH (ref 0.80–1.20)
PT, fingerstick: 45 seconds — ABNORMAL HIGH (ref 10.4–12.5)

## 2012-07-12 MED ORDER — METOPROLOL TARTRATE 25 MG PO TABS: 25.0000 mg | ORAL_TABLET | Freq: Two times a day (BID) | ORAL | Status: DC

## 2012-07-12 NOTE — Patient Instructions (Addendum)
I want you to skip coumadin tomorrow.   Then, 1 1/2 tabs (7.5 mg) on Mon, Wed, Frid And 1 tab (5mg ) on Tues, Thurs, Sat, Sun.

## 2012-07-12 NOTE — Progress Notes (Signed)
Subjective:     Patient ID: KAMERIA CANIZARES, female   DOB: Jun 19, 1927, 79 y.o.   MRN: [id number]  HPI Patient is currently taking warfarin 5 mg poqd on Mon and Fri and she is using 7.5 mg on tues, thurs, sat, and sun.  INR is 3.8, PT 45.0. She denies any recent change in diet. She denies any epistaxis, bruising, GI bleeding. She denies any recent medicine change.  Heart rate is controlled on Lopressor 25 mg by mouth twice a day. She is scheduled to see cardiology at the end of March.  Review of Systems Review of systems is otherwise negative    Objective:   Physical Exam  Cardiovascular: Normal rate and normal pulses.  An irregularly irregular rhythm present.  Pulmonary/Chest: Effort normal. No accessory muscle usage.       Assessment:    atrial fibrillation    Plan:     Skip Coumadin tomorrow.  Then began Coumadin 7.5 mg Monday Wednesday and Friday, and 5 mg on Tuesday Thursday Saturday and Sunday.   recheck an INR in one month.

## 2012-07-18 ENCOUNTER — Encounter: Payer: Self-pay | Admitting: Family Medicine

## 2012-07-18 ENCOUNTER — Ambulatory Visit
Admission: RE | Admit: 2012-07-18 | Discharge: 2012-07-18 | Disposition: A | Discharge status: Home / Self Care | Payer: Medicare Other | Source: Ambulatory Visit | Attending: Family Medicine | Admitting: Family Medicine

## 2012-07-18 ENCOUNTER — Other Ambulatory Visit: Payer: Self-pay | Admitting: Family Medicine

## 2012-07-18 DIAGNOSIS — M549 Dorsalgia, unspecified: Secondary | ICD-10-CM

## 2012-07-18 DIAGNOSIS — M47817 Spondylosis without myelopathy or radiculopathy, lumbosacral region: Secondary | ICD-10-CM | POA: Diagnosis not present

## 2012-07-18 NOTE — Telephone Encounter (Signed)
This encounter was created in error - please disregard.

## 2012-07-25 DIAGNOSIS — I10 Essential (primary) hypertension: Secondary | ICD-10-CM | POA: Diagnosis not present

## 2012-07-25 DIAGNOSIS — I4891 Unspecified atrial fibrillation: Secondary | ICD-10-CM | POA: Diagnosis not present

## 2012-07-25 DIAGNOSIS — E785 Hyperlipidemia, unspecified: Secondary | ICD-10-CM | POA: Diagnosis not present

## 2012-08-12 ENCOUNTER — Ambulatory Visit: Payer: Medicare Other | Admitting: Family Medicine

## 2012-08-15 ENCOUNTER — Ambulatory Visit (INDEPENDENT_AMBULATORY_CARE_PROVIDER_SITE_OTHER): Payer: Medicare Other | Admitting: Family Medicine

## 2012-08-15 ENCOUNTER — Encounter: Payer: Self-pay | Admitting: Family Medicine

## 2012-08-15 VITALS — BP 110/68 | HR 72 | Temp 98.1°F | Resp 18 | Wt 185.0 lb

## 2012-08-15 DIAGNOSIS — I4891 Unspecified atrial fibrillation: Secondary | ICD-10-CM

## 2012-08-15 DIAGNOSIS — M5136 Other intervertebral disc degeneration, lumbar region: Secondary | ICD-10-CM

## 2012-08-15 DIAGNOSIS — M5137 Other intervertebral disc degeneration, lumbosacral region: Secondary | ICD-10-CM | POA: Diagnosis not present

## 2012-08-15 LAB — PT WITH INR/FINGERSTICK
INR, fingerstick: 3.1 — ABNORMAL HIGH (ref 0.80–1.20)
PT, fingerstick: 37.7 seconds — ABNORMAL HIGH (ref 10.4–12.5)

## 2012-08-15 NOTE — Progress Notes (Signed)
Subjective:    Patient ID: Kathy [name], female    DOB: Jun 06, 1927, 79 y.o.   MRN: [id number]  HPI Patient is here for followup of her atrial fibrillation as well as her low back pain. With regards to her  atrial fibrillation, she is currently taking Lopressor 25 mg by mouth twice a day.  Her rate is well controlled in the 70s. She denies any palpitations or presyncope. She is currently on Coumadin 7.5 mg Monday, Wednesday, Fridays. She takes 5 mg on Tuesday, Thursday, Saturday, Sunday.  Her INR today in the clinic is 3.1.  She denies any easy bruising or bleeding.  We recently obtained an x-ray of her L. spine. This showed severe multilevel spondylolysis.  She has advanced degenerative disc disease.  Currently she is not taking anything for her back pain. She states if she sits for a few minutes her pain will improve. The pain is worse with prolonged standing. She denies any symptoms of cauda equina syndrome or sciatica. Past Medical History  Diagnosis Date  . Hiatal hernia   . Diverticulitis   . PONV (postoperative nausea and vomiting)   . Shortness of breath     with exertion  . Arthritis     both shoulders  . Joint pain     both shoulders  . Hemorrhoids     internal  . Diverticulosis   . Urinary incontinence   . Nocturia   . Urinary frequency   . Sinus drainage   . Cataracts, bilateral   . Cancer     left breast  . Nasal congestion   . Breast cancer   . Hyperlipidemia     takes Zocor daily  . Hypertension     takes Amlodipine daily  . Obesity   . Macular degeneration 02/2003  . Osteoporosis   . Atrial fibrillation    Current Outpatient Prescriptions on File Prior to Visit  Medication Sig Dispense Refill  . alendronate (FOSAMAX) 70 MG tablet Take 70 mg by mouth every 7 (seven) days. Tuesday ; Take with a full glass of water on an empty stomach.      Marland Kitchen amLODipine (NORVASC) 5 MG tablet Take 5 mg by mouth daily.        Marland Kitchen aspirin 81 MG tablet Take 81 mg by mouth daily.         . Calcium Carbonate-Vitamin D (CALTRATE 600+D) 600-400 MG-UNIT per tablet Take 1 tablet by mouth 2 (two) times daily.        . cholecalciferol (VITAMIN D) 400 UNITS TABS Take 400 Units by mouth daily. Take in addition to Caltrate with Vitamin D to get 1200 units of Vitamin D      . fish oil-omega-3 fatty acids 1000 MG capsule Take 1 g by mouth daily.       Marland Kitchen letrozole (FEMARA) 2.5 MG tablet Take 1 tablet (2.5 mg total) by mouth daily.  90 tablet  11  . magnesium gluconate (MAGONATE) 500 MG tablet Take 500 mg by mouth 2 (two) times daily.      . metoprolol tartrate (LOPRESSOR) 25 MG tablet Take 1 tablet (25 mg total) by mouth 2 (two) times daily.  60 tablet  11  . simvastatin (ZOCOR) 20 MG tablet Take 20 mg by mouth at bedtime.       . valsartan-hydrochlorothiazide (DIOVAN-HCT) 320-25 MG per tablet Take 1 tablet by mouth daily.       . vitamin C (ASCORBIC ACID) 500 MG tablet Take 500 mg  by mouth daily.      Marland Kitchen warfarin (COUMADIN) 5 MG tablet Take by mouth daily. 7.5 mg m/w/f 5mg  other days       No current facility-administered medications on file prior to visit.   No Known Allergies History   Social History  . Marital Status: Married    Spouse Name: N/A    Number of Children: N/A  . Years of Education: N/A   Occupational History  . retired    Social History Main Topics  . Smoking status: Former Smoker -- 0.25 packs/day for 10 years    Types: Cigarettes    Quit date: 05/21/1971  . Smokeless tobacco: Never Used     Comment: quit in the 60's  . Alcohol Use: No  . Drug Use: No  . Sexually Active: Not Currently   Other Topics Concern  . Not on file   Social History Narrative  . No narrative on file      Review of Systems    of systems is otherwise Objective:   Physical Exam  Cardiovascular: S1 normal and S2 normal.  An irregularly irregular rhythm present. Exam reveals no gallop.   No murmur heard. Pulmonary/Chest: Effort normal and breath sounds normal.   Musculoskeletal:       Lumbar back: She exhibits decreased range of motion. She exhibits no tenderness, no bony tenderness, no swelling and no spasm.          Assessment & Plan:  1. A-fib Change Coumadin to 7.5 mg on Monday and Friday, 5 mg on every other day a week. We will recheck her INR in 1 month. - PT with INR/Fingerstick  2. DDD (degenerative disc disease), lumbar Patient elects to try conservative therapy of Tylenol 500 mg by mouth twice a day and rest. She will contact me if pain worsens.

## 2012-08-15 NOTE — Patient Instructions (Addendum)
7.5 mg (1 1/2 pills) on Monday and Friday 5mg  ( 1pill) every other day of the week. Recheck  In 1 month

## 2012-08-18 DIAGNOSIS — I10 Essential (primary) hypertension: Secondary | ICD-10-CM | POA: Diagnosis not present

## 2012-08-18 DIAGNOSIS — I4891 Unspecified atrial fibrillation: Secondary | ICD-10-CM | POA: Diagnosis not present

## 2012-08-19 DIAGNOSIS — I4891 Unspecified atrial fibrillation: Secondary | ICD-10-CM | POA: Diagnosis not present

## 2012-08-19 DIAGNOSIS — I10 Essential (primary) hypertension: Secondary | ICD-10-CM | POA: Diagnosis not present

## 2012-08-29 DIAGNOSIS — I10 Essential (primary) hypertension: Secondary | ICD-10-CM | POA: Diagnosis not present

## 2012-08-29 DIAGNOSIS — I4891 Unspecified atrial fibrillation: Secondary | ICD-10-CM | POA: Diagnosis not present

## 2012-09-03 ENCOUNTER — Encounter: Payer: Self-pay | Admitting: Family Medicine

## 2012-09-07 ENCOUNTER — Ambulatory Visit (INDEPENDENT_AMBULATORY_CARE_PROVIDER_SITE_OTHER): Payer: Medicare Other | Admitting: Family Medicine

## 2012-09-07 ENCOUNTER — Encounter: Payer: Self-pay | Admitting: Family Medicine

## 2012-09-07 VITALS — BP 110/70 | HR 62 | Temp 98.0°F | Resp 18 | Ht 60.0 in | Wt 187.0 lb

## 2012-09-07 DIAGNOSIS — I4891 Unspecified atrial fibrillation: Secondary | ICD-10-CM

## 2012-09-07 LAB — PT WITH INR/FINGERSTICK: INR, fingerstick: 4 — ABNORMAL HIGH (ref 0.80–1.20)

## 2012-09-07 NOTE — Patient Instructions (Addendum)
INR is 4.0 No coumadin tomorrow.  Then change coumadin to 5 mg everyday and recheck INR in 1 week.

## 2012-09-07 NOTE — Progress Notes (Signed)
Subjective:    Patient ID: Kathy [name], female    DOB: 24-May-1927, 79 y.o.   MRN: [id number]  HPI  Patient is here for followup of her atrial fibrillation. With regards to her  atrial fibrillation, she is currently taking Lopressor 50 mg by mouth twice a day and digoxin 0.125 mg poqday.  Her rate is well controlled in the 60s. She denies any palpitations or presyncope. She is currently on Coumadin 7.5 mg Monday, Fridays. She takes 5 mg on Tuesday, Wednesday,Thursday, Saturday, Sunday.  inr is 4.0.  She denies any easy bruising or bleeding. She is scheduled for cardioversion in 1 week.  Past Medical History  Diagnosis Date  . Hiatal hernia   . Diverticulitis   . PONV (postoperative nausea and vomiting)   . Shortness of breath     with exertion  . Arthritis     both shoulders  . Joint pain     both shoulders  . Hemorrhoids     internal  . Diverticulosis   . Urinary incontinence   . Nocturia   . Urinary frequency   . Sinus drainage   . Cataracts, bilateral   . Cancer     left breast  . Nasal congestion   . Breast cancer   . Hyperlipidemia     takes Zocor daily  . Hypertension     takes Amlodipine daily  . Obesity   . Macular degeneration 02/2003  . Osteoporosis   . Atrial fibrillation    Current Outpatient Prescriptions on File Prior to Visit  Medication Sig Dispense Refill  . alendronate (FOSAMAX) 70 MG tablet Take 70 mg by mouth every 7 (seven) days. Tuesday ; Take with a full glass of water on an empty stomach.      Marland Kitchen amLODipine (NORVASC) 5 MG tablet Take 5 mg by mouth daily.        Marland Kitchen aspirin 81 MG tablet Take 81 mg by mouth daily.        . Calcium Carbonate-Vitamin D (CALTRATE 600+D) 600-400 MG-UNIT per tablet Take 1 tablet by mouth 2 (two) times daily.        . cholecalciferol (VITAMIN D) 400 UNITS TABS Take 400 Units by mouth daily. Take in addition to Caltrate with Vitamin D to get 1200 units of Vitamin D      . digoxin (LANOXIN) 0.125 MG tablet Take 0.125 mg  by mouth daily.      . fish oil-omega-3 fatty acids 1000 MG capsule Take 1 g by mouth daily.       Marland Kitchen letrozole (FEMARA) 2.5 MG tablet Take 1 tablet (2.5 mg total) by mouth daily.  90 tablet  11  . magnesium gluconate (MAGONATE) 500 MG tablet Take 500 mg by mouth 2 (two) times daily.      . metoprolol tartrate (LOPRESSOR) 25 MG tablet Take 1 tablet (25 mg total) by mouth 2 (two) times daily.  60 tablet  11  . simvastatin (ZOCOR) 20 MG tablet Take 20 mg by mouth at bedtime.       . valsartan-hydrochlorothiazide (DIOVAN-HCT) 320-25 MG per tablet Take 1 tablet by mouth daily.       . vitamin C (ASCORBIC ACID) 500 MG tablet Take 500 mg by mouth daily.      Marland Kitchen warfarin (COUMADIN) 5 MG tablet Take by mouth daily. 7.5 mg m/w/f 5mg  other days       No current facility-administered medications on file prior to visit.   No Known Allergies  History   Social History  . Marital Status: Married    Spouse Name: N/A    Number of Children: N/A  . Years of Education: N/A   Occupational History  . retired    Social History Main Topics  . Smoking status: Former Smoker -- 0.25 packs/day for 10 years    Types: Cigarettes    Quit date: 05/21/1971  . Smokeless tobacco: Never Used     Comment: quit in the 60's  . Alcohol Use: No  . Drug Use: No  . Sexually Active: Not Currently   Other Topics Concern  . Not on file   Social History Narrative  . No narrative on file      Review of Systems     of systems is otherwise Objective:   Physical Exam  Cardiovascular: S1 normal and S2 normal.  An irregularly irregular rhythm present. Exam reveals no gallop.   No murmur heard. Pulmonary/Chest: Effort normal and breath sounds normal.  Musculoskeletal:       Lumbar back: She exhibits decreased range of motion. She exhibits no tenderness, no bony tenderness, no swelling and no spasm.          Assessment & Plan:  1. A-fib No coumadin tomorrow.  Then decrease coumadin to 5 mg poqday. We will  recheck her INR in 1 week. - PT with INR/Fingerstick

## 2012-09-14 ENCOUNTER — Ambulatory Visit (INDEPENDENT_AMBULATORY_CARE_PROVIDER_SITE_OTHER): Payer: Medicare Other | Admitting: Family Medicine

## 2012-09-14 ENCOUNTER — Encounter: Payer: Self-pay | Admitting: Family Medicine

## 2012-09-14 VITALS — BP 100/80 | HR 72 | Temp 98.3°F | Resp 18

## 2012-09-14 DIAGNOSIS — I4891 Unspecified atrial fibrillation: Secondary | ICD-10-CM | POA: Diagnosis not present

## 2012-09-14 LAB — PT WITH INR/FINGERSTICK
INR, fingerstick: 3.5 — ABNORMAL HIGH (ref 0.80–1.20)
PT, fingerstick: 42.5 seconds — ABNORMAL HIGH (ref 10.4–12.5)

## 2012-09-14 NOTE — Progress Notes (Signed)
Subjective:    Patient ID: Kathy [name], female    DOB: 11/04/1927, 79 y.o.   MRN: [id number]  HPI 09/07/12 Patient is here for followup of her atrial fibrillation. With regards to her  atrial fibrillation, she is currently taking Lopressor 50 mg by mouth twice a day and digoxin 0.125 mg poqday.  Her rate is well controlled in the 60s. She denies any palpitations or presyncope. She is currently on Coumadin 7.5 mg Monday, Fridays. She takes 5 mg on Tuesday, Wednesday,Thursday, Saturday, Sunday.  inr is 4.0.  She denies any easy bruising or bleeding. She is scheduled for cardioversion in 1 week.  Therefore, my plan last time was: No coumadin tomorrow.  Then decrease coumadin to 5 mg poqday. We will recheck her INR in 1 week.  09/14/12 INR remains elevated at 3.5. She is asymptomatic. She denies any change in her diet or medication.  Past Medical History  Diagnosis Date  . Hiatal hernia   . Diverticulitis   . PONV (postoperative nausea and vomiting)   . Shortness of breath     with exertion  . Arthritis     both shoulders  . Joint pain     both shoulders  . Hemorrhoids     internal  . Diverticulosis   . Urinary incontinence   . Nocturia   . Urinary frequency   . Sinus drainage   . Cataracts, bilateral   . Cancer     left breast  . Nasal congestion   . Breast cancer   . Hyperlipidemia     takes Zocor daily  . Hypertension     takes Amlodipine daily  . Obesity   . Macular degeneration 02/2003  . Osteoporosis   . Atrial fibrillation    Current Outpatient Prescriptions on File Prior to Visit  Medication Sig Dispense Refill  . alendronate (FOSAMAX) 70 MG tablet Take 70 mg by mouth every 7 (seven) days. Tuesday ; Take with a full glass of water on an empty stomach.      Marland Kitchen amLODipine (NORVASC) 5 MG tablet Take 5 mg by mouth daily.        Marland Kitchen aspirin 81 MG tablet Take 81 mg by mouth daily.        . Calcium Carbonate-Vitamin D (CALTRATE 600+D) 600-400 MG-UNIT per tablet Take 1  tablet by mouth 2 (two) times daily.        . cholecalciferol (VITAMIN D) 400 UNITS TABS Take 400 Units by mouth daily. Take in addition to Caltrate with Vitamin D to get 1200 units of Vitamin D      . digoxin (LANOXIN) 0.125 MG tablet Take 0.125 mg by mouth daily.      . fish oil-omega-3 fatty acids 1000 MG capsule Take 1 g by mouth daily.       Marland Kitchen letrozole (FEMARA) 2.5 MG tablet Take 1 tablet (2.5 mg total) by mouth daily.  90 tablet  11  . magnesium gluconate (MAGONATE) 500 MG tablet Take 500 mg by mouth 2 (two) times daily.      . metoprolol tartrate (LOPRESSOR) 25 MG tablet Take 1 tablet (25 mg total) by mouth 2 (two) times daily.  60 tablet  11  . simvastatin (ZOCOR) 20 MG tablet Take 20 mg by mouth at bedtime.       . valsartan-hydrochlorothiazide (DIOVAN-HCT) 320-25 MG per tablet Take 1 tablet by mouth daily.       . vitamin C (ASCORBIC ACID) 500 MG tablet Take 500  mg by mouth daily.      Marland Kitchen warfarin (COUMADIN) 5 MG tablet Take by mouth daily. 7.5 mg m/w/f 5mg  other days       No current facility-administered medications on file prior to visit.   No Known Allergies History   Social History  . Marital Status: Married    Spouse Name: N/A    Number of Children: N/A  . Years of Education: N/A   Occupational History  . retired    Social History Main Topics  . Smoking status: Former Smoker -- 0.25 packs/day for 10 years    Types: Cigarettes    Quit date: 05/21/1971  . Smokeless tobacco: Never Used     Comment: quit in the 60's  . Alcohol Use: No  . Drug Use: No  . Sexually Active: Not Currently   Other Topics Concern  . Not on file   Social History Narrative  . No narrative on file      Review of Systems     of systems is otherwise Objective:   Physical Exam  Cardiovascular: S1 normal and S2 normal.  An irregularly irregular rhythm present. Exam reveals no gallop.   No murmur heard. Pulmonary/Chest: Effort normal and breath sounds normal.           Assessment & Plan:  1. A-fib Change Coumadin to 5 mg every day except Mondays and Thursdays. On Mondays and Thursdays she is to take 2-1/2 mg.  Recheck INR in 2 weeks. - PT with INR/Fingerstick

## 2012-09-15 ENCOUNTER — Ambulatory Visit: Payer: Self-pay | Admitting: Family Medicine

## 2012-09-15 ENCOUNTER — Encounter (HOSPITAL_COMMUNITY): Payer: Self-pay | Admitting: *Deleted

## 2012-09-15 ENCOUNTER — Ambulatory Visit (HOSPITAL_COMMUNITY): Payer: Medicare Other | Admitting: Anesthesiology

## 2012-09-15 ENCOUNTER — Encounter (HOSPITAL_COMMUNITY)
Admission: RE | Disposition: A | Discharge status: Home / Self Care | Payer: Self-pay | Source: Ambulatory Visit | Attending: Cardiology

## 2012-09-15 ENCOUNTER — Ambulatory Visit (HOSPITAL_COMMUNITY)
Admission: RE | Admit: 2012-09-15 | Discharge: 2012-09-15 | Disposition: A | Discharge status: Home / Self Care | Payer: Medicare Other | Source: Ambulatory Visit | Attending: Cardiology | Admitting: Cardiology

## 2012-09-15 ENCOUNTER — Encounter (HOSPITAL_COMMUNITY): Payer: Self-pay | Admitting: Anesthesiology

## 2012-09-15 DIAGNOSIS — Z79899 Other long term (current) drug therapy: Secondary | ICD-10-CM | POA: Diagnosis not present

## 2012-09-15 DIAGNOSIS — M810 Age-related osteoporosis without current pathological fracture: Secondary | ICD-10-CM | POA: Diagnosis not present

## 2012-09-15 DIAGNOSIS — I4891 Unspecified atrial fibrillation: Secondary | ICD-10-CM | POA: Diagnosis not present

## 2012-09-15 DIAGNOSIS — E785 Hyperlipidemia, unspecified: Secondary | ICD-10-CM | POA: Insufficient documentation

## 2012-09-15 DIAGNOSIS — I10 Essential (primary) hypertension: Secondary | ICD-10-CM | POA: Insufficient documentation

## 2012-09-15 DIAGNOSIS — Z7901 Long term (current) use of anticoagulants: Secondary | ICD-10-CM | POA: Insufficient documentation

## 2012-09-15 DIAGNOSIS — Z87891 Personal history of nicotine dependence: Secondary | ICD-10-CM | POA: Diagnosis not present

## 2012-09-15 DIAGNOSIS — E669 Obesity, unspecified: Secondary | ICD-10-CM | POA: Insufficient documentation

## 2012-09-15 DIAGNOSIS — Z853 Personal history of malignant neoplasm of breast: Secondary | ICD-10-CM | POA: Insufficient documentation

## 2012-09-15 HISTORY — PX: CARDIOVERSION: SHX1299

## 2012-09-15 LAB — BASIC METABOLIC PANEL
CO2: 22 mEq/L (ref 19–32)
Calcium: 9.5 mg/dL (ref 8.4–10.5)
Creatinine, Ser: 0.82 mg/dL (ref 0.50–1.10)
GFR calc non Af Amer: 65 mL/min — ABNORMAL LOW (ref >90)
Sodium: 136 mEq/L (ref 135–145)

## 2012-09-15 LAB — POCT I-STAT 4, (NA,K, GLUC, HGB,HCT)
Hemoglobin: 13.6 g/dL (ref 12.0–15.0)
Potassium: 4 mEq/L (ref 3.5–5.1)
Sodium: 144 mEq/L (ref 135–145)

## 2012-09-15 SURGERY — CARDIOVERSION
Anesthesia: Monitor Anesthesia Care

## 2012-09-15 MED ORDER — SODIUM CHLORIDE 0.9 % IV SOLN
INTRAVENOUS | Status: DC | PRN
  Administered 2012-09-15: 12:00:00 via INTRAVENOUS
  Administered 2012-09-15: 500 mL

## 2012-09-15 MED ORDER — PROPOFOL 10 MG/ML IV BOLUS
INTRAVENOUS | Status: DC | PRN
  Administered 2012-09-15 (×2): 40 mg via INTRAVENOUS

## 2012-09-15 MED ORDER — LIDOCAINE HCL (CARDIAC) 20 MG/ML IV SOLN
INTRAVENOUS | Status: DC | PRN
  Administered 2012-09-15: 20 mg via INTRAVENOUS

## 2012-09-15 NOTE — Transfer of Care (Signed)
Immediate Anesthesia Transfer of Care Note  Patient: Kathy [name]  Procedure(s) Performed: Procedure(s) with comments: CARDIOVERSION (N/A) - h&p in file-HW  Patient Location: PACU  Anesthesia Type:General  Level of Consciousness: awake, alert , oriented and patient cooperative  Airway & Oxygen Therapy: Patient Spontanous Breathing and Patient connected to nasal cannula oxygen  Post-op Assessment: Report given to PACU RN, Post -op Vital signs reviewed and stable and Patient moving all extremities  Post vital signs: Reviewed and stable  Complications: No apparent anesthesia complications

## 2012-09-15 NOTE — Anesthesia Preprocedure Evaluation (Signed)
Anesthesia Evaluation  Patient identified by MRN, date of birth, ID band Patient awake    Reviewed: Allergy & Precautions, H&P , NPO status , Patient's Chart, lab work & pertinent test results  History of Anesthesia Complications (+) PONV and AWARENESS UNDER ANESTHESIA  Airway Mallampati: II TM Distance: >3 FB Neck ROM: Full    Dental   Pulmonary shortness of breath,  breath sounds clear to auscultation  Pulmonary exam normal       Cardiovascular hypertension, + dysrhythmias Atrial Fibrillation Rhythm:Irregular Rate:Normal     Neuro/Psych  Neuromuscular disease    GI/Hepatic hiatal hernia,   Endo/Other    Renal/GU      Musculoskeletal   Abdominal (+) + obese,   Peds  Hematology   Anesthesia Other Findings   Reproductive/Obstetrics                           Anesthesia Physical Anesthesia Plan  ASA: III  Anesthesia Plan: General   Post-op Pain Management:    Induction: Intravenous  Airway Management Planned: Mask  Additional Equipment:   Intra-op Plan:   Post-operative Plan:   Informed Consent: I have reviewed the patients History and Physical, chart, labs and discussed the procedure including the risks, benefits and alternatives for the proposed anesthesia with the patient or authorized representative who has indicated his/her understanding and acceptance.     Plan Discussed with: CRNA and Surgeon  Anesthesia Plan Comments:         Anesthesia Quick Evaluation

## 2012-09-15 NOTE — CV Procedure (Addendum)
Direct current cardioversion:  Indication  A. Fibrillation. Result: Failed Cardioversion x 3. Transient NSR.  Procedure: Using 40 mg of IV Propofol for achieving deep (Moderate sedation), synchronized direct current cardioversion performed. Patient was delivered with 120 Joules of electricity X 1 with success to NSR. Patient tolerated the procedure well. No immediate complication noted.   Patient went into A. Fibrillation after I had completed the orders and hence 2nd attempt at cardioversion was done successful cardioversion with 120Jx1.  But then patient went back into A. Fibrillation and 3rd cardioversion did not convert to sinus and patient remained in A. Fibrillation. Procedure abandoned. Additional 30 mg of Propofol was given to the patient

## 2012-09-15 NOTE — H&P (Signed)
  Please see office visit notes for complete details of HPI.  

## 2012-09-15 NOTE — Preoperative (Signed)
Beta Blockers   Reason not to administer Beta Blockers:Not Applicable, took metoprolol today. 

## 2012-09-15 NOTE — Anesthesia Procedure Notes (Signed)
Date/Time: 09/15/2012 12:36 PM Performed by: Angelica Pou Pre-anesthesia Checklist: Patient identified, Emergency Drugs available, Suction available, Patient being monitored and Timeout performed Patient Re-evaluated:Patient Re-evaluated prior to inductionOxygen Delivery Method: Ambu bag Preoxygenation: Pre-oxygenation with 100% oxygen Intubation Type: IV induction Ventilation: Mask ventilation without difficulty Dental Injury: Teeth and Oropharynx as per pre-operative assessment

## 2012-09-15 NOTE — Interval H&P Note (Signed)
History and Physical Interval Note:  09/15/2012 12:33 PM  Kathy [name]  has presented today for surgery, with the diagnosis of a-fib  The various methods of treatment have been discussed with the patient and family. After consideration of risks, benefits and other options for treatment, the patient has consented to  Procedure(s) with comments: CARDIOVERSION (N/A) - h&p in file-HW as a surgical intervention .  The patient's history has been reviewed, patient examined, no change in status, stable for surgery.  I have reviewed the patient's chart and labs.  Questions were answered to the patient's satisfaction.     Pamella Pert

## 2012-09-15 NOTE — Anesthesia Postprocedure Evaluation (Signed)
  Anesthesia Post-op Note  Patient: Kathy [name]  Procedure(s) Performed: Procedure(s) with comments: CARDIOVERSION (N/A) - h&p in file-HW  Patient Location: Endoscopy Unit  Anesthesia Type:General  Level of Consciousness: awake  Airway and Oxygen Therapy: Patient Spontanous Breathing  Post-op Pain: none  Post-op Assessment: Post-op Vital signs reviewed, Patient's Cardiovascular Status Stable, Respiratory Function Stable, Patent Airway, No signs of Nausea or vomiting and Pain level controlled  Post-op Vital Signs: stable  Complications: No apparent anesthesia complications

## 2012-09-16 ENCOUNTER — Encounter (HOSPITAL_COMMUNITY): Payer: Self-pay | Admitting: Cardiology

## 2012-09-23 DIAGNOSIS — I10 Essential (primary) hypertension: Secondary | ICD-10-CM | POA: Diagnosis not present

## 2012-09-23 DIAGNOSIS — I4891 Unspecified atrial fibrillation: Secondary | ICD-10-CM | POA: Diagnosis not present

## 2012-09-23 DIAGNOSIS — Z9889 Other specified postprocedural states: Secondary | ICD-10-CM | POA: Diagnosis not present

## 2012-09-29 ENCOUNTER — Ambulatory Visit (INDEPENDENT_AMBULATORY_CARE_PROVIDER_SITE_OTHER): Payer: Medicare Other | Admitting: Family Medicine

## 2012-09-29 ENCOUNTER — Encounter: Payer: Self-pay | Admitting: Family Medicine

## 2012-09-29 VITALS — BP 120/78 | HR 68 | Temp 98.0°F | Resp 16

## 2012-09-29 DIAGNOSIS — I4891 Unspecified atrial fibrillation: Secondary | ICD-10-CM

## 2012-09-29 LAB — PT WITH INR/FINGERSTICK: PT, fingerstick: 23.8 seconds — ABNORMAL HIGH (ref 10.4–12.5)

## 2012-09-29 NOTE — Progress Notes (Signed)
Subjective:    Patient ID: Kathy [name], female    DOB: Jul 10, 1927, 79 y.o.   MRN: [id number]  HPI 09/07/12 Patient is here for followup of her atrial fibrillation. With regards to her  atrial fibrillation, she is currently taking Lopressor 50 mg by mouth twice a day and digoxin 0.125 mg poqday.  Her rate is well controlled in the 60s. She denies any palpitations or presyncope. She is currently on Coumadin 7.5 mg Monday, Fridays. She takes 5 mg on Tuesday, Wednesday,Thursday, Saturday, Sunday.  inr is 4.0.  She denies any easy bruising or bleeding. She is scheduled for cardioversion in 1 week.  Therefore, my plan last time was: No coumadin tomorrow.  Then decrease coumadin to 5 mg poqday. We will recheck her INR in 1 week.  09/14/12 INR remains elevated at 3.5. She is asymptomatic. She denies any change in her diet or medication.  Therefore, change Coumadin to 5 mg every day except Mondays and Thursdays. On Mondays and Thursdays she is to take 2-1/2 mg.  Recheck INR in 2 weeks.  09/29/12 She is doing great. She failed cardioversion. Her INR today in clinic is 2.0. She denies any bleeding or bruising.   Past Medical History  Diagnosis Date  . Hiatal hernia   . Diverticulitis   . PONV (postoperative nausea and vomiting)   . Shortness of breath     with exertion  . Arthritis     both shoulders  . Joint pain     both shoulders  . Hemorrhoids     internal  . Diverticulosis   . Urinary incontinence   . Nocturia   . Urinary frequency   . Sinus drainage   . Cataracts, bilateral   . Cancer     left breast  . Nasal congestion   . Breast cancer   . Hyperlipidemia     takes Zocor daily  . Hypertension     takes Amlodipine daily  . Obesity   . Macular degeneration 02/2003  . Osteoporosis   . Atrial fibrillation    Current Outpatient Prescriptions on File Prior to Visit  Medication Sig Dispense Refill  . alendronate (FOSAMAX) 70 MG tablet Take 70 mg by mouth every 7 (seven) days.  Tuesday ; Take with a full glass of water on an empty stomach.      Marland Kitchen aspirin 81 MG tablet Take 81 mg by mouth daily.        . Calcium Carbonate-Vitamin D (CALTRATE 600+D) 600-400 MG-UNIT per tablet Take 1 tablet by mouth 2 (two) times daily.        . cholecalciferol (VITAMIN D) 400 UNITS TABS Take 400 Units by mouth daily. Take in addition to Caltrate with Vitamin D to get 1200 units of Vitamin D      . digoxin (LANOXIN) 0.125 MG tablet Take 0.125 mg by mouth daily.      . fish oil-omega-3 fatty acids 1000 MG capsule Take 1 g by mouth daily.       Marland Kitchen letrozole (FEMARA) 2.5 MG tablet Take 1 tablet (2.5 mg total) by mouth daily.  90 tablet  11  . magnesium gluconate (MAGONATE) 500 MG tablet Take 500 mg by mouth 2 (two) times daily.      . simvastatin (ZOCOR) 20 MG tablet Take 20 mg by mouth at bedtime.       . valsartan-hydrochlorothiazide (DIOVAN-HCT) 320-25 MG per tablet Take 1 tablet by mouth daily.       . vitamin  C (ASCORBIC ACID) 500 MG tablet Take 500 mg by mouth daily.      Marland Kitchen warfarin (COUMADIN) 5 MG tablet Take by mouth daily. 7.5 mg m/w/f 5mg  other days      . amLODipine (NORVASC) 5 MG tablet Take 5 mg by mouth daily.         No current facility-administered medications on file prior to visit.   No Known Allergies History   Social History  . Marital Status: Married    Spouse Name: N/A    Number of Children: N/A  . Years of Education: N/A   Occupational History  . retired    Social History Main Topics  . Smoking status: Former Smoker -- 0.25 packs/day for 10 years    Types: Cigarettes    Quit date: 05/21/1971  . Smokeless tobacco: Never Used     Comment: quit in the 60's  . Alcohol Use: No  . Drug Use: No  . Sexually Active: Not Currently   Other Topics Concern  . Not on file   Social History Narrative  . No narrative on file      Review of Systems     of systems is otherwise Objective:   Physical Exam  Cardiovascular: S1 normal and S2 normal.  An  irregularly irregular rhythm present. Exam reveals no gallop.   No murmur heard. Pulmonary/Chest: Effort normal and breath sounds normal.          Assessment & Plan:  1. A-fib Continue Coumadin to 5 mg every day except Mondays and Thursdays. On Mondays and Thursdays she is to take 2-1/2 mg.  Recheck INR in 4 weeks.

## 2012-10-18 ENCOUNTER — Other Ambulatory Visit: Payer: Self-pay | Admitting: Family Medicine

## 2012-10-31 ENCOUNTER — Ambulatory Visit (INDEPENDENT_AMBULATORY_CARE_PROVIDER_SITE_OTHER): Payer: Medicare Other | Admitting: Family Medicine

## 2012-10-31 ENCOUNTER — Encounter: Payer: Self-pay | Admitting: Family Medicine

## 2012-10-31 VITALS — BP 106/62 | HR 60 | Temp 98.0°F | Resp 18 | Wt 182.0 lb

## 2012-10-31 DIAGNOSIS — I4891 Unspecified atrial fibrillation: Secondary | ICD-10-CM | POA: Diagnosis not present

## 2012-10-31 NOTE — Progress Notes (Signed)
Subjective:    Patient ID: Kathy [name], female    DOB: 12-15-1927, 79 y.o.   MRN: [id number]  HPI 09/07/12 Patient is here for followup of her atrial fibrillation. With regards to her  atrial fibrillation, she is currently taking Lopressor 50 mg by mouth twice a day and digoxin 0.125 mg poqday.  Her rate is well controlled in the 60s. She denies any palpitations or presyncope. She is currently on Coumadin 7.5 mg Monday, Fridays. She takes 5 mg on Tuesday, Wednesday,Thursday, Saturday, Sunday.  inr is 4.0.  She denies any easy bruising or bleeding. She is scheduled for cardioversion in 1 week.  Therefore, my plan last time was: No coumadin tomorrow.  Then decrease coumadin to 5 mg poqday. We will recheck her INR in 1 week.  09/14/12 INR remains elevated at 3.5. She is asymptomatic. She denies any change in her diet or medication.  Therefore, change Coumadin to 5 mg every day except Mondays and Thursdays. On Mondays and Thursdays she is to take 2-1/2 mg.  Recheck INR in 2 weeks.  09/29/12 She is doing great. She failed cardioversion. Her INR today in clinic is 2.0. She denies any bleeding or bruising.  Therefore: Continue Coumadin to 5 mg every day except Mondays and Thursdays. On Mondays and Thursdays she is to take 2-1/2 mg.  Recheck INR in 4 weeks.  10/31/12 She is here today to recheck her INR.  She is currently on Coumadin to 5 mg every day except Mondays and Thursdays. On Mondays and Thursdays she is takes 2-1/2 mg. She also had questions about trying anti-arrhythmic meds to achieve rhythm control.  She is asymptomatic and cannot tell she is even in Afib at the present time.  She has failed cardioversion. Office Visit on 10/31/2012  Component Date Value Range Status  . PT, fingerstick 10/31/2012 20.6* 10.4 - 12.5 seconds Final  . INR, fingerstick 10/31/2012 1.7* 0.80 - 1.20 Final   Comment: The INR is of principal utility in following patients on stable doses                          of  oral anticoagulants.  The therapeutic range is generally 2.0 to                          3.0, but may be 3.0 to 4.0 in patients with mechanical cardiac valves,                          recurrent embolisms and antiphospholipid antibodies (including lupus                          inhibitors).   She denies any change in meds or diet.  Pulse is in the 60-70's.  She denies SOB, DOE, or syncope.   Past Medical History  Diagnosis Date  . Hiatal hernia   . Diverticulitis   . PONV (postoperative nausea and vomiting)   . Shortness of breath     with exertion  . Arthritis     both shoulders  . Joint pain     both shoulders  . Hemorrhoids     internal  . Diverticulosis   . Urinary incontinence   . Nocturia   . Urinary frequency   . Sinus drainage   . Cataracts, bilateral   . Cancer  left breast  . Nasal congestion   . Breast cancer   . Hyperlipidemia     takes Zocor daily  . Hypertension     takes Amlodipine daily  . Obesity   . Macular degeneration 02/2003  . Osteoporosis   . Atrial fibrillation    Current Outpatient Prescriptions on File Prior to Visit  Medication Sig Dispense Refill  . alendronate (FOSAMAX) 70 MG tablet Take 70 mg by mouth every 7 (seven) days. Tuesday ; Take with a full glass of water on an empty stomach.      Marland Kitchen aspirin 81 MG tablet Take 81 mg by mouth daily.        . Calcium Carbonate-Vitamin D (CALTRATE 600+D) 600-400 MG-UNIT per tablet Take 1 tablet by mouth 2 (two) times daily.        . cholecalciferol (VITAMIN D) 400 UNITS TABS Take 400 Units by mouth daily. Take in addition to Caltrate with Vitamin D to get 1200 units of Vitamin D      . digoxin (LANOXIN) 0.125 MG tablet Take 0.125 mg by mouth daily.      . fish oil-omega-3 fatty acids 1000 MG capsule Take 1 g by mouth daily.       Marland Kitchen letrozole (FEMARA) 2.5 MG tablet Take 1 tablet (2.5 mg total) by mouth daily.  90 tablet  11  . magnesium gluconate (MAGONATE) 500 MG tablet Take 500 mg by mouth 2 (two)  times daily.      . metoprolol tartrate (LOPRESSOR) 25 MG tablet Take 50 mg by mouth 2 (two) times daily.      . simvastatin (ZOCOR) 20 MG tablet Take 20 mg by mouth at bedtime.       . valsartan-hydrochlorothiazide (DIOVAN-HCT) 320-25 MG per tablet Take 1 tablet by mouth daily.       . vitamin C (ASCORBIC ACID) 500 MG tablet Take 500 mg by mouth daily.       No current facility-administered medications on file prior to visit.   No Known Allergies History   Social History  . Marital Status: Married    Spouse Name: N/A    Number of Children: N/A  . Years of Education: N/A   Occupational History  . retired    Social History Main Topics  . Smoking status: Former Smoker -- 0.25 packs/day for 10 years    Types: Cigarettes    Quit date: 05/21/1971  . Smokeless tobacco: Never Used     Comment: quit in the 60's  . Alcohol Use: No  . Drug Use: No  . Sexually Active: Not Currently   Other Topics Concern  . Not on file   Social History Narrative  . No narrative on file      Review of Systems     of systems is otherwise Objective:   Physical Exam  Cardiovascular: S1 normal and S2 normal.  An irregularly irregular rhythm present. Exam reveals no gallop.   No murmur heard. Pulmonary/Chest: Effort normal and breath sounds normal.          Assessment & Plan:  1. A-fib Change coumadin to 5 mg everyday except Thursdays when she will take 2.5 mg.  Recheck INR in 2 weeks.  Continue rate control with digoxin 0.125 mg poqday and lopressor 50mg  pobid.  I answered her questions as best I could about anti-arrhythmic meds such as sotalol and amiodarone.  Given the fact she is asymptomatic and there is no mortality benefit from rhythm control, I recommended  against that course of action.

## 2012-10-31 NOTE — Patient Instructions (Addendum)
Take an additional 2.5 mg today, and then start taking 5 mg everyday except Thursdays (ONLY) and on Thursdays take 2.5 mg.  Recheck in 2 weeks.

## 2012-11-14 ENCOUNTER — Ambulatory Visit (INDEPENDENT_AMBULATORY_CARE_PROVIDER_SITE_OTHER): Payer: Medicare Other | Admitting: Family Medicine

## 2012-11-14 ENCOUNTER — Encounter: Payer: Self-pay | Admitting: Family Medicine

## 2012-11-14 VITALS — BP 100/52 | HR 64 | Temp 98.1°F | Resp 16 | Wt 183.0 lb

## 2012-11-14 DIAGNOSIS — I4891 Unspecified atrial fibrillation: Secondary | ICD-10-CM

## 2012-11-14 NOTE — Progress Notes (Signed)
Subjective:    Patient ID: Kathy [name], female    DOB: [birth year], 79 y.o.   MRN: [id number]  HPI 09/07/12 Patient is here for followup of her atrial fibrillation. With regards to her  atrial fibrillation, she is currently taking Lopressor 50 mg by mouth twice a day and digoxin 0.125 mg poqday.  Her rate is well controlled in the 60s. She denies any palpitations or presyncope. She is currently on Coumadin 7.5 mg Monday, Fridays. She takes 5 mg on Tuesday, Wednesday,Thursday, Saturday, Sunday.  inr is 4.0.  She denies any easy bruising or bleeding. She is scheduled for cardioversion in 1 week.  Therefore, my plan last time was: No coumadin tomorrow.  Then decrease coumadin to 5 mg poqday. We will recheck her INR in 1 week.  09/14/12 INR remains elevated at 3.5. She is asymptomatic. She denies any change in her diet or medication.  Therefore, change Coumadin to 5 mg every day except Mondays and Thursdays. On Mondays and Thursdays she is to take 2-1/2 mg.  Recheck INR in 2 weeks.  09/29/12 She is doing great. She failed cardioversion. Her INR today in clinic is 2.0. She denies any bleeding or bruising.  Therefore: Continue Coumadin to 5 mg every day except Mondays and Thursdays. On Mondays and Thursdays she is to take 2-1/2 mg.  Recheck INR in 4 weeks.  10/31/12 She is here today to recheck her INR.  She is currently on Coumadin to 5 mg every day except Mondays and Thursdays. On Mondays and Thursdays she is takes 2-1/2 mg. She also had questions about trying anti-arrhythmic meds to achieve rhythm control.  She is asymptomatic and cannot tell she is even in Afib at the present time.  She has failed cardioversion.  At that time, my plan was: 1. A-fib Change coumadin to 5 mg everyday except Thursdays when she will take 2.5 mg.  Recheck INR in 2 weeks.  Continue rate control with digoxin 0.125 mg poqday and lopressor 50mg  pobid.  I answered her questions as best I could about anti-arrhythmic meds such as  sotalol and amiodarone.  Given the fact she is asymptomatic and there is no mortality benefit from rhythm control, I recommended against that course of action.  11/14/12 She is here today for followup. She is currently taking Coumadin 5 mg every day except on Thursdays when she takes 2.5 mg. She denies any bleeding or bruising. INR today in clinic is 2.2. Office Visit on 11/14/2012  Component Date Value Range Status  . PT, fingerstick 11/14/2012 27.0* 10.4 - 12.5 seconds Final  . INR, fingerstick 11/14/2012 2.2* 0.80 - 1.20 Final   Comment: The INR is of principal utility in following patients on stable doses                          of oral anticoagulants.  The therapeutic range is generally 2.0 to                          3.0, but may be 3.0 to 4.0 in patients with mechanical cardiac valves,                          recurrent embolisms and antiphospholipid antibodies (including lupus                          inhibitors).  She denies any change in meds or diet.  Pulse is in the 60-70's.  She denies SOB, DOE, or syncope.   Past Medical History  Diagnosis Date  . Hiatal hernia   . Diverticulitis   . PONV (postoperative nausea and vomiting)   . Shortness of breath     with exertion  . Arthritis     both shoulders  . Joint pain     both shoulders  . Hemorrhoids     internal  . Diverticulosis   . Urinary incontinence   . Nocturia   . Urinary frequency   . Sinus drainage   . Cataracts, bilateral   . Cancer     left breast  . Nasal congestion   . Breast cancer   . Hyperlipidemia     takes Zocor daily  . Hypertension     takes Amlodipine daily  . Obesity   . Macular degeneration 02/2003  . Osteoporosis   . Atrial fibrillation    Current Outpatient Prescriptions on File Prior to Visit  Medication Sig Dispense Refill  . alendronate (FOSAMAX) 70 MG tablet Take 70 mg by mouth every 7 (seven) days. Tuesday ; Take with a full glass of water on an empty stomach.      Marland Kitchen aspirin  81 MG tablet Take 81 mg by mouth daily.        . Calcium Carbonate-Vitamin D (CALTRATE 600+D) 600-400 MG-UNIT per tablet Take 1 tablet by mouth 2 (two) times daily.        . cholecalciferol (VITAMIN D) 400 UNITS TABS Take 400 Units by mouth daily. Take in addition to Caltrate with Vitamin D to get 1200 units of Vitamin D      . digoxin (LANOXIN) 0.125 MG tablet Take 0.125 mg by mouth daily.      . fish oil-omega-3 fatty acids 1000 MG capsule Take 1 g by mouth daily.       Marland Kitchen letrozole (FEMARA) 2.5 MG tablet Take 1 tablet (2.5 mg total) by mouth daily.  90 tablet  11  . magnesium gluconate (MAGONATE) 500 MG tablet Take 500 mg by mouth 2 (two) times daily.      . metoprolol tartrate (LOPRESSOR) 25 MG tablet Take 50 mg by mouth 2 (two) times daily.      . simvastatin (ZOCOR) 20 MG tablet Take 20 mg by mouth at bedtime.       . valsartan-hydrochlorothiazide (DIOVAN-HCT) 320-25 MG per tablet Take 1 tablet by mouth daily.       . vitamin C (ASCORBIC ACID) 500 MG tablet Take 500 mg by mouth daily.      Marland Kitchen warfarin (COUMADIN) 5 MG tablet 2.5 mg thurs - and 5 mg other days       No current facility-administered medications on file prior to visit.   No Known Allergies History   Social History  . Marital Status: Married    Spouse Name: N/A    Number of Children: N/A  . Years of Education: N/A   Occupational History  . retired    Social History Main Topics  . Smoking status: Former Smoker -- 0.25 packs/day for 10 years    Types: Cigarettes    Quit date: 05/21/1971  . Smokeless tobacco: Never Used     Comment: quit in the 60's  . Alcohol Use: No  . Drug Use: No  . Sexually Active: Not Currently   Other Topics Concern  . Not on file   Social History Narrative  .  No narrative on file      Review of Systems     of systems is otherwise Objective:   Physical Exam  Cardiovascular: S1 normal and S2 normal.  An irregularly irregular rhythm present. Exam reveals no gallop.   No murmur  heard. Pulmonary/Chest: Effort normal and breath sounds normal.          Assessment & Plan:    1. A-fib Continue Coumadin 5 mg every day except Thursday. On Thursday take 2.5 mg. Recheck INR in one month. - PT with INR/Fingerstick

## 2012-11-29 ENCOUNTER — Other Ambulatory Visit (HOSPITAL_BASED_OUTPATIENT_CLINIC_OR_DEPARTMENT_OTHER): Payer: Medicare Other | Admitting: Lab

## 2012-11-29 ENCOUNTER — Encounter: Payer: Self-pay | Admitting: Physician Assistant

## 2012-11-29 ENCOUNTER — Telehealth: Payer: Self-pay | Admitting: *Deleted

## 2012-11-29 ENCOUNTER — Ambulatory Visit (HOSPITAL_BASED_OUTPATIENT_CLINIC_OR_DEPARTMENT_OTHER): Payer: Medicare Other | Admitting: Physician Assistant

## 2012-11-29 VITALS — BP 118/75 | HR 90 | Temp 98.2°F | Resp 20 | Ht 60.0 in | Wt 183.3 lb

## 2012-11-29 DIAGNOSIS — Z780 Asymptomatic menopausal state: Secondary | ICD-10-CM

## 2012-11-29 DIAGNOSIS — Z170 Estrogen receptor positive status [ER+]: Secondary | ICD-10-CM | POA: Diagnosis not present

## 2012-11-29 DIAGNOSIS — I4891 Unspecified atrial fibrillation: Secondary | ICD-10-CM | POA: Diagnosis not present

## 2012-11-29 DIAGNOSIS — M949 Disorder of cartilage, unspecified: Secondary | ICD-10-CM | POA: Diagnosis not present

## 2012-11-29 DIAGNOSIS — M8580 Other specified disorders of bone density and structure, unspecified site: Secondary | ICD-10-CM | POA: Insufficient documentation

## 2012-11-29 DIAGNOSIS — C50912 Malignant neoplasm of unspecified site of left female breast: Secondary | ICD-10-CM

## 2012-11-29 DIAGNOSIS — C50519 Malignant neoplasm of lower-outer quadrant of unspecified female breast: Secondary | ICD-10-CM

## 2012-11-29 DIAGNOSIS — M899 Disorder of bone, unspecified: Secondary | ICD-10-CM | POA: Diagnosis not present

## 2012-11-29 DIAGNOSIS — C50919 Malignant neoplasm of unspecified site of unspecified female breast: Secondary | ICD-10-CM

## 2012-11-29 DIAGNOSIS — Z853 Personal history of malignant neoplasm of breast: Secondary | ICD-10-CM

## 2012-11-29 LAB — COMPREHENSIVE METABOLIC PANEL (CC13)
BUN: 20.8 mg/dL (ref 7.0–26.0)
CO2: 28 mEq/L (ref 22–29)
Glucose: 107 mg/dl (ref 70–140)
Sodium: 141 mEq/L (ref 136–145)
Total Bilirubin: 0.55 mg/dL (ref 0.20–1.20)
Total Protein: 6.5 g/dL (ref 6.4–8.3)

## 2012-11-29 LAB — CBC WITH DIFFERENTIAL/PLATELET
Basophils Absolute: 0.1 10*3/uL (ref 0.0–0.1)
Eosinophils Absolute: 0.2 10*3/uL (ref 0.0–0.5)
HCT: 38.8 % (ref 34.8–46.6)
HGB: 13 g/dL (ref 11.6–15.9)
LYMPH%: 33.5 % (ref 14.0–49.7)
MCV: 94.9 fL (ref 79.5–101.0)
MONO#: 0.6 10*3/uL (ref 0.1–0.9)
MONO%: 8.1 % (ref 0.0–14.0)
NEUT#: 4.1 10*3/uL (ref 1.5–6.5)
NEUT%: 55.3 % (ref 38.4–76.8)
Platelets: 218 10*3/uL (ref 145–400)
RBC: 4.09 10*6/uL (ref 3.70–5.45)
WBC: 7.5 10*3/uL (ref 3.9–10.3)

## 2012-11-29 NOTE — Progress Notes (Signed)
ID: Kathy [name]   DOB: 05-29-1927  MR#: [id number]  IRJ#:[id number]   PCP:  Leo Grosser, MD GYN: SU:  Lodema Pilot, MD Other:      HISTORY OF PRESENT ILLNESS: The patient is an 79 year old [place] woman who had routine yearly screening mammography at [facility] 03/23/2011. A possible area of distortion was noted in the upper outer quadrant of the left breast and she was recalled for additional views November 29. There was a stellate mass in the outer lower quadrant of the left breast which appears more prominent. By ultrasound there was a suggestion of a 1 cm mass at the site of the scar.   [facility] was performed 03/31/2011 1 showed a focal intense isotopic area of activity measuring 1.6 cm at the site in question. Biopsy of this area was performed 04/07/2011 and showed [phone]) and invasive ductal carcinoma, grade 2, which was estrogen receptor positive at 100%, progesterone receptor positive at 69%, with an MIB-1-1 of 10%, and no HER-2 amplification.   The patient had her left lumpectomy, 05/06/2011. This showed multiple positive margins, so on 05/28/2011 she went back of to the operating table for margin clearance. This was successfully accomplished.   Her subsequent history is as detailed below.  INTERVAL HISTORY: Kathy [name] returns alone today for followup of her left breast carcinoma. The interval history is unremarkable. She continues on letrozole with good tolerance. She has no significant hot flashes. She has no vaginal dryness, and denies any vaginal bleeding. She continues to have chronic pain in the right shoulder and upper back, both of which are stable. She's had no increased joint pain since beginning the letrozole.   REVIEW OF SYSTEMS: Sheela denies any recent illnesses and has had no fevers or chills. Her energy level is "okay" and she still does all her normal activities at home. Her appetite is good. She denies any nausea or change in bowel or bladder habits. She's had no  abnormal bleeding. She denies any increased cough, shortness of breath, chest pain, or palpitations. She's had no abnormal headaches or dizziness. She currently denies any additional myalgias, arthralgias, or bony pain.  A detailed review of systems is otherwise noncontributory.    PAST MEDICAL HISTORY: Past Medical History  Diagnosis Date  . Hiatal hernia   . Diverticulitis   . PONV (postoperative nausea and vomiting)   . Shortness of breath     with exertion  . Arthritis     both shoulders  . Joint pain     both shoulders  . Hemorrhoids     internal  . Diverticulosis   . Urinary incontinence   . Nocturia   . Urinary frequency   . Sinus drainage   . Cataracts, bilateral   . Cancer     left breast  . Nasal congestion   . Breast cancer   . Hyperlipidemia     takes Zocor daily  . Hypertension     takes Amlodipine daily  . Obesity   . Macular degeneration 02/2003  . Osteoporosis   . Atrial fibrillation     PAST SURGICAL HISTORY: Past Surgical History  Procedure Laterality Date  . Left breast biopsy  1984  . Right breast biopsy  1993  . Bilateral knee replacements  2008  . Hemorrhoid surgery      outpatient "done in doctor's office"  . Colonoscopy    . Esophagogastroduodenoscopy    . Breast biopsy  05/04/2011    Procedure: BREAST BIOPSY WITH NEEDLE LOCALIZATION;  Surgeon: Rulon Abide, DO;  Location: [facility] OR;  Service: General;  Laterality: Left;  . Joint replacement  2008    bilateral knee replacement  . Cardioversion N/A 09/15/2012    Procedure: CARDIOVERSION;  Surgeon: Pamella Pert, MD;  Location: [facility] ENDOSCOPY;  Service: Cardiovascular;  Laterality: N/A;  h&p in file-HW    FAMILY HISTORY Family History  Problem Relation Age of Onset  . Cancer Mother     pancreatic  . Cancer Father     esophageal  . Cancer Sister     ovarian  . Cancer Sister     lung  . Cancer Brother     kidney  . Cancer Sister     breast cancer  . Anesthesia problems Neg  Hx   . Hypotension Neg Hx   . Malignant hyperthermia Neg Hx   . Pseudochol deficiency Neg Hx   . Heart disease Brother   . COPD Brother   . Cancer Brother     Kidney/Nephrectomy  The patient's father died from stomach or esophageal cancer at the age of 81. The patient's mother died from pancreas cancer at the age of 73 the patient has 4 sisters and 4 brothers. One brother died from kidney cancer and the other 3 from heart disease. One sister died of ovarian cancer which was diagnosed in her 48s. One sister died from "female organ cancer" diagnosed in her 35s. One sister died from lung cancer. The other surviving sister, currently 24, was just diagnosed with breast cancer.    Gynecologic history: GX P0. Menarche age 89, menopause in 67. She took hormone replacement approximately 8 years, stopping about 10 years ago. She had no complications from that treatment.   Social history: She used to work in a [employer], but is now retired. Her husband "S. L." Used to work in a Insurance claims handler. As noted, they have no children, and live by themselves. The patient drives, does all her cooking and a house chores, and is normally active for an 79 year old. She does not exercise regularly.  ADVANCED DIRECTIVES: No advanced directives in place   HEALTH MAINTENANCE: History  Substance Use Topics  . Smoking status: Former Smoker -- 0.25 packs/day for 10 years    Types: Cigarettes    Quit date: 05/21/1971  . Smokeless tobacco: Never Used     Comment: quit in the 60's  . Alcohol Use: No   Colonoscopy: 2006  PAP: 2000  Bone density: NOV 2012, lowest reading = T -1.2  Cholesterol: controlled w. Medication.     No Known Allergies  Current Outpatient Prescriptions  Medication Sig Dispense Refill  . alendronate (FOSAMAX) 70 MG tablet Take 70 mg by mouth every 7 (seven) days. Tuesday ; Take with a full glass of water on an empty stomach.      Marland Kitchen aspirin 81 MG tablet Take 81 mg by mouth daily.        .  Calcium Carbonate-Vitamin D (CALTRATE 600+D) 600-400 MG-UNIT per tablet Take 1 tablet by mouth 2 (two) times daily.        . cholecalciferol (VITAMIN D) 400 UNITS TABS Take 400 Units by mouth daily. Take in addition to Caltrate with Vitamin D to get 1200 units of Vitamin D      . digoxin (LANOXIN) 0.125 MG tablet Take 0.125 mg by mouth daily.      . fish oil-omega-3 fatty acids 1000 MG capsule Take 1 g by mouth daily.       Marland Kitchen  letrozole (FEMARA) 2.5 MG tablet Take 1 tablet (2.5 mg total) by mouth daily.  90 tablet  11  . magnesium gluconate (MAGONATE) 500 MG tablet Take 500 mg by mouth 2 (two) times daily.      . metoprolol tartrate (LOPRESSOR) 25 MG tablet Take 50 mg by mouth 2 (two) times daily.      . simvastatin (ZOCOR) 20 MG tablet Take 20 mg by mouth at bedtime.       . valsartan-hydrochlorothiazide (DIOVAN-HCT) 320-25 MG per tablet Take 1 tablet by mouth daily.       Marland Kitchen warfarin (COUMADIN) 5 MG tablet 2.5 mg thurs - and 5 mg other days       No current facility-administered medications for this visit.    OBJECTIVE: elderly white woman in no acute distress Filed Vitals:   11/29/12 1333  BP: 118/75  Pulse: 90  Temp: 98.2 F (36.8 C)  Resp: 20     Body mass index is 35.8 kg/(m^2).    ECOG FS: 1 Filed Weights   11/29/12 1333  Weight: 183 lb 4.8 oz (83.144 kg)   Sclera not icteric Oropharynx clear No cervical or supraclavicular adenopathy Lungs clear to auscultation bilaterally, no crackles or wheezes  Heart regular rate and rhythm Abdomen soft, nontender to palpation, positive bowel sounds, no organomegaly palpated Musculoskeletal exam shows mild kyphosis but no focal spinal tenderness.  Nonpitting pedal edema bilaterally, equal bilaterally. No upper extremity edema is noted. Neurologic exam is nonfocal, the patient is well oriented, with positive affect The right breast is unremarkable. The left breast is status post lumpectomy. There is significant distortion of the breast,  with a stable fluid collection which is not erythematous or tender. Axillae benign bilaterally, no palpable adenopathy noted.   LAB RESULTS: Lab Results  Component Value Date   WBC 7.5 11/29/2012   NEUTROABS 4.1 11/29/2012   HGB 13.0 11/29/2012   HCT 38.8 11/29/2012   MCV 94.9 11/29/2012   PLT 218 11/29/2012      Chemistry      Component Value Date/Time   NA 141 11/29/2012 1231   NA 144 09/15/2012 1225   K 4.6 11/29/2012 1231   K 4.0 09/15/2012 1225   CL 100 09/15/2012 1048   CL 102 05/06/2012 0959   CO2 28 11/29/2012 1231   CO2 22 09/15/2012 1048   BUN 20.8 11/29/2012 1231   BUN 18 09/15/2012 1048   CREATININE 1.0 11/29/2012 1231   CREATININE 0.82 09/15/2012 1048      Component Value Date/Time   CALCIUM 9.4 11/29/2012 1231   CALCIUM 9.5 09/15/2012 1048   ALKPHOS 59 11/29/2012 1231   ALKPHOS 52 04/15/2011 0829   AST 15 11/29/2012 1231   AST 13 04/15/2011 0829   ALT 10 11/29/2012 1231   ALT 10 04/15/2011 0829   BILITOT 0.55 11/29/2012 1231   BILITOT 0.3 04/15/2011 0829       STUDIES: Most recent bone density at [facility] in November 2012 showed osteopenia.  Most recent mammogram at [facility] on 04/05/2012 was unremarkable.   ASSESSMENT: 79 year old [place] woman   (1)  status post left lumpectomy with subsequent margin clearance January of 2013 for a T1c NX, (stage I) invasive ductal carcinoma, grade 2, strongly estrogen and progesterone receptor positive, with a low MIB-1, and no HER-2 amplification.   (2) on letrozole since February 2013.    PLAN: Yola appears to be doing very well with regards to her breast cancer, with no clinical evidence of disease  recurrence. She will continue on letrozole, the plan being to continue for a total of 5 years, until February 2018.  We will continue seeing Joannie  On an annual basis. She is due for her next mammogram and bone density at [facility] in December, and we will see her again next July or August, 2015.  She voices understanding and agreement with this plan  and will call with any changes prior to her next appointment.     Letzy Gullickson    11/29/2012

## 2012-11-29 NOTE — Telephone Encounter (Signed)
appts made and printed. gv appts for [facility] as well...td

## 2012-12-15 ENCOUNTER — Encounter: Payer: Self-pay | Admitting: Family Medicine

## 2012-12-15 ENCOUNTER — Ambulatory Visit (INDEPENDENT_AMBULATORY_CARE_PROVIDER_SITE_OTHER): Payer: Medicare Other | Admitting: Family Medicine

## 2012-12-15 VITALS — BP 100/60 | HR 60 | Temp 98.4°F | Resp 16 | Wt 183.0 lb

## 2012-12-15 DIAGNOSIS — I4891 Unspecified atrial fibrillation: Secondary | ICD-10-CM

## 2012-12-15 LAB — PT WITH INR/FINGERSTICK
INR, fingerstick: 2.5 — ABNORMAL HIGH (ref 0.80–1.20)
PT, fingerstick: 29.6 seconds — ABNORMAL HIGH (ref 10.4–12.5)

## 2012-12-15 NOTE — Progress Notes (Signed)
Subjective:    Patient ID: Kathy [name], female    DOB: 11-09-1927, 79 y.o.   MRN: [id number]  HPI 09/07/12 Patient is here for followup of her atrial fibrillation. With regards to her  atrial fibrillation, she is currently taking Lopressor 50 mg by mouth twice a day and digoxin 0.125 mg poqday.  Her rate is well controlled in the 60s. She denies any palpitations or presyncope. She is currently on Coumadin 7.5 mg Monday, Fridays. She takes 5 mg on Tuesday, Wednesday,Thursday, Saturday, Sunday.  inr is 4.0.  She denies any easy bruising or bleeding. She is scheduled for cardioversion in 1 week.  Therefore, my plan last time was: No coumadin tomorrow.  Then decrease coumadin to 5 mg poqday. We will recheck her INR in 1 week.  09/14/12 INR remains elevated at 3.5. She is asymptomatic. She denies any change in her diet or medication.  Therefore, change Coumadin to 5 mg every day except Mondays and Thursdays. On Mondays and Thursdays she is to take 2-1/2 mg.  Recheck INR in 2 weeks.  09/29/12 She is doing great. She failed cardioversion. Her INR today in clinic is 2.0. She denies any bleeding or bruising.  Therefore: Continue Coumadin to 5 mg every day except Mondays and Thursdays. On Mondays and Thursdays she is to take 2-1/2 mg.  Recheck INR in 4 weeks.  10/31/12 She is here today to recheck her INR.  She is currently on Coumadin to 5 mg every day except Mondays and Thursdays. On Mondays and Thursdays she is takes 2-1/2 mg. She also had questions about trying anti-arrhythmic meds to achieve rhythm control.  She is asymptomatic and cannot tell she is even in Afib at the present time.  She has failed cardioversion.  At that time, my plan was: 1. A-fib Change coumadin to 5 mg everyday except Thursdays when she will take 2.5 mg.  Recheck INR in 2 weeks.  Continue rate control with digoxin 0.125 mg poqday and lopressor 50mg  pobid.  I answered her questions as best I could about anti-arrhythmic meds such as  sotalol and amiodarone.  Given the fact she is asymptomatic and there is no mortality benefit from rhythm control, I recommended against that course of action.  11/14/12 She is here today for followup. She is currently taking Coumadin 5 mg every day except on Thursdays when she takes 2.5 mg. She denies any bleeding or bruising. INR today in clinic is 2.2.  At that time, my plan was: 1. A-fib Continue Coumadin 5 mg every day except Thursday. On Thursday take 2.5 mg. Recheck INR in one month. - PT with INR/Fingerstick   12/15/12 She is here today for a recheck.  She denies any change in meds or diet.  Pulse is in the 60-70's.  She denies SOB, DOE, or syncope.  INR today is 2.5. She is complaining of some pain in her right shoulder with crepitus. She has palpable crepitus on exam   Past Medical History  Diagnosis Date  . Hiatal hernia   . Diverticulitis   . PONV (postoperative nausea and vomiting)   . Shortness of breath     with exertion  . Arthritis     both shoulders  . Joint pain     both shoulders  . Hemorrhoids     internal  . Diverticulosis   . Urinary incontinence   . Nocturia   . Urinary frequency   . Sinus drainage   . Cataracts, bilateral   .  Cancer     left breast  . Nasal congestion   . Breast cancer   . Hyperlipidemia     takes Zocor daily  . Hypertension     takes Amlodipine daily  . Obesity   . Macular degeneration 02/2003  . Osteoporosis   . Atrial fibrillation    Current Outpatient Prescriptions on File Prior to Visit  Medication Sig Dispense Refill  . alendronate (FOSAMAX) 70 MG tablet Take 70 mg by mouth every 7 (seven) days. Tuesday ; Take with a full glass of water on an empty stomach.      Marland Kitchen aspirin 81 MG tablet Take 81 mg by mouth daily.        . Calcium Carbonate-Vitamin D (CALTRATE 600+D) 600-400 MG-UNIT per tablet Take 1 tablet by mouth 2 (two) times daily.        . cholecalciferol (VITAMIN D) 400 UNITS TABS Take 400 Units by mouth daily. Take in  addition to Caltrate with Vitamin D to get 1200 units of Vitamin D      . digoxin (LANOXIN) 0.125 MG tablet Take 0.125 mg by mouth daily.      . fish oil-omega-3 fatty acids 1000 MG capsule Take 1 g by mouth daily.       Marland Kitchen letrozole (FEMARA) 2.5 MG tablet Take 1 tablet (2.5 mg total) by mouth daily.  90 tablet  11  . magnesium gluconate (MAGONATE) 500 MG tablet Take 500 mg by mouth 2 (two) times daily.      . metoprolol tartrate (LOPRESSOR) 25 MG tablet Take 50 mg by mouth 2 (two) times daily.      . simvastatin (ZOCOR) 20 MG tablet Take 20 mg by mouth at bedtime.       . valsartan-hydrochlorothiazide (DIOVAN-HCT) 320-25 MG per tablet Take 1 tablet by mouth daily.       Marland Kitchen warfarin (COUMADIN) 5 MG tablet 2.5 mg thurs - and 5 mg other days       No current facility-administered medications on file prior to visit.   No Known Allergies History   Social History  . Marital Status: Married    Spouse Name: N/A    Number of Children: N/A  . Years of Education: N/A   Occupational History  . retired    Social History Main Topics  . Smoking status: Former Smoker -- 0.25 packs/day for 10 years    Types: Cigarettes    Quit date: 05/21/1971  . Smokeless tobacco: Never Used     Comment: quit in the 60's  . Alcohol Use: No  . Drug Use: No  . Sexual Activity: Not Currently   Other Topics Concern  . Not on file   Social History Narrative  . No narrative on file      Review of Systems     of systems is otherwise Objective:   Physical Exam  Cardiovascular: S1 normal and S2 normal.  An irregularly irregular rhythm present. Exam reveals no gallop.   No murmur heard. Pulmonary/Chest: Effort normal and breath sounds normal.    There is crepitus on examination of her right shoulder with range of motion. There is no palpable effusion or swelling.      Assessment & Plan:    1. A-fib Continue Coumadin 5 mg every day except Thursday. On Thursday take 2.5 mg. Recheck INR in one  month. - PT with INR/Fingerstick The shoulder pain most likely represents arthritis.  Given her chronic anticoagulation on Coumadin, I recommended Tylenol as  needed for pain. Tylenol does not control the pain she can return for is an injection as needed

## 2013-01-02 ENCOUNTER — Telehealth: Payer: Self-pay | Admitting: Family Medicine

## 2013-01-02 MED ORDER — SIMVASTATIN 20 MG PO TABS: 20.0000 mg | ORAL_TABLET | Freq: Every day | ORAL | Status: DC

## 2013-01-02 NOTE — Telephone Encounter (Signed)
Rx Refilled  

## 2013-01-02 NOTE — Telephone Encounter (Signed)
Simvastatin 20 mg tab 1 QHS #30 °

## 2013-01-06 ENCOUNTER — Other Ambulatory Visit: Payer: Self-pay | Admitting: Family Medicine

## 2013-01-06 NOTE — Telephone Encounter (Signed)
Medication refilled per protocol. 

## 2013-01-13 ENCOUNTER — Other Ambulatory Visit: Payer: Self-pay | Admitting: Family Medicine

## 2013-01-26 ENCOUNTER — Encounter: Payer: Self-pay | Admitting: Family Medicine

## 2013-01-26 ENCOUNTER — Ambulatory Visit (INDEPENDENT_AMBULATORY_CARE_PROVIDER_SITE_OTHER): Payer: Medicare Other | Admitting: Family Medicine

## 2013-01-26 VITALS — BP 104/62 | HR 60 | Temp 98.0°F | Resp 16 | Ht 60.0 in | Wt 181.0 lb

## 2013-01-26 DIAGNOSIS — I4891 Unspecified atrial fibrillation: Secondary | ICD-10-CM | POA: Diagnosis not present

## 2013-01-26 DIAGNOSIS — IMO0002 Reserved for concepts with insufficient information to code with codable children: Secondary | ICD-10-CM | POA: Diagnosis not present

## 2013-01-26 LAB — PT WITH INR/FINGERSTICK: PT, fingerstick: 31.7 seconds — ABNORMAL HIGH (ref 10.4–12.5)

## 2013-01-26 NOTE — Progress Notes (Signed)
Subjective:    Patient ID: Kathy [name], female    DOB: 05-02-1927, 78 y.o.   MRN: [id number]  HPI 09/07/12 Patient is here for followup of her atrial fibrillation. With regards to her  atrial fibrillation, she is currently taking Lopressor 50 mg by mouth twice a day and digoxin 0.125 mg poqday.  Her rate is well controlled in the 60s. She denies any palpitations or presyncope. She is currently on Coumadin 7.5 mg Monday, Fridays. She takes 5 mg on Tuesday, Wednesday,Thursday, Saturday, Sunday.  inr is 4.0.  She denies any easy bruising or bleeding. She is scheduled for cardioversion in 1 week.  Therefore, my plan last time was: No coumadin tomorrow.  Then decrease coumadin to 5 mg poqday. We will recheck her INR in 1 week.  09/14/12 INR remains elevated at 3.5. She is asymptomatic. She denies any change in her diet or medication.  Therefore, change Coumadin to 5 mg every day except Mondays and Thursdays. On Mondays and Thursdays she is to take 2-1/2 mg.  Recheck INR in 2 weeks.  09/29/12 She is doing great. She failed cardioversion. Her INR today in clinic is 2.0. She denies any bleeding or bruising.  Therefore: Continue Coumadin to 5 mg every day except Mondays and Thursdays. On Mondays and Thursdays she is to take 2-1/2 mg.  Recheck INR in 4 weeks.  10/31/12 She is here today to recheck her INR.  She is currently on Coumadin to 5 mg every day except Mondays and Thursdays. On Mondays and Thursdays she is takes 2-1/2 mg. She also had questions about trying anti-arrhythmic meds to achieve rhythm control.  She is asymptomatic and cannot tell she is even in Afib at the present time.  She has failed cardioversion.  At that time, my plan was: 1. A-fib Change coumadin to 5 mg everyday except Thursdays when she will take 2.5 mg.  Recheck INR in 2 weeks.  Continue rate control with digoxin 0.125 mg poqday and lopressor 50mg  pobid.  I answered her questions as best I could about anti-arrhythmic meds such as  sotalol and amiodarone.  Given the fact she is asymptomatic and there is no mortality benefit from rhythm control, I recommended against that course of action.  11/14/12 She is here today for followup. She is currently taking Coumadin 5 mg every day except on Thursdays when she takes 2.5 mg. She denies any bleeding or bruising. INR today in clinic is 2.2.  At that time, my plan was: 1. A-fib Continue Coumadin 5 mg every day except Thursday. On Thursday take 2.5 mg. Recheck INR in one month. - PT with INR/Fingerstick   12/15/12 She is here today for a recheck.  She denies any change in meds or diet.  Pulse is in the 60-70's.  She denies SOB, DOE, or syncope.  INR today is 2.5. She is complaining of some pain in her right shoulder with crepitus. She has palpable crepitus on exam.  At that time, my plan was: 1. A-fib Continue Coumadin 5 mg every day except Thursday. On Thursday take 2.5 mg. Recheck INR in one month. - PT with INR/Fingerstick The shoulder pain most likely represents arthritis.  Given her chronic anticoagulation on Coumadin, I recommended Tylenol as needed for pain. Tylenol does not control the pain she can return for is an injection as needed 01/26/13 Patient is here today for followup.  Her INR is 2.6 and therapeutic. She has had no change in her Coumadin dose. She continues  to have pain in her right shoulder. It hurts to abduct the shoulder greater than 90. She is unable to lift her arm over her head. It aches and throbs at night and causes her significant pain. She is requesting a cortisone injection.   Past Medical History  Diagnosis Date  . Hiatal hernia   . Diverticulitis   . PONV (postoperative nausea and vomiting)   . Shortness of breath     with exertion  . Arthritis     both shoulders  . Joint pain     both shoulders  . Hemorrhoids     internal  . Diverticulosis   . Urinary incontinence   . Nocturia   . Urinary frequency   . Sinus drainage   . Cataracts,  bilateral   . Cancer     left breast  . Nasal congestion   . Breast cancer   . Hyperlipidemia     takes Zocor daily  . Hypertension     takes Amlodipine daily  . Obesity   . Macular degeneration 02/2003  . Osteoporosis   . Atrial fibrillation    Current Outpatient Prescriptions on File Prior to Visit  Medication Sig Dispense Refill  . alendronate (FOSAMAX) 70 MG tablet TAKE ONE TABLET BY MOUTH ONCE A WEEK  4 tablet  11  . aspirin 81 MG tablet Take 81 mg by mouth daily.        . Calcium Carbonate-Vitamin D (CALTRATE 600+D) 600-400 MG-UNIT per tablet Take 1 tablet by mouth 2 (two) times daily.        . cholecalciferol (VITAMIN D) 400 UNITS TABS Take 400 Units by mouth daily. Take in addition to Caltrate with Vitamin D to get 1200 units of Vitamin D      . digoxin (LANOXIN) 0.125 MG tablet Take 0.125 mg by mouth daily.      . fish oil-omega-3 fatty acids 1000 MG capsule Take 1 g by mouth daily.       Marland Kitchen letrozole (FEMARA) 2.5 MG tablet Take 1 tablet (2.5 mg total) by mouth daily.  90 tablet  11  . magnesium gluconate (MAGONATE) 500 MG tablet Take 500 mg by mouth 2 (two) times daily.      . metoprolol tartrate (LOPRESSOR) 25 MG tablet Take 50 mg by mouth 2 (two) times daily.      . simvastatin (ZOCOR) 20 MG tablet Take 1 tablet (20 mg total) by mouth at bedtime.  30 tablet  5  . valsartan-hydrochlorothiazide (DIOVAN-HCT) 320-25 MG per tablet TAKE ONE TABLET BY MOUTH EVERY DAY  30 tablet  5  . warfarin (COUMADIN) 5 MG tablet 2.5 mg thurs - and 5 mg other days       No current facility-administered medications on file prior to visit.   No Known Allergies History   Social History  . Marital Status: Married    Spouse Name: N/A    Number of Children: N/A  . Years of Education: N/A   Occupational History  . retired    Social History Main Topics  . Smoking status: Former Smoker -- 0.25 packs/day for 10 years    Types: Cigarettes    Quit date: 05/21/1971  . Smokeless tobacco: Never  Used     Comment: quit in the 60's  . Alcohol Use: No  . Drug Use: No  . Sexual Activity: Not Currently   Other Topics Concern  . Not on file   Social History Narrative  . No narrative on  file      Review of Systems     of systems is otherwise Objective:   Physical Exam  Cardiovascular: S1 normal and S2 normal.  An irregularly irregular rhythm present. Exam reveals no gallop.   No murmur heard. Pulmonary/Chest: Effort normal and breath sounds normal.   Patient has a positive empty can sign in the right shoulder. She has pain with abduction greater than 70 she is unable to lift her arm greater than 90. She has a positive Hawkins sign       Assessment & Plan:   Problem #1 atrial fibrillation. Continue Coumadin at its present dose and recheck an INR in 6 weeks.  Problem #2 superspinatus tendinitis and possible tendon tear- we will treat conservatively with cortisone injection. Using sterile technique I injected the right subacromial space with 2 cc of lidocaine, 2 cc of Marcaine, and 2 cc of 40 mg per mL Kenalog. Recheck in one week if no better or sooner if worse.

## 2013-03-14 ENCOUNTER — Ambulatory Visit (INDEPENDENT_AMBULATORY_CARE_PROVIDER_SITE_OTHER): Payer: Medicare Other | Admitting: Family Medicine

## 2013-03-14 ENCOUNTER — Encounter: Payer: Self-pay | Admitting: Family Medicine

## 2013-03-14 VITALS — BP 110/64 | HR 62 | Temp 97.1°F | Resp 18 | Ht 60.0 in | Wt 178.0 lb

## 2013-03-14 DIAGNOSIS — I4891 Unspecified atrial fibrillation: Secondary | ICD-10-CM | POA: Diagnosis not present

## 2013-03-14 LAB — PT WITH INR/FINGERSTICK: PT, fingerstick: 41.7 seconds — ABNORMAL HIGH (ref 10.4–12.5)

## 2013-03-14 NOTE — Progress Notes (Signed)
Subjective:    Patient ID: Kathy [name], female    DOB: [birth year], 79 y.o.   MRN: [id number]  HPI Patient here for followup of her atrial fibrillation. She is currently on Coumadin 5 mg every day except Thursday. On Thursday she takes 2.5 mg. Her INR today is super therapeutic at 3.5.  She denies any changes in her medication. She denies any changes in her diet. She denies any bleeding or bruising. Unfortunately her husband's been admitted to a nursing home due to delirium after recent fall and urinary tract infection. Past Medical History  Diagnosis Date  . Hiatal hernia   . Diverticulitis   . PONV (postoperative nausea and vomiting)   . Shortness of breath     with exertion  . Arthritis     both shoulders  . Joint pain     both shoulders  . Hemorrhoids     internal  . Diverticulosis   . Urinary incontinence   . Nocturia   . Urinary frequency   . Sinus drainage   . Cataracts, bilateral   . Cancer     left breast  . Nasal congestion   . Breast cancer   . Hyperlipidemia     takes Zocor daily  . Hypertension     takes Amlodipine daily  . Obesity   . Macular degeneration 02/2003  . Osteoporosis   . Atrial fibrillation    Current Outpatient Prescriptions on File Prior to Visit  Medication Sig Dispense Refill  . alendronate (FOSAMAX) 70 MG tablet TAKE ONE TABLET BY MOUTH ONCE A WEEK  4 tablet  11  . aspirin 81 MG tablet Take 81 mg by mouth daily.        . Calcium Carbonate-Vitamin D (CALTRATE 600+D) 600-400 MG-UNIT per tablet Take 1 tablet by mouth 2 (two) times daily.        . cholecalciferol (VITAMIN D) 400 UNITS TABS Take 400 Units by mouth daily. Take in addition to Caltrate with Vitamin D to get 1200 units of Vitamin D      . digoxin (LANOXIN) 0.125 MG tablet Take 0.125 mg by mouth daily.      . fish oil-omega-3 fatty acids 1000 MG capsule Take 1 g by mouth daily.       Marland Kitchen letrozole (FEMARA) 2.5 MG tablet Take 1 tablet (2.5 mg total) by mouth daily.  90 tablet  11   . magnesium gluconate (MAGONATE) 500 MG tablet Take 500 mg by mouth 2 (two) times daily.      . metoprolol tartrate (LOPRESSOR) 25 MG tablet Take 50 mg by mouth 2 (two) times daily.      . simvastatin (ZOCOR) 20 MG tablet Take 1 tablet (20 mg total) by mouth at bedtime.  30 tablet  5  . valsartan-hydrochlorothiazide (DIOVAN-HCT) 320-25 MG per tablet TAKE ONE TABLET BY MOUTH EVERY DAY  30 tablet  5  . warfarin (COUMADIN) 5 MG tablet 2.5 mg thurs - and 5 mg other days       No current facility-administered medications on file prior to visit.   No Known Allergies History   Social History  . Marital Status: Married    Spouse Name: N/A    Number of Children: N/A  . Years of Education: N/A   Occupational History  . retired    Social History Main Topics  . Smoking status: Former Smoker -- 0.25 packs/day for 10 years    Types: Cigarettes    Quit date: 05/21/1971  .  Smokeless tobacco: Never Used     Comment: quit in the 60's  . Alcohol Use: No  . Drug Use: No  . Sexual Activity: Not Currently   Other Topics Concern  . Not on file   Social History Narrative  . No narrative on file      Review of Systems  All other systems reviewed and are negative.       Objective:   Physical Exam  Vitals reviewed. Cardiovascular: Normal rate and normal heart sounds.   Pulmonary/Chest: Effort normal and breath sounds normal.          Assessment & Plan:  1. A-fib Decrease Coumadin to 2.5 mg on Tuesday, Thursday, Saturday and 5 mg on Monday, Wednesday, Friday, Sunday. Recheck in one month. - PT with INR/Fingerstick

## 2013-03-22 DIAGNOSIS — I4891 Unspecified atrial fibrillation: Secondary | ICD-10-CM | POA: Diagnosis not present

## 2013-03-22 DIAGNOSIS — I10 Essential (primary) hypertension: Secondary | ICD-10-CM | POA: Diagnosis not present

## 2013-03-22 DIAGNOSIS — E785 Hyperlipidemia, unspecified: Secondary | ICD-10-CM | POA: Diagnosis not present

## 2013-04-06 DIAGNOSIS — M899 Disorder of bone, unspecified: Secondary | ICD-10-CM | POA: Diagnosis not present

## 2013-04-06 DIAGNOSIS — Z853 Personal history of malignant neoplasm of breast: Secondary | ICD-10-CM | POA: Diagnosis not present

## 2013-04-06 LAB — HM MAMMOGRAPHY

## 2013-04-12 ENCOUNTER — Encounter (INDEPENDENT_AMBULATORY_CARE_PROVIDER_SITE_OTHER): Payer: Self-pay | Admitting: General Surgery

## 2013-04-13 ENCOUNTER — Ambulatory Visit (INDEPENDENT_AMBULATORY_CARE_PROVIDER_SITE_OTHER): Payer: Medicare Other | Admitting: Family Medicine

## 2013-04-13 ENCOUNTER — Encounter: Payer: Self-pay | Admitting: Family Medicine

## 2013-04-13 VITALS — BP 110/60 | HR 62 | Temp 97.8°F | Resp 16 | Ht 60.0 in | Wt 176.0 lb

## 2013-04-13 DIAGNOSIS — I4891 Unspecified atrial fibrillation: Secondary | ICD-10-CM | POA: Diagnosis not present

## 2013-04-13 NOTE — Progress Notes (Signed)
Subjective:    Patient ID: Kathy [name], female    DOB: 27-Mar-1928, 79 y.o.   MRN: [id number]  HPI  Patient is here for a followup INR check. She is currently taking Coumadin for atrial fibrillation. Her heart rate is well controlled on metoprolol 25 mg by mouth twice a day. She denies any bleeding or bruising. She denies any change in her diet. Her INR is therapeutic today at 2.3. Unfortunately her husband recently passed away after a prolonged illness. The patient seems to be tolerating this as well as to be expected. Past Medical History  Diagnosis Date  . Hiatal hernia   . Diverticulitis   . PONV (postoperative nausea and vomiting)   . Shortness of breath     with exertion  . Arthritis     both shoulders  . Joint pain     both shoulders  . Hemorrhoids     internal  . Diverticulosis   . Urinary incontinence   . Nocturia   . Urinary frequency   . Sinus drainage   . Cataracts, bilateral   . Cancer     left breast  . Nasal congestion   . Breast cancer   . Hyperlipidemia     takes Zocor daily  . Hypertension     takes Amlodipine daily  . Obesity   . Macular degeneration 02/2003  . Osteoporosis   . Atrial fibrillation    Current Outpatient Prescriptions on File Prior to Visit  Medication Sig Dispense Refill  . alendronate (FOSAMAX) 70 MG tablet TAKE ONE TABLET BY MOUTH ONCE A WEEK  4 tablet  11  . aspirin 81 MG tablet Take 81 mg by mouth daily.        . Calcium Carbonate-Vitamin D (CALTRATE 600+D) 600-400 MG-UNIT per tablet Take 1 tablet by mouth 2 (two) times daily.        . cholecalciferol (VITAMIN D) 400 UNITS TABS Take 400 Units by mouth daily. Take in addition to Caltrate with Vitamin D to get 1200 units of Vitamin D      . digoxin (LANOXIN) 0.125 MG tablet Take 0.125 mg by mouth daily.      . fish oil-omega-3 fatty acids 1000 MG capsule Take 1 g by mouth daily.       Marland Kitchen letrozole (FEMARA) 2.5 MG tablet Take 1 tablet (2.5 mg total) by mouth daily.  90 tablet  11   . magnesium gluconate (MAGONATE) 500 MG tablet Take 500 mg by mouth 2 (two) times daily.      . metoprolol tartrate (LOPRESSOR) 25 MG tablet Take 50 mg by mouth 2 (two) times daily.      . simvastatin (ZOCOR) 20 MG tablet Take 1 tablet (20 mg total) by mouth at bedtime.  30 tablet  5  . valsartan-hydrochlorothiazide (DIOVAN-HCT) 320-25 MG per tablet TAKE ONE TABLET BY MOUTH EVERY DAY  30 tablet  5  . warfarin (COUMADIN) 5 MG tablet 2.5 mg Tues/Thurs/Sat - and 5 mg other days       No current facility-administered medications on file prior to visit.   No Known Allergies History   Social History  . Marital Status: Married    Spouse Name: N/A    Number of Children: N/A  . Years of Education: N/A   Occupational History  . retired    Social History Main Topics  . Smoking status: Former Smoker -- 0.25 packs/day for 10 years    Types: Cigarettes    Quit  date: 05/21/1971  . Smokeless tobacco: Never Used     Comment: quit in the 60's  . Alcohol Use: No  . Drug Use: No  . Sexual Activity: Not Currently   Other Topics Concern  . Not on file   Social History Narrative  . No narrative on file     Review of Systems  All other systems reviewed and are negative.       Objective:   Physical Exam  Vitals reviewed. Neck: Neck supple.  Cardiovascular: Normal rate, regular rhythm and normal heart sounds.  Exam reveals no gallop and no friction rub.   No murmur heard. Pulmonary/Chest: Effort normal and breath sounds normal. No respiratory distress. She has no wheezes. She has no rales. She exhibits no tenderness.  Abdominal: Soft. Bowel sounds are normal. She exhibits no distension. There is no tenderness. There is no rebound and no guarding.  Musculoskeletal: She exhibits no edema.  Lymphadenopathy:    She has no cervical adenopathy.          Assessment & Plan:  1. A-fib Continue Coumadin at its present dose. Recheck INR in 6 weeks. The patient also received Prevnar 13  today while she was in the office. - PT with INR/Fingerstick

## 2013-05-19 DIAGNOSIS — H524 Presbyopia: Secondary | ICD-10-CM | POA: Diagnosis not present

## 2013-05-19 DIAGNOSIS — H2510 Age-related nuclear cataract, unspecified eye: Secondary | ICD-10-CM | POA: Diagnosis not present

## 2013-05-19 DIAGNOSIS — H3530 Unspecified macular degeneration: Secondary | ICD-10-CM | POA: Diagnosis not present

## 2013-05-25 ENCOUNTER — Ambulatory Visit (INDEPENDENT_AMBULATORY_CARE_PROVIDER_SITE_OTHER): Payer: Medicare Other | Admitting: Family Medicine

## 2013-05-25 ENCOUNTER — Encounter: Payer: Self-pay | Admitting: Family Medicine

## 2013-05-25 VITALS — BP 110/60 | HR 64 | Temp 98.1°F | Resp 16 | Ht 60.0 in | Wt 181.0 lb

## 2013-05-25 DIAGNOSIS — I4891 Unspecified atrial fibrillation: Secondary | ICD-10-CM | POA: Diagnosis not present

## 2013-05-25 LAB — PT WITH INR/FINGERSTICK
INR, fingerstick: 1.7 — ABNORMAL HIGH (ref 0.80–1.20)
PT, fingerstick: 19.9 seconds — ABNORMAL HIGH (ref 10.4–12.5)

## 2013-05-25 NOTE — Progress Notes (Signed)
Subjective:    Patient ID: Kathy [name], female    DOB: July 18, 1927, 80 y.o.   MRN: [id number]  HPI  04/13/13 Patient is here for a followup INR check. She is currently taking Coumadin for atrial fibrillation. Her heart rate is well controlled on metoprolol 25 mg by mouth twice a day. She denies any bleeding or bruising. She denies any change in her diet. Her INR is therapeutic today at 2.3. Unfortunately her husband recently passed away after a prolonged illness. The patient seems to be tolerating this as well as to be expected.  At that time, my plan was: 1. A-fib Continue Coumadin at its present dose. Recheck INR in 6 weeks. The patient also received Prevnar 13 today while she was in the office. - PT with INR/Fingerstick   She presents today for a recheck. Her INR is subtherapeutic at 1.7. However earlier this week she missed a dose of the Coumadin after she fell and was bruising substantially around the left eye. She denies any change in medication or her diet. Past Medical History  Diagnosis Date  . Hiatal hernia   . Diverticulitis   . PONV (postoperative nausea and vomiting)   . Shortness of breath     with exertion  . Arthritis     both shoulders  . Joint pain     both shoulders  . Hemorrhoids     internal  . Diverticulosis   . Urinary incontinence   . Nocturia   . Urinary frequency   . Sinus drainage   . Cataracts, bilateral   . Cancer     left breast  . Nasal congestion   . Breast cancer   . Hyperlipidemia     takes Zocor daily  . Hypertension     takes Amlodipine daily  . Obesity   . Macular degeneration 02/2003  . Osteoporosis   . Atrial fibrillation    Current Outpatient Prescriptions on File Prior to Visit  Medication Sig Dispense Refill  . alendronate (FOSAMAX) 70 MG tablet TAKE ONE TABLET BY MOUTH ONCE A WEEK  4 tablet  11  . aspirin 81 MG tablet Take 81 mg by mouth daily.        . Calcium Carbonate-Vitamin D (CALTRATE 600+D) 600-400 MG-UNIT per  tablet Take 1 tablet by mouth 2 (two) times daily.        . cholecalciferol (VITAMIN D) 400 UNITS TABS Take 400 Units by mouth daily. Take in addition to Caltrate with Vitamin D to get 1200 units of Vitamin D      . digoxin (LANOXIN) 0.125 MG tablet Take 0.125 mg by mouth daily. None on Sat or Sun      . fish oil-omega-3 fatty acids 1000 MG capsule Take 1 g by mouth daily.       Marland Kitchen letrozole (FEMARA) 2.5 MG tablet Take 1 tablet (2.5 mg total) by mouth daily.  90 tablet  11  . magnesium gluconate (MAGONATE) 500 MG tablet Take 500 mg by mouth 2 (two) times daily.      . metoprolol tartrate (LOPRESSOR) 25 MG tablet Take 50 mg by mouth 2 (two) times daily.      . simvastatin (ZOCOR) 20 MG tablet Take 1 tablet (20 mg total) by mouth at bedtime.  30 tablet  5  . valsartan-hydrochlorothiazide (DIOVAN-HCT) 320-25 MG per tablet TAKE ONE TABLET BY MOUTH EVERY DAY  30 tablet  5  . warfarin (COUMADIN) 5 MG tablet 2.5 mg Tues/Thurs/Sat - and  5 mg other days       No current facility-administered medications on file prior to visit.   No Known Allergies History   Social History  . Marital Status: Married    Spouse Name: N/A    Number of Children: N/A  . Years of Education: N/A   Occupational History  . retired    Social History Main Topics  . Smoking status: Former Smoker -- 0.25 packs/day for 10 years    Types: Cigarettes    Quit date: 05/21/1971  . Smokeless tobacco: Never Used     Comment: quit in the 60's  . Alcohol Use: No  . Drug Use: No  . Sexual Activity: Not Currently   Other Topics Concern  . Not on file   Social History Narrative  . No narrative on file     Review of Systems  All other systems reviewed and are negative.       Objective:   Physical Exam  Vitals reviewed. Neck: Neck supple.  Cardiovascular: Normal rate, regular rhythm and normal heart sounds.  Exam reveals no gallop and no friction rub.   No murmur heard. Pulmonary/Chest: Effort normal and breath  sounds normal. No respiratory distress. She has no wheezes. She has no rales. She exhibits no tenderness.  Abdominal: Soft. Bowel sounds are normal. She exhibits no distension. There is no tenderness. There is no rebound and no guarding.  Musculoskeletal: She exhibits no edema.  Lymphadenopathy:    She has no cervical adenopathy.          Assessment & Plan:  A-fib - Plan: PT with INR/Fingerstick  Take 5 mg of Coumadin today then resume her previous schedule of 2.5 mg on Tuesday Thursday and Saturday and 5 mg on all other days. Recheck INR in 4 weeks. She has previously been well controlled at this dose and I hesitate to make changes now particularly her having missed a dose earlier this week.

## 2013-05-26 ENCOUNTER — Encounter (INDEPENDENT_AMBULATORY_CARE_PROVIDER_SITE_OTHER): Payer: Self-pay | Admitting: General Surgery

## 2013-05-26 ENCOUNTER — Ambulatory Visit (INDEPENDENT_AMBULATORY_CARE_PROVIDER_SITE_OTHER): Payer: Medicare Other | Admitting: General Surgery

## 2013-05-26 ENCOUNTER — Encounter (INDEPENDENT_AMBULATORY_CARE_PROVIDER_SITE_OTHER): Payer: Self-pay

## 2013-05-26 VITALS — BP 120/78 | HR 70 | Temp 97.9°F | Resp 16 | Ht 60.0 in | Wt 181.6 lb

## 2013-05-26 DIAGNOSIS — C50919 Malignant neoplasm of unspecified site of unspecified female breast: Secondary | ICD-10-CM

## 2013-05-26 NOTE — Progress Notes (Signed)
Subjective:     Patient ID: Kathy [name], female   DOB: [birth year], 80 y.o.   MRN: [id number]  HPI The patient is an 80 year old white female who is approximately 2 years status post left breast lumpectomy for a T1 C. NX left breast cancer by Dr. Lilyan Punt. She was ER and PR positive and HER-2/neu negative with a Ki-67 of 10%. She is taking Femara and tolerating it well. She has no complaints today.  Review of Systems  Constitutional: Negative.   HENT: Negative.   Eyes: Negative.   Respiratory: Negative.   Cardiovascular: Negative.   Gastrointestinal: Negative.   Endocrine: Negative.   Genitourinary: Negative.   Musculoskeletal: Negative.   Skin: Negative.   Allergic/Immunologic: Negative.   Neurological: Negative.   Hematological: Negative.   Psychiatric/Behavioral: Negative.        Objective:   Physical Exam  Constitutional: She is oriented to person, place, and time. She appears well-developed and well-nourished.  HENT:  Head: Normocephalic and atraumatic.  Eyes: Conjunctivae and EOM are normal. Pupils are equal, round, and reactive to light.  Neck: Normal range of motion. Neck supple.  Cardiovascular: Normal rate, regular rhythm and normal heart sounds.   Pulmonary/Chest: Effort normal and breath sounds normal.  There is some palpable firmness at the scar in the lateral left breast. Otherwise there is no palpable mass in either breast. There is no palpable axillary, supraclavicular, or cervical lymphadenopathy.  Abdominal: Soft. Bowel sounds are normal.  Musculoskeletal: Normal range of motion.  Lymphadenopathy:    She has no cervical adenopathy.  Neurological: She is alert and oriented to person, place, and time.  Skin: Skin is warm and dry.  Psychiatric: She has a normal mood and affect. Her behavior is normal.       Assessment:     The patient is 2 years status post left breast lumpectomy for breast cancer     Plan:     At this point she will continue to take  Femara. She will continue to do regular self exams. I will plan to see her back in about 6 months.

## 2013-05-26 NOTE — Patient Instructions (Signed)
Continue femara Continue regular self exams 

## 2013-06-26 ENCOUNTER — Ambulatory Visit (INDEPENDENT_AMBULATORY_CARE_PROVIDER_SITE_OTHER): Payer: Medicare Other | Admitting: Family Medicine

## 2013-06-26 ENCOUNTER — Encounter: Payer: Self-pay | Admitting: Family Medicine

## 2013-06-26 VITALS — BP 118/74 | HR 86 | Temp 97.5°F | Resp 18 | Ht 60.0 in | Wt 183.0 lb

## 2013-06-26 DIAGNOSIS — I4891 Unspecified atrial fibrillation: Secondary | ICD-10-CM

## 2013-06-26 LAB — PT WITH INR/FINGERSTICK
INR, fingerstick: 2.1 — ABNORMAL HIGH (ref 0.80–1.20)
PT FINGERSTICK: 25.3 s — AB (ref 10.4–12.5)

## 2013-06-26 NOTE — Progress Notes (Signed)
Subjective:    Patient ID: Kathy [name], female    DOB: Apr 10, 1928, 80 y.o.   MRN: [id number]  HPI  04/13/13 Patient is here for a followup INR check. She is currently taking Coumadin for atrial fibrillation. Her heart rate is well controlled on metoprolol 25 mg by mouth twice a day. She denies any bleeding or bruising. She denies any change in her diet. Her INR is therapeutic today at 2.3. Unfortunately her husband recently passed away after a prolonged illness. The patient seems to be tolerating this as well as to be expected.  At that time, my plan was: 1. A-fib Continue Coumadin at its present dose. Recheck INR in 6 weeks. The patient also received Prevnar 13 today while she was in the office. - PT with INR/Fingerstick  05/25/13 She presents today for a recheck. Her INR is subtherapeutic at 1.7. However earlier this week she missed a dose of the Coumadin after she fell and was bruising substantially around the left eye. She denies any change in medication or her diet.  At that time, my plan was: Atrial fibrillation  A-fib - Plan: PT with INR/Fingerstick  Take 5 mg of Coumadin today then resume her previous schedule of 2.5 mg on Tuesday Thursday and Saturday and 5 mg on all other days. Recheck INR in 4 weeks. She has previously been well controlled at this dose and I hesitate to make changes now particularly her having missed a dose earlier this week.  She is here today followup. She is currently taking 5 mg of Coumadin everyday except Tuesday Thursday and Saturdays. On those days she takes 2.5 mg of Coumadin. She denies any bleeding or bruising. Past Medical History  Diagnosis Date  . Hiatal hernia   . Diverticulitis   . PONV (postoperative nausea and vomiting)   . Shortness of breath     with exertion  . Arthritis     both shoulders  . Joint pain     both shoulders  . Hemorrhoids     internal  . Diverticulosis   . Urinary incontinence   . Nocturia   . Urinary frequency     . Sinus drainage   . Cataracts, bilateral   . Cancer     left breast  . Nasal congestion   . Breast cancer   . Hyperlipidemia     takes Zocor daily  . Hypertension     takes Amlodipine daily  . Obesity   . Macular degeneration 02/2003  . Osteoporosis   . Atrial fibrillation    Current Outpatient Prescriptions on File Prior to Visit  Medication Sig Dispense Refill  . Calcium Carbonate-Vitamin D (CALTRATE 600+D) 600-400 MG-UNIT per tablet Take 1 tablet by mouth 2 (two) times daily.        . cholecalciferol (VITAMIN D) 400 UNITS TABS Take 400 Units by mouth daily. Take in addition to Caltrate with Vitamin D to get 1200 units of Vitamin D      . digoxin (LANOXIN) 0.125 MG tablet Take 0.125 mg by mouth daily. None on Sat or Sun      . letrozole (FEMARA) 2.5 MG tablet Take 1 tablet (2.5 mg total) by mouth daily.  90 tablet  11  . magnesium gluconate (MAGONATE) 500 MG tablet Take 500 mg by mouth 2 (two) times daily.      . metoprolol tartrate (LOPRESSOR) 25 MG tablet Take 50 mg by mouth 2 (two) times daily.      . simvastatin (ZOCOR)  20 MG tablet Take 1 tablet (20 mg total) by mouth at bedtime.  30 tablet  5  . valsartan-hydrochlorothiazide (DIOVAN-HCT) 320-25 MG per tablet TAKE ONE TABLET BY MOUTH EVERY DAY  30 tablet  5  . warfarin (COUMADIN) 5 MG tablet 2.5 mg Tues/Thurs/Sat - and 5 mg other days       No current facility-administered medications on file prior to visit.   No Known Allergies History   Social History  . Marital Status: Widowed    Spouse Name: N/A    Number of Children: N/A  . Years of Education: N/A   Occupational History  . retired    Social History Main Topics  . Smoking status: Former Smoker -- 0.25 packs/day for 10 years    Types: Cigarettes    Quit date: 05/21/1971  . Smokeless tobacco: Never Used     Comment: quit in the 60's  . Alcohol Use: No  . Drug Use: No  . Sexual Activity: Not Currently   Other Topics Concern  . Not on file   Social  History Narrative  . No narrative on file     Review of Systems  All other systems reviewed and are negative.       Objective:   Physical Exam  Vitals reviewed. Neck: Neck supple.  Cardiovascular: Normal rate, regular rhythm and normal heart sounds.  Exam reveals no gallop and no friction rub.   No murmur heard. Pulmonary/Chest: Effort normal and breath sounds normal. No respiratory distress. She has no wheezes. She has no rales. She exhibits no tenderness.  Abdominal: Soft. Bowel sounds are normal. She exhibits no distension. There is no tenderness. There is no rebound and no guarding.  Musculoskeletal: She exhibits no edema.  Lymphadenopathy:    She has no cervical adenopathy.          Assessment & Plan:    1. A-fib Is therapeutic today. Continue her current dose of Coumadin. Recheck INR in 4-6 weeks. - PT with INR/Fingerstick

## 2013-06-29 ENCOUNTER — Other Ambulatory Visit: Payer: Self-pay | Admitting: Family Medicine

## 2013-06-29 NOTE — Telephone Encounter (Signed)
Refill appropriate and filled per protocol. 

## 2013-07-10 ENCOUNTER — Other Ambulatory Visit: Payer: Self-pay | Admitting: Family Medicine

## 2013-07-10 NOTE — Telephone Encounter (Signed)
Medication refilled per protocol. 

## 2013-07-17 ENCOUNTER — Other Ambulatory Visit: Payer: Self-pay | Admitting: *Deleted

## 2013-07-17 DIAGNOSIS — C50919 Malignant neoplasm of unspecified site of unspecified female breast: Secondary | ICD-10-CM

## 2013-07-17 MED ORDER — LETROZOLE 2.5 MG PO TABS
2.5000 mg | ORAL_TABLET | Freq: Every day | ORAL | Status: DC
Start: 2013-07-17 — End: 2013-07-19

## 2013-07-19 ENCOUNTER — Other Ambulatory Visit: Payer: Self-pay | Admitting: *Deleted

## 2013-07-19 DIAGNOSIS — C50919 Malignant neoplasm of unspecified site of unspecified female breast: Secondary | ICD-10-CM

## 2013-07-19 MED ORDER — LETROZOLE 2.5 MG PO TABS: 2.5000 mg | ORAL_TABLET | Freq: Every day | ORAL | Status: DC

## 2013-07-20 ENCOUNTER — Other Ambulatory Visit: Payer: Self-pay | Admitting: *Deleted

## 2013-07-20 DIAGNOSIS — C50919 Malignant neoplasm of unspecified site of unspecified female breast: Secondary | ICD-10-CM

## 2013-07-20 MED ORDER — LETROZOLE 2.5 MG PO TABS
2.5000 mg | ORAL_TABLET | Freq: Every day | ORAL | Status: DC
Start: 2013-07-20 — End: 2013-11-30

## 2013-07-31 ENCOUNTER — Other Ambulatory Visit: Payer: Self-pay | Admitting: Family Medicine

## 2013-07-31 ENCOUNTER — Ambulatory Visit (INDEPENDENT_AMBULATORY_CARE_PROVIDER_SITE_OTHER): Payer: Medicare Other | Admitting: Family Medicine

## 2013-07-31 ENCOUNTER — Encounter: Payer: Self-pay | Admitting: Family Medicine

## 2013-07-31 VITALS — BP 110/64 | HR 64 | Temp 98.1°F | Resp 18 | Ht 60.0 in | Wt 185.0 lb

## 2013-07-31 DIAGNOSIS — I4891 Unspecified atrial fibrillation: Secondary | ICD-10-CM

## 2013-07-31 LAB — PT WITH INR/FINGERSTICK
INR, fingerstick: 2.6 — ABNORMAL HIGH (ref 0.80–1.20)
PT, fingerstick: 30.7 seconds — ABNORMAL HIGH (ref 10.4–12.5)

## 2013-07-31 NOTE — Progress Notes (Signed)
Subjective:    Patient ID: Kathy [name], female    DOB: Oct 26, 1927, 80 y.o.   MRN: [id number]  HPI  04/13/13 Patient is here for a followup INR check. She is currently taking Coumadin for atrial fibrillation. Her heart rate is well controlled on metoprolol 25 mg by mouth twice a day. She denies any bleeding or bruising. She denies any change in her diet. Her INR is therapeutic today at 2.3. Unfortunately her husband recently passed away after a prolonged illness. The patient seems to be tolerating this as well as to be expected.  At that time, my plan was: 1. A-fib Continue Coumadin at its present dose. Recheck INR in 6 weeks. The patient also received Prevnar 13 today while she was in the office. - PT with INR/Fingerstick  05/25/13 She presents today for a recheck. Her INR is subtherapeutic at 1.7. However earlier this week she missed a dose of the Coumadin after she fell and was bruising substantially around the left eye. She denies any change in medication or her diet.  At that time, my plan was: Atrial fibrillation  A-fib - Plan: PT with INR/Fingerstick  Take 5 mg of Coumadin today then resume her previous schedule of 2.5 mg on Tuesday Thursday and Saturday and 5 mg on all other days. Recheck INR in 4 weeks. She has previously been well controlled at this dose and I hesitate to make changes now particularly her having missed a dose earlier this week.  06/26/13 She is here today followup. She is currently taking 5 mg of Coumadin everyday except Tuesday Thursday and Saturdays. On those days she takes 2.5 mg of Coumadin. She denies any bleeding or bruising.  At that time, my plan was: 1. A-fib Is therapeutic today. Continue her current dose of Coumadin. Recheck INR in 4-6 weeks. - PT with INR/Fingerstick   07/31/13 Patient is here today for followup. Her INR today is 2.6 and therapeutic. She is currently taking 5 mg Coumadin every day except Tuesday and Thursday and Saturday. On those  days she takes 2.5 mg. She denies any bleeding or bruising. Past Medical History  Diagnosis Date  . Hiatal hernia   . Diverticulitis   . PONV (postoperative nausea and vomiting)   . Shortness of breath     with exertion  . Arthritis     both shoulders  . Joint pain     both shoulders  . Hemorrhoids     internal  . Diverticulosis   . Urinary incontinence   . Nocturia   . Urinary frequency   . Sinus drainage   . Cataracts, bilateral   . Cancer     left breast  . Nasal congestion   . Breast cancer   . Hyperlipidemia     takes Zocor daily  . Hypertension     takes Amlodipine daily  . Obesity   . Macular degeneration 02/2003  . Osteoporosis   . Atrial fibrillation    Current Outpatient Prescriptions on File Prior to Visit  Medication Sig Dispense Refill  . Calcium Carbonate-Vitamin D (CALTRATE 600+D) 600-400 MG-UNIT per tablet Take 1 tablet by mouth 2 (two) times daily.        . cholecalciferol (VITAMIN D) 400 UNITS TABS Take 400 Units by mouth daily. Take in addition to Caltrate with Vitamin D to get 1200 units of Vitamin D      . digoxin (LANOXIN) 0.125 MG tablet Take 0.125 mg by mouth daily. None on Sat or Sun      .  letrozole (FEMARA) 2.5 MG tablet Take 1 tablet (2.5 mg total) by mouth daily.  90 tablet  3  . magnesium gluconate (MAGONATE) 500 MG tablet Take 500 mg by mouth 2 (two) times daily.      . metoprolol tartrate (LOPRESSOR) 25 MG tablet Take 50 mg by mouth 2 (two) times daily.      . valsartan-hydrochlorothiazide (DIOVAN-HCT) 320-25 MG per tablet TAKE ONE TABLET BY MOUTH ONCE DAILY  30 tablet  2  . warfarin (COUMADIN) 5 MG tablet 2.5 mg Tues/Thurs/Sat - and 5 mg other days       No current facility-administered medications on file prior to visit.   No Known Allergies History   Social History  . Marital Status: Widowed    Spouse Name: N/A    Number of Children: N/A  . Years of Education: N/A   Occupational History  . retired    Social History Main  Topics  . Smoking status: Former Smoker -- 0.25 packs/day for 10 years    Types: Cigarettes    Quit date: 05/21/1971  . Smokeless tobacco: Never Used     Comment: quit in the 60's  . Alcohol Use: No  . Drug Use: No  . Sexual Activity: Not Currently   Other Topics Concern  . Not on file   Social History Narrative  . No narrative on file     Review of Systems  All other systems reviewed and are negative.       Objective:   Physical Exam  Vitals reviewed. Neck: Neck supple.  Cardiovascular: Normal rate, regular rhythm and normal heart sounds.  Exam reveals no gallop and no friction rub.   No murmur heard. Pulmonary/Chest: Effort normal and breath sounds normal. No respiratory distress. She has no wheezes. She has no rales. She exhibits no tenderness.  Abdominal: Soft. Bowel sounds are normal. She exhibits no distension. There is no tenderness. There is no rebound and no guarding.  Musculoskeletal: She exhibits no edema.  Lymphadenopathy:    She has no cervical adenopathy.          Assessment & Plan:  1. A-fib  INR is therapeutic today. Continue her current dose of Coumadin. Recheck INR in 6 weeks. - PT with INR/Fingerstick  - PT with INR/Fingerstick

## 2013-08-10 ENCOUNTER — Encounter: Payer: Self-pay | Admitting: Family Medicine

## 2013-09-04 ENCOUNTER — Encounter: Payer: Self-pay | Admitting: Family Medicine

## 2013-09-04 ENCOUNTER — Ambulatory Visit (INDEPENDENT_AMBULATORY_CARE_PROVIDER_SITE_OTHER): Payer: Medicare Other | Admitting: Family Medicine

## 2013-09-04 VITALS — BP 110/64 | HR 60 | Temp 97.8°F | Resp 16 | Ht 60.0 in | Wt 184.0 lb

## 2013-09-04 DIAGNOSIS — E785 Hyperlipidemia, unspecified: Secondary | ICD-10-CM

## 2013-09-04 DIAGNOSIS — I4891 Unspecified atrial fibrillation: Secondary | ICD-10-CM | POA: Diagnosis not present

## 2013-09-04 LAB — CBC WITH DIFFERENTIAL/PLATELET
BASOS ABS: 0.1 10*3/uL (ref 0.0–0.1)
Basophils Relative: 1 % (ref 0–1)
Eosinophils Absolute: 0.2 10*3/uL (ref 0.0–0.7)
Eosinophils Relative: 3 % (ref 0–5)
HCT: 42.2 % (ref 36.0–46.0)
Hemoglobin: 14 g/dL (ref 12.0–15.0)
LYMPHS ABS: 2.1 10*3/uL (ref 0.7–4.0)
LYMPHS PCT: 35 % (ref 12–46)
MCH: 31.7 pg (ref 26.0–34.0)
MCHC: 33.2 g/dL (ref 30.0–36.0)
MCV: 95.7 fL (ref 78.0–100.0)
Monocytes Absolute: 0.5 10*3/uL (ref 0.1–1.0)
Monocytes Relative: 9 % (ref 3–12)
NEUTROS ABS: 3.2 10*3/uL (ref 1.7–7.7)
NEUTROS PCT: 52 % (ref 43–77)
PLATELETS: 244 10*3/uL (ref 150–400)
RBC: 4.41 MIL/uL (ref 3.87–5.11)
RDW: 15 % (ref 11.5–15.5)
WBC: 6.1 10*3/uL (ref 4.0–10.5)

## 2013-09-04 LAB — COMPLETE METABOLIC PANEL WITH GFR
ALT: 9 U/L (ref 0–35)
AST: 15 U/L (ref 0–37)
Albumin: 4.1 g/dL (ref 3.5–5.2)
Alkaline Phosphatase: 65 U/L (ref 39–117)
BUN: 22 mg/dL (ref 6–23)
CALCIUM: 9.5 mg/dL (ref 8.4–10.5)
CHLORIDE: 100 meq/L (ref 96–112)
CO2: 29 meq/L (ref 19–32)
Creat: 1 mg/dL (ref 0.50–1.10)
GFR, EST AFRICAN AMERICAN: 60 mL/min
GFR, Est Non African American: 52 mL/min — ABNORMAL LOW
Glucose, Bld: 111 mg/dL — ABNORMAL HIGH (ref 70–99)
POTASSIUM: 4.3 meq/L (ref 3.5–5.3)
SODIUM: 139 meq/L (ref 135–145)
TOTAL PROTEIN: 6.6 g/dL (ref 6.0–8.3)
Total Bilirubin: 0.8 mg/dL (ref 0.2–1.2)

## 2013-09-04 LAB — LIPID PANEL
Cholesterol: 109 mg/dL (ref 0–200)
HDL: 36 mg/dL — ABNORMAL LOW (ref >39)
LDL CALC: 50 mg/dL (ref 0–99)
TRIGLYCERIDES: 115 mg/dL (ref <150)
Total CHOL/HDL Ratio: 3 Ratio
VLDL: 23 mg/dL (ref 0–40)

## 2013-09-04 LAB — PT WITH INR/FINGERSTICK
INR FINGERSTICK: 2.2 — AB (ref 0.80–1.20)
PT, fingerstick: 25.9 seconds — ABNORMAL HIGH (ref 10.4–12.5)

## 2013-09-04 NOTE — Progress Notes (Signed)
Subjective:    Patient ID: Kathy [name], female    DOB: 03-13-1928, 80 y.o.   MRN: [id number]  HPI Patient is here today to recheck her blood pressure her cholesterol and her atrial fibrillation. Currently her heart rate is controlled on digoxin and metoprolol.  Her Coumadin is therapeutic with an INR of 2.2.  She is currently taking 2.5 mg on Tuesday, Thursday, Saturday and 5 mg on every other day. She denies any bleeding or bruising. She is also taking simvastatin 20 mg by mouth daily for hyperlipidemia. She denies any myalgias or right quadrant pain. Her blood pressure is adequately controlled on her medications. She is overdue for fasting lab work. Past Medical History  Diagnosis Date  . Hiatal hernia   . Diverticulitis   . PONV (postoperative nausea and vomiting)   . Shortness of breath     with exertion  . Arthritis     both shoulders  . Joint pain     both shoulders  . Hemorrhoids     internal  . Diverticulosis   . Urinary incontinence   . Nocturia   . Urinary frequency   . Sinus drainage   . Cataracts, bilateral   . Cancer     left breast  . Nasal congestion   . Breast cancer   . Hyperlipidemia     takes Zocor daily  . Hypertension     takes Amlodipine daily  . Obesity   . Macular degeneration 02/2003  . Osteoporosis   . Atrial fibrillation    Current Outpatient Prescriptions on File Prior to Visit  Medication Sig Dispense Refill  . Calcium Carbonate-Vitamin D (CALTRATE 600+D) 600-400 MG-UNIT per tablet Take 1 tablet by mouth 2 (two) times daily.        . cholecalciferol (VITAMIN D) 400 UNITS TABS Take 400 Units by mouth daily. Take in addition to Caltrate with Vitamin D to get 1200 units of Vitamin D      . digoxin (LANOXIN) 0.125 MG tablet Take 0.125 mg by mouth daily. None on Sat or Sun      . letrozole (FEMARA) 2.5 MG tablet Take 1 tablet (2.5 mg total) by mouth daily.  90 tablet  3  . magnesium gluconate (MAGONATE) 500 MG tablet Take 500 mg by mouth 2  (two) times daily.      . metoprolol tartrate (LOPRESSOR) 25 MG tablet Take 50 mg by mouth 2 (two) times daily.      . simvastatin (ZOCOR) 20 MG tablet TAKE ONE TABLET BY MOUTH AT BEDTIME  30 tablet  11  . valsartan-hydrochlorothiazide (DIOVAN-HCT) 320-25 MG per tablet TAKE ONE TABLET BY MOUTH ONCE DAILY  30 tablet  2  . warfarin (COUMADIN) 5 MG tablet 2.5 mg Tues/Thurs/Sat - and 5 mg other days       No current facility-administered medications on file prior to visit.   No Known Allergies History   Social History  . Marital Status: Widowed    Spouse Name: N/A    Number of Children: N/A  . Years of Education: N/A   Occupational History  . retired    Social History Main Topics  . Smoking status: Former Smoker -- 0.25 packs/day for 10 years    Types: Cigarettes    Quit date: 05/21/1971  . Smokeless tobacco: Never Used     Comment: quit in the 60's  . Alcohol Use: No  . Drug Use: No  . Sexual Activity: Not Currently   Other Topics  Concern  . Not on file   Social History Narrative  . No narrative on file      Review of Systems  All other systems reviewed and are negative.      Objective:   Physical Exam  Vitals reviewed. Neck: Neck supple. No JVD present. No thyromegaly present.  Cardiovascular: Normal rate and normal heart sounds.   No murmur heard. Pulmonary/Chest: Effort normal and breath sounds normal. No respiratory distress. She has no wheezes. She has no rales.  Abdominal: Soft. Bowel sounds are normal. She exhibits no distension and no mass. There is no tenderness. There is no rebound and no guarding.  Musculoskeletal: She exhibits no edema.  Lymphadenopathy:    She has no cervical adenopathy.          Assessment & Plan:  1. A-fib  Continue Coumadin at his present dose. The heart rate is controlled. Blood pressure is excellent. Followup in 6 weeks - PT with INR/Fingerstick - CBC with Differential  2. HLD (hyperlipidemia) Check fasting lipid  panel. Go LDL is less than 130. - COMPLETE METABOLIC PANEL WITH GFR - Lipid panel

## 2013-09-26 DIAGNOSIS — N183 Chronic kidney disease, stage 3 unspecified: Secondary | ICD-10-CM | POA: Diagnosis not present

## 2013-09-26 DIAGNOSIS — I10 Essential (primary) hypertension: Secondary | ICD-10-CM | POA: Diagnosis not present

## 2013-09-26 DIAGNOSIS — R0602 Shortness of breath: Secondary | ICD-10-CM | POA: Diagnosis not present

## 2013-09-26 DIAGNOSIS — I4891 Unspecified atrial fibrillation: Secondary | ICD-10-CM | POA: Diagnosis not present

## 2013-10-09 ENCOUNTER — Other Ambulatory Visit: Payer: Self-pay | Admitting: Family Medicine

## 2013-10-17 ENCOUNTER — Encounter: Payer: Self-pay | Admitting: Family Medicine

## 2013-10-17 ENCOUNTER — Ambulatory Visit (INDEPENDENT_AMBULATORY_CARE_PROVIDER_SITE_OTHER): Payer: Medicare Other | Admitting: Family Medicine

## 2013-10-17 VITALS — BP 102/60 | HR 58 | Temp 98.3°F | Resp 14 | Ht 60.0 in | Wt 187.0 lb

## 2013-10-17 DIAGNOSIS — I4891 Unspecified atrial fibrillation: Secondary | ICD-10-CM

## 2013-10-17 DIAGNOSIS — I482 Chronic atrial fibrillation, unspecified: Secondary | ICD-10-CM

## 2013-10-17 LAB — PT WITH INR/FINGERSTICK
INR, fingerstick: 2.7 — ABNORMAL HIGH (ref 0.80–1.20)
PT FINGERSTICK: 31.8 s — AB (ref 10.4–12.5)

## 2013-10-17 NOTE — Progress Notes (Signed)
Subjective:    Patient ID: Kathy [name], female    DOB: [birth year], 80 y.o.   MRN: [id number]  HPI 09/04/13 Patient is here today to recheck her blood pressure her cholesterol and her atrial fibrillation. Currently her heart rate is controlled on digoxin and metoprolol.  Her Coumadin is therapeutic with an INR of 2.2.  She is currently taking 2.5 mg on Tuesday, Thursday, Saturday and 5 mg on every other day. She denies any bleeding or bruising. She is also taking simvastatin 20 mg by mouth daily for hyperlipidemia. She denies any myalgias or right quadrant pain. Her blood pressure is adequately controlled on her medications. She is overdue for fasting lab work.   At that time, my plan was: 1. A-fib  Continue Coumadin at his present dose. The heart rate is controlled. Blood pressure is excellent. Followup in 6 weeks - PT with INR/Fingerstick - CBC with Differential  2. HLD (hyperlipidemia) Check fasting lipid panel. Go LDL is less than 130. - COMPLETE METABOLIC PANEL WITH GFR - Lipid panel  10/17/13 Lab work was excellent.  Patient is doing well. She denies any bleeding or bruising. Her INR today in clinic is therapeutic at 2.7. She's currently taking 2.5 mg of Coumadin on Tuesday Thursday and Saturday and 5 mg on every other day. Her cholesterol is xcellent on the most recent lab work. Her fasting blood sugar was slightly elevated a 111. The patient missed his she's been eating high carbohydrate diet including a lo of bread. Past Medical History  Diagnosis Date  . Hiatal hernia   . Diverticulitis   . PONV (postoperative nausea and vomiting)   . Shortness of breath     with exertion  . Arthritis     both shoulders  . Joint pain     both shoulders  . Hemorrhoids     internal  . Diverticulosis   . Urinary incontinence   . Nocturia   . Urinary frequency   . Sinus drainage   . Cataracts, bilateral   . Cancer     left breast  . Nasal congestion   . Breast cancer   .  Hyperlipidemia     takes Zocor daily  . Hypertension     takes Amlodipine daily  . Obesity   . Macular degeneration 02/2003  . Osteoporosis   . Atrial fibrillation    Current Outpatient Prescriptions on File Prior to Visit  Medication Sig Dispense Refill  . Calcium Carbonate-Vitamin D (CALTRATE 600+D) 600-400 MG-UNIT per tablet Take 1 tablet by mouth 2 (two) times daily.        . cholecalciferol (VITAMIN D) 400 UNITS TABS Take 400 Units by mouth daily. Take in addition to Caltrate with Vitamin D to get 1200 units of Vitamin D      . digoxin (LANOXIN) 0.125 MG tablet Take 0.125 mg by mouth daily. None on Sat or Sun      . letrozole (FEMARA) 2.5 MG tablet Take 1 tablet (2.5 mg total) by mouth daily.  90 tablet  3  . magnesium gluconate (MAGONATE) 500 MG tablet Take 500 mg by mouth 2 (two) times daily.      . metoprolol tartrate (LOPRESSOR) 25 MG tablet Take 50 mg by mouth 2 (two) times daily.      . simvastatin (ZOCOR) 20 MG tablet TAKE ONE TABLET BY MOUTH AT BEDTIME  30 tablet  11  . valsartan-hydrochlorothiazide (DIOVAN-HCT) 320-25 MG per tablet TAKE ONE TABLET BY MOUTH ONCE DAILY  30 tablet  11  . warfarin (COUMADIN) 5 MG tablet 2.5 mg Tues/Thurs/Sat - and 5 mg other days       No current facility-administered medications on file prior to visit.   No Known Allergies History   Social History  . Marital Status: Widowed    Spouse Name: N/A    Number of Children: N/A  . Years of Education: N/A   Occupational History  . retired    Social History Main Topics  . Smoking status: Former Smoker -- 0.25 packs/day for 10 years    Types: Cigarettes    Quit date: 05/21/1971  . Smokeless tobacco: Never Used     Comment: quit in the 60's  . Alcohol Use: No  . Drug Use: No  . Sexual Activity: Not Currently   Other Topics Concern  . Not on file   Social History Narrative  . No narrative on file      Review of Systems  All other systems reviewed and are negative.        Objective:   Physical Exam  Vitals reviewed. Neck: Neck supple. No JVD present. No thyromegaly present.  Cardiovascular: Normal rate and normal heart sounds.   No murmur heard. Pulmonary/Chest: Effort normal and breath sounds normal. No respiratory distress. She has no wheezes. She has no rales.  Abdominal: Soft. Bowel sounds are normal. She exhibits no distension and no mass. There is no tenderness. There is no rebound and no guarding.  Musculoskeletal: She exhibits no edema.  Lymphadenopathy:    She has no cervical adenopathy.          Assessment & Plan:  1. A-fib Coumadin is therapeutic. Continue the current dose. Recheck INR in 6 weeks. We also had a long discussion about low carbohydrate diet to help prevent progression of her prediabetes. - PT with INR/Fingerstick

## 2013-10-26 ENCOUNTER — Other Ambulatory Visit: Payer: Self-pay | Admitting: Family Medicine

## 2013-10-26 DIAGNOSIS — R0602 Shortness of breath: Secondary | ICD-10-CM | POA: Diagnosis not present

## 2013-10-26 DIAGNOSIS — I4891 Unspecified atrial fibrillation: Secondary | ICD-10-CM | POA: Diagnosis not present

## 2013-11-21 ENCOUNTER — Encounter: Payer: Self-pay | Admitting: Family Medicine

## 2013-11-22 ENCOUNTER — Other Ambulatory Visit: Payer: Self-pay

## 2013-11-22 DIAGNOSIS — C50919 Malignant neoplasm of unspecified site of unspecified female breast: Secondary | ICD-10-CM

## 2013-11-23 ENCOUNTER — Other Ambulatory Visit (HOSPITAL_BASED_OUTPATIENT_CLINIC_OR_DEPARTMENT_OTHER): Payer: Medicare Other

## 2013-11-23 DIAGNOSIS — C50519 Malignant neoplasm of lower-outer quadrant of unspecified female breast: Secondary | ICD-10-CM | POA: Diagnosis not present

## 2013-11-23 DIAGNOSIS — C50919 Malignant neoplasm of unspecified site of unspecified female breast: Secondary | ICD-10-CM

## 2013-11-23 LAB — COMPREHENSIVE METABOLIC PANEL (CC13)
ALBUMIN: 3.6 g/dL (ref 3.5–5.0)
ALK PHOS: 59 U/L (ref 40–150)
ALT: 9 U/L (ref 0–55)
AST: 15 U/L (ref 5–34)
Anion Gap: 9 mEq/L (ref 3–11)
BILIRUBIN TOTAL: 0.71 mg/dL (ref 0.20–1.20)
BUN: 20.1 mg/dL (ref 7.0–26.0)
CO2: 27 mEq/L (ref 22–29)
Calcium: 9.8 mg/dL (ref 8.4–10.4)
Chloride: 104 mEq/L (ref 98–109)
Creatinine: 0.9 mg/dL (ref 0.6–1.1)
Glucose: 88 mg/dl (ref 70–140)
POTASSIUM: 4.3 meq/L (ref 3.5–5.1)
SODIUM: 140 meq/L (ref 136–145)
TOTAL PROTEIN: 6.8 g/dL (ref 6.4–8.3)

## 2013-11-23 LAB — CBC WITH DIFFERENTIAL/PLATELET
BASO%: 1.1 % (ref 0.0–2.0)
Basophils Absolute: 0.1 10*3/uL (ref 0.0–0.1)
EOS ABS: 0.2 10*3/uL (ref 0.0–0.5)
EOS%: 1.9 % (ref 0.0–7.0)
HCT: 43 % (ref 34.8–46.6)
HGB: 13.8 g/dL (ref 11.6–15.9)
LYMPH%: 39.2 % (ref 14.0–49.7)
MCH: 31.3 pg (ref 25.1–34.0)
MCHC: 32.1 g/dL (ref 31.5–36.0)
MCV: 97.4 fL (ref 79.5–101.0)
MONO#: 0.9 10*3/uL (ref 0.1–0.9)
MONO%: 10.6 % (ref 0.0–14.0)
NEUT%: 47.2 % (ref 38.4–76.8)
NEUTROS ABS: 3.9 10*3/uL (ref 1.5–6.5)
PLATELETS: 252 10*3/uL (ref 145–400)
RBC: 4.41 10*6/uL (ref 3.70–5.45)
RDW: 14.1 % (ref 11.2–14.5)
WBC: 8.3 10*3/uL (ref 3.9–10.3)
lymph#: 3.2 10*3/uL (ref 0.9–3.3)

## 2013-11-27 ENCOUNTER — Encounter (INDEPENDENT_AMBULATORY_CARE_PROVIDER_SITE_OTHER): Payer: Self-pay | Admitting: General Surgery

## 2013-11-27 ENCOUNTER — Ambulatory Visit (INDEPENDENT_AMBULATORY_CARE_PROVIDER_SITE_OTHER): Payer: Medicare Other | Admitting: General Surgery

## 2013-11-27 VITALS — BP 136/74 | HR 80 | Resp 18 | Ht 60.0 in | Wt 187.0 lb

## 2013-11-27 DIAGNOSIS — C50912 Malignant neoplasm of unspecified site of left female breast: Secondary | ICD-10-CM

## 2013-11-27 DIAGNOSIS — C50919 Malignant neoplasm of unspecified site of unspecified female breast: Secondary | ICD-10-CM | POA: Diagnosis not present

## 2013-11-27 NOTE — Patient Instructions (Signed)
Continue femara Continue regular self exams

## 2013-11-27 NOTE — Progress Notes (Signed)
Subjective:     Patient ID: Kathy [name], female   DOB: 19-Aug-1927, 80 y.o.   MRN: [id number]  HPI The patient is an 81 year old white female who Is 2-1/2 years status post left breast lumpectomy for a T1 CNX left breast cancer. She was ER PR positive and HER-2/neu negative with a Ki-67 of 10%. She has no complaints today. She denies any breast pain. She denies any discharge from her nipple. She has had no major medical problems in the last 6 months. Her next mammogram is due in December  Review of Systems  Constitutional: Negative.   HENT: Negative.   Eyes: Negative.   Respiratory: Negative.   Cardiovascular: Negative.   Gastrointestinal: Negative.   Endocrine: Negative.   Genitourinary: Negative.   Musculoskeletal: Negative.   Skin: Negative.   Allergic/Immunologic: Negative.   Neurological: Negative.   Hematological: Negative.   Psychiatric/Behavioral: Negative.        Objective:   Physical Exam  Constitutional: She is oriented to person, place, and time. She appears well-developed and well-nourished.  HENT:  Head: Normocephalic and atraumatic.  Eyes: Conjunctivae and EOM are normal. Pupils are equal, round, and reactive to light.  Neck: Normal range of motion. Neck supple.  Cardiovascular: Normal rate, regular rhythm and normal heart sounds.   Pulmonary/Chest: Effort normal and breath sounds normal.  There is a mild palpable firmness at her old lumpectomy incision. Otherwise there is no palpable mass in either breast. There is no palpable axillary, supraclavicular, or cervical lymphadenopathy.  Abdominal: Soft. Bowel sounds are normal.  Musculoskeletal: Normal range of motion.  Lymphadenopathy:    She has no cervical adenopathy.  Neurological: She is alert and oriented to person, place, and time.  Skin: Skin is warm and dry.  Psychiatric: She has a normal mood and affect. Her behavior is normal.       Assessment:     The patient is 2-1/2 years status post left  breast lumpectomy for breast cancer     Plan:     At this point she will continue to take Femara daily. She will continue to do regular self exams. I will plan to see her back in 6 months.

## 2013-11-30 ENCOUNTER — Telehealth: Payer: Self-pay | Admitting: Oncology

## 2013-11-30 ENCOUNTER — Ambulatory Visit (HOSPITAL_BASED_OUTPATIENT_CLINIC_OR_DEPARTMENT_OTHER): Payer: Medicare Other | Admitting: Oncology

## 2013-11-30 VITALS — BP 108/61 | HR 54 | Temp 98.2°F | Resp 18 | Ht 60.0 in | Wt 189.1 lb

## 2013-11-30 DIAGNOSIS — Z170 Estrogen receptor positive status [ER+]: Secondary | ICD-10-CM

## 2013-11-30 DIAGNOSIS — M899 Disorder of bone, unspecified: Secondary | ICD-10-CM | POA: Diagnosis not present

## 2013-11-30 DIAGNOSIS — C50519 Malignant neoplasm of lower-outer quadrant of unspecified female breast: Secondary | ICD-10-CM

## 2013-11-30 DIAGNOSIS — M949 Disorder of cartilage, unspecified: Secondary | ICD-10-CM | POA: Diagnosis not present

## 2013-11-30 DIAGNOSIS — C50919 Malignant neoplasm of unspecified site of unspecified female breast: Secondary | ICD-10-CM

## 2013-11-30 MED ORDER — LETROZOLE 2.5 MG PO TABS: 2.5000 mg | ORAL_TABLET | Freq: Every day | ORAL | Status: DC

## 2013-11-30 NOTE — Progress Notes (Signed)
ID: Kathy [name]   DOB: January 29, 1928  MR#: [id number]  WUJ#:[id number]   PCP:  Odette Fraction, MD GYN: SU:  Madilyn Hook, MD Other:    CHIEF COMPLAINT: Early stage estrogen receptor positive breast cancer  CURRENT TREATMENT: Tamoxifen  BREAST CANCER HISTORY:: From the original intake note: The patient is an 80 year old [place] woman who had routine yearly screening mammography at [facility] 03/23/2011. A possible area of distortion was noted in the upper outer quadrant of the left breast and she was recalled for additional views November 29. There was a stellate mass in the outer lower quadrant of the left breast which appears more prominent. By ultrasound there was a suggestion of a 1 cm mass at the site of the scar.   BSGI was performed 03/31/2011 1 showed a focal intense isotopic area of activity measuring 1.6 cm at the site in question. Biopsy of this area was performed 04/07/2011 and showed [phone]) and invasive ductal carcinoma, grade 2, which was estrogen receptor positive at 100%, progesterone receptor positive at 69%, with an MIB-1-1 of 10%, and no HER-2 amplification.   The patient had her left lumpectomy, 05/06/2011. This showed multiple positive margins, so on 05/28/2011 she went back of to the operating table for margin clearance. This was successfully accomplished.   Her subsequent history is as detailed below.  INTERVAL HISTORY: Kathy [name] returns today for followup of her left breast carcinoma. Since her last visit here her husband, SL, passed away. "He had many medical problems". Normal lives by herself and does all her house chores activities, shopping, driving, and cooking. "If I need help I does need to make a phone call". She continues on letrozole which now she gets for $24.32  (three-month supply). She has an occasional hot flash as her only side effect.  REVIEW OF SYSTEMS: Kathy [name] thought her hair was thinning earlier but now is getting thicker again. She does have  muscle cramps at times. She keeps a runny nose and a little bit of a dry cough. She has an irregular heartbeat and is on warfarin for that. She can be short of breath when walking up stairs. She has stress urinary incontinence. She has her aches and pains here in there but these are not more intense or persistent than before. A detailed review of systems was otherwise noncontributory   PAST MEDICAL HISTORY: Past Medical History  Diagnosis Date  . Hiatal hernia   . Diverticulitis   . PONV (postoperative nausea and vomiting)   . Shortness of breath     with exertion  . Arthritis     both shoulders  . Joint pain     both shoulders  . Hemorrhoids     internal  . Diverticulosis   . Urinary incontinence   . Nocturia   . Urinary frequency   . Sinus drainage   . Cataracts, bilateral   . Cancer     left breast  . Nasal congestion   . Breast cancer   . Hyperlipidemia     takes Zocor daily  . Hypertension     takes Amlodipine daily  . Obesity   . Macular degeneration 02/2003  . Osteoporosis   . Atrial fibrillation     PAST SURGICAL HISTORY: Past Surgical History  Procedure Laterality Date  . Left breast biopsy  1984  . Right breast biopsy  1993  . Bilateral knee replacements  2008  . Hemorrhoid surgery      outpatient "done in doctor's office"  . Colonoscopy    .  Esophagogastroduodenoscopy    . Breast biopsy  05/04/2011    Procedure: BREAST BIOPSY WITH NEEDLE LOCALIZATION;  Surgeon: Judieth Keens, DO;  Location: [facility];  Service: General;  Laterality: Left;  . Joint replacement  2008    bilateral knee replacement  . Cardioversion N/A 09/15/2012    Procedure: CARDIOVERSION;  Surgeon: Laverda Page, MD;  [facility] ENDOSCOPY;  Service: Cardiovascular;  Laterality: N/A;  h&p in file-[facility]    FAMILY HISTORY Family History  Problem Relation Age of Onset  . Cancer Mother     pancreatic  . Cancer Father     esophageal  . Cancer Sister     ovarian  . Cancer Sister      lung  . Cancer Brother     kidney  . Cancer Sister     breast cancer  . Anesthesia problems Neg Hx   . Hypotension Neg Hx   . Malignant hyperthermia Neg Hx   . Pseudochol deficiency Neg Hx   . Heart disease Brother   . COPD Brother   . Cancer Brother     Kidney/Nephrectomy  The patient's father died from stomach or esophageal cancer at the age of 65. The patient's mother died from pancreas cancer at the age of 76 the patient has 52 sisters and 4 brothers. One brother died from kidney cancer and the other 3 from heart disease. One sister died of ovarian cancer which was diagnosed in her 86s. One sister died from "female organ cancer" diagnosed in her 20s. One sister died from lung cancer. The other surviving sister, currently 52, was just diagnosed with breast cancer.    Gynecologic history: GX P0. Menarche age 45, menopause in 73. She took hormone replacement approximately 8 years, stopping about 10 years ago. She had no complications from that treatment.   Social history: She used to work in a [employer], but is now retired. Her husband "S. L." used to work in a Writer. He died in 03/16/2013. The patient lives alone. She drives, does all her cooking and a house chores, and is normally active for an 80 year old. She does not exercise regularly.  ADVANCED DIRECTIVES: No advanced directives in place   HEALTH MAINTENANCE: History  Substance Use Topics  . Smoking status: Former Smoker -- 0.25 packs/day for 10 years    Types: Cigarettes    Quit date: 05/21/1971  . Smokeless tobacco: Never Used     Comment: quit in the 60's  . Alcohol Use: No   Colonoscopy: 2006  PAP: 2000  Bone density: 04/06/2013 at [facility] showed a T score of -1.1 (stable to improved) Cholesterol: controlled w. Medication.     No Known Allergies  Current Outpatient Prescriptions  Medication Sig Dispense Refill  . Calcium Carbonate-Vitamin D (CALTRATE 600+D) 600-400 MG-UNIT per tablet Take 1  tablet by mouth 2 (two) times daily.        . cholecalciferol (VITAMIN D) 400 UNITS TABS Take 400 Units by mouth daily. Take in addition to Caltrate with Vitamin D to get 1200 units of Vitamin D      . digoxin (LANOXIN) 0.125 MG tablet Take 0.125 mg by mouth daily. None on Sat or Sun      . letrozole (FEMARA) 2.5 MG tablet Take 1 tablet (2.5 mg total) by mouth daily.  90 tablet  3  . magnesium gluconate (MAGONATE) 500 MG tablet Take 500 mg by mouth 2 (two) times daily.      Marland Kitchen  metoprolol tartrate (LOPRESSOR) 25 MG tablet Take 50 mg by mouth 2 (two) times daily.      . simvastatin (ZOCOR) 20 MG tablet TAKE ONE TABLET BY MOUTH AT BEDTIME  30 tablet  11  . valsartan-hydrochlorothiazide (DIOVAN-HCT) 320-25 MG per tablet TAKE ONE TABLET BY MOUTH ONCE DAILY  30 tablet  11  . warfarin (COUMADIN) 5 MG tablet 2.5 mg Tues/Thurs/Sat - and 5 mg other days      . warfarin (COUMADIN) 5 MG tablet TAKE TWO TABLETS BY MOUTH ONCE DAILY  60 tablet  11   No current facility-administered medications for this visit.    OBJECTIVE: elderly white woman  who appears stated age  68 Vitals:   11/30/13 1329  BP: 108/61  Pulse: 54  Temp: 98.2 F (36.8 C)  Resp: 18     Body mass index is 36.93 kg/(m^2).    ECOG FS: 2 Filed Weights   11/30/13 1329  Weight: 189 lb 1.6 oz (85.775 kg)   Sclera not icteric, EOMs intact Oropharynx clear, most teeth gone ("only 3 are mine") No cervical or supraclavicular adenopathy Lungs clear to auscultation bilaterally, fair excursion Heart irregular rate and rhythm--as previously noted Abdomen soft, obese, nontender, positive bowel sounds Musculoskeletal  kyphosis but no focal spinal tenderness.  No upper extremity lymphedema. Neurologicnonfocal, well oriented, pleasant affect The right breast is unremarkable. The left breast is status post lumpectomy. There is no evidence of local recurrence. Both nipples are slightly retracted. Both axillae are benign   LAB RESULTS: Lab  Results  Component Value Date   WBC 8.3 11/23/2013   NEUTROABS 3.9 11/23/2013   HGB 13.8 11/23/2013   HCT 43.0 11/23/2013   MCV 97.4 11/23/2013   PLT 252 11/23/2013      Chemistry      Component Value Date/Time   NA 140 11/23/2013 1255   NA 139 09/04/2013 0915   K 4.3 11/23/2013 1255   K 4.3 09/04/2013 0915   CL 100 09/04/2013 0915   CL 102 05/06/2012 0959   CO2 27 11/23/2013 1255   CO2 29 09/04/2013 0915   BUN 20.1 11/23/2013 1255   BUN 22 09/04/2013 0915   CREATININE 0.9 11/23/2013 1255   CREATININE 1.00 09/04/2013 0915   CREATININE 0.82 09/15/2012 1048      Component Value Date/Time   CALCIUM 9.8 11/23/2013 1255   CALCIUM 9.5 09/04/2013 0915   ALKPHOS 59 11/23/2013 1255   ALKPHOS 65 09/04/2013 0915   AST 15 11/23/2013 1255   AST 15 09/04/2013 0915   ALT 9 11/23/2013 1255   ALT 9 09/04/2013 0915   BILITOT 0.71 11/23/2013 1255   BILITOT 0.8 09/04/2013 0915       STUDIES: Most recent bone density at [facility] 04/06/2013 showed osteopenia with a T score of -1.1  Most recent mammogram at [facility] on 04/06/2013 was unremarkable.   ASSESSMENT: 80 year old [place] woman   (1)  status post left lumpectomy with subsequent margin clearance January of 2013 for a T1c NX, (stage I) invasive ductal carcinoma, grade 2, strongly estrogen and progesterone receptor positive, with a low MIB-1, and no HER-2 amplification.   (2) on letrozole since February 2013. Dexa scan 04/06/2013 showed mild osteopenia (T -1.1)   PLAN: Santia is doing fine as far as her breast cancer is concerned and the plan will be to continue letrozole 2 February of 2017, at which time she will "graduate". If she sees her surgeon Dr. Marlou Starks next February, she will see me again  next August, and we will continue to "tag team her" every 6 months until she completes her followup 5 years.  Devona has a good understanding of the overall plan. She agrees with that. She appears to be adjusting well to living alone at now that her husband has  passed. She will call with any problems that may develop before next visit here.     MAGRINAT,GUSTAV C    11/30/2013

## 2013-11-30 NOTE — Telephone Encounter (Signed)
per pof to sch pt appts-gave pt copy of sch °

## 2013-12-01 ENCOUNTER — Ambulatory Visit (INDEPENDENT_AMBULATORY_CARE_PROVIDER_SITE_OTHER): Payer: Medicare Other | Admitting: Family Medicine

## 2013-12-01 ENCOUNTER — Encounter: Payer: Self-pay | Admitting: Family Medicine

## 2013-12-01 VITALS — BP 120/78 | HR 62 | Temp 98.5°F | Resp 18 | Wt 190.0 lb

## 2013-12-01 DIAGNOSIS — I482 Chronic atrial fibrillation, unspecified: Secondary | ICD-10-CM

## 2013-12-01 DIAGNOSIS — I4891 Unspecified atrial fibrillation: Secondary | ICD-10-CM | POA: Diagnosis not present

## 2013-12-01 LAB — PT WITH INR/FINGERSTICK
INR, fingerstick: 2.3 — ABNORMAL HIGH (ref 0.80–1.20)
PT, fingerstick: 28 seconds — ABNORMAL HIGH (ref 10.4–12.5)

## 2013-12-01 NOTE — Progress Notes (Signed)
Subjective:    Patient ID: Kathy [name], female    DOB: October 18, 1927, 80 y.o.   MRN: [id number]  HPI 09/04/13 Patient is here today to recheck her blood pressure her cholesterol and her atrial fibrillation. Currently her heart rate is controlled on digoxin and metoprolol.  Her Coumadin is therapeutic with an INR of 2.2.  She is currently taking 2.5 mg on Tuesday, Thursday, Saturday and 5 mg on every other day. She denies any bleeding or bruising. She is also taking simvastatin 20 mg by mouth daily for hyperlipidemia. She denies any myalgias or right quadrant pain. Her blood pressure is adequately controlled on her medications. She is overdue for fasting lab work.   At that time, my plan was: 1. A-fib  Continue Coumadin at his present dose. The heart rate is controlled. Blood pressure is excellent. Followup in 6 weeks - PT with INR/Fingerstick - CBC with Differential  2. HLD (hyperlipidemia) Check fasting lipid panel. Go LDL is less than 130. - COMPLETE METABOLIC PANEL WITH GFR - Lipid panel  10/17/13 Lab work was excellent.  Patient is doing well. She denies any bleeding or bruising. Her INR today in clinic is therapeutic at 2.7. She's currently taking 2.5 mg of Coumadin on Tuesday Thursday and Saturday and 5 mg on every other day. Her cholesterol is xcellent on the most recent lab work. Her fasting blood sugar was slightly elevated a 111. The patient missed his she's been eating high carbohydrate diet including a lot of bread.  At that time, my plan was: 1. A-fib Coumadin is therapeutic. Continue the current dose. Recheck INR in 6 weeks. We also had a long discussion about low carbohydrate diet to help prevent progression of her prediabetes. - PT with INR/Fingerstick  12/01/13 Patient is here today for Coumadin recheck. She continues to take 2.5 mg of Coumadin on Tuesday Thursday and Saturday and 5 mg on every other day.  INR is 2.3.  Past Medical History  Diagnosis Date  . Hiatal hernia     . Diverticulitis   . PONV (postoperative nausea and vomiting)   . Shortness of breath     with exertion  . Arthritis     both shoulders  . Joint pain     both shoulders  . Hemorrhoids     internal  . Diverticulosis   . Urinary incontinence   . Nocturia   . Urinary frequency   . Sinus drainage   . Cataracts, bilateral   . Cancer     left breast  . Nasal congestion   . Breast cancer   . Hyperlipidemia     takes Zocor daily  . Hypertension     takes Amlodipine daily  . Obesity   . Macular degeneration 02/2003  . Osteoporosis   . Atrial fibrillation    Current Outpatient Prescriptions on File Prior to Visit  Medication Sig Dispense Refill  . Calcium Carbonate-Vitamin D (CALTRATE 600+D) 600-400 MG-UNIT per tablet Take 1 tablet by mouth 2 (two) times daily.        . cholecalciferol (VITAMIN D) 400 UNITS TABS Take 400 Units by mouth daily. Take in addition to Caltrate with Vitamin D to get 1200 units of Vitamin D      . digoxin (LANOXIN) 0.125 MG tablet Take 0.125 mg by mouth daily. None on Sat or Sun      . letrozole (FEMARA) 2.5 MG tablet Take 1 tablet (2.5 mg total) by mouth daily.  90 tablet  3  .  magnesium gluconate (MAGONATE) 500 MG tablet Take 500 mg by mouth 2 (two) times daily.      . metoprolol tartrate (LOPRESSOR) 25 MG tablet Take 50 mg by mouth 2 (two) times daily.      . simvastatin (ZOCOR) 20 MG tablet TAKE ONE TABLET BY MOUTH AT BEDTIME  30 tablet  11  . valsartan-hydrochlorothiazide (DIOVAN-HCT) 320-25 MG per tablet TAKE ONE TABLET BY MOUTH ONCE DAILY  30 tablet  11  . warfarin (COUMADIN) 5 MG tablet 2.5 mg Tues/Thurs/Sat - and 5 mg other days      . warfarin (COUMADIN) 5 MG tablet TAKE TWO TABLETS BY MOUTH ONCE DAILY  60 tablet  11   No current facility-administered medications on file prior to visit.   No Known Allergies History   Social History  . Marital Status: Widowed    Spouse Name: N/A    Number of Children: N/A  . Years of Education: N/A    Occupational History  . retired    Social History Main Topics  . Smoking status: Former Smoker -- 0.25 packs/day for 10 years    Types: Cigarettes    Quit date: 05/21/1971  . Smokeless tobacco: Never Used     Comment: quit in the 60's  . Alcohol Use: No  . Drug Use: No  . Sexual Activity: Not Currently   Other Topics Concern  . Not on file   Social History Narrative  . No narrative on file      Review of Systems  All other systems reviewed and are negative.      Objective:   Physical Exam  Vitals reviewed. Neck: Neck supple. No JVD present. No thyromegaly present.  Cardiovascular: Normal rate and normal heart sounds.   No murmur heard. Pulmonary/Chest: Effort normal and breath sounds normal. No respiratory distress. She has no wheezes. She has no rales.  Abdominal: Soft. Bowel sounds are normal. She exhibits no distension and no mass. There is no tenderness. There is no rebound and no guarding.  Musculoskeletal: She exhibits no edema.  Lymphadenopathy:    She has no cervical adenopathy.          Assessment & Plan:  1. Chronic atrial fibrillation - PT with INR/Fingerstick  Patient's INR is therapeutic. Continue Coumadin at its current dose. Recheck in 6 weeks. Patient is also having increased cramping. Therefore I recommend that she try stretching muscles in her feet and calves at night prior to going to bed and even try over-the-counter tonic water.

## 2013-12-01 NOTE — Addendum Note (Signed)
Addended by: Sharmon Revere on: 12/01/2013 02:32 PM   Modules accepted: Orders

## 2014-01-12 ENCOUNTER — Encounter: Payer: Self-pay | Admitting: Family Medicine

## 2014-01-12 ENCOUNTER — Ambulatory Visit (INDEPENDENT_AMBULATORY_CARE_PROVIDER_SITE_OTHER): Payer: Medicare Other | Admitting: Family Medicine

## 2014-01-12 VITALS — BP 108/79 | HR 60 | Temp 98.1°F | Resp 18 | Ht 60.0 in | Wt 188.0 lb

## 2014-01-12 DIAGNOSIS — I4891 Unspecified atrial fibrillation: Secondary | ICD-10-CM

## 2014-01-12 LAB — PT WITH INR/FINGERSTICK
INR FINGERSTICK: 2.2 — AB (ref 0.80–1.20)
PT, fingerstick: 26.6 seconds — ABNORMAL HIGH (ref 10.4–12.5)

## 2014-01-12 NOTE — Progress Notes (Signed)
Subjective:    Patient ID: Kathy [name], female    DOB: [birth year], 80 y.o.   MRN: [id number]  HPI  12/01/13 Patient is here today for Coumadin recheck. She continues to take 2.5 mg of Coumadin on Tuesday Thursday and Saturday and 5 mg on every other day.  INR is 2.3.  At that time, my plan was: 1. Chronic atrial fibrillation - PT with INR/Fingerstick  Patient's INR is therapeutic. Continue Coumadin at its current dose. Recheck in 6 weeks. Patient is also having increased cramping. Therefore I recommend that she try stretching muscles in her feet and calves at night prior to going to bed and even try over-the-counter tonic water.  01/12/14 Patient is here today for recheck of her INR.  She is also requesting a flu vaccine. She denies any bleeding or bruising. She is currently taking 2.5 mg of Coumadin on Tuesday Thursday and Saturday and 5 mg on all other days.  INR is therapeutic at 2.2. Cramps are better with tonic water. She does complain of some joint stiffness in the  PIP and DIP joints of her fingers   Past Medical History  Diagnosis Date  . Hiatal hernia   . Diverticulitis   . PONV (postoperative nausea and vomiting)   . Shortness of breath     with exertion  . Arthritis     both shoulders  . Joint pain     both shoulders  . Hemorrhoids     internal  . Diverticulosis   . Urinary incontinence   . Nocturia   . Urinary frequency   . Sinus drainage   . Cataracts, bilateral   . Cancer     left breast  . Nasal congestion   . Breast cancer   . Hyperlipidemia     takes Zocor daily  . Hypertension     takes Amlodipine daily  . Obesity   . Macular degeneration 02/2003  . Osteoporosis   . Atrial fibrillation    Current Outpatient Prescriptions on File Prior to Visit  Medication Sig Dispense Refill  . Calcium Carbonate-Vitamin D (CALTRATE 600+D) 600-400 MG-UNIT per tablet Take 1 tablet by mouth 2 (two) times daily.        . cholecalciferol (VITAMIN D) 400 UNITS TABS  Take 400 Units by mouth daily. Take in addition to Caltrate with Vitamin D to get 1200 units of Vitamin D      . digoxin (LANOXIN) 0.125 MG tablet Take 0.125 mg by mouth daily. None on Sat or Sun      . letrozole (FEMARA) 2.5 MG tablet Take 1 tablet (2.5 mg total) by mouth daily.  90 tablet  3  . magnesium gluconate (MAGONATE) 500 MG tablet Take 500 mg by mouth 2 (two) times daily.      . metoprolol tartrate (LOPRESSOR) 25 MG tablet Take 50 mg by mouth 2 (two) times daily.      . simvastatin (ZOCOR) 20 MG tablet TAKE ONE TABLET BY MOUTH AT BEDTIME  30 tablet  11  . valsartan-hydrochlorothiazide (DIOVAN-HCT) 320-25 MG per tablet TAKE ONE TABLET BY MOUTH ONCE DAILY  30 tablet  11  . warfarin (COUMADIN) 5 MG tablet 2.5 mg Tues/Thurs/Sat - and 5 mg other days       No current facility-administered medications on file prior to visit.   No Known Allergies History   Social History  . Marital Status: Widowed    Spouse Name: N/A    Number of Children: N/A  .  Years of Education: N/A   Occupational History  . retired    Social History Main Topics  . Smoking status: Former Smoker -- 0.25 packs/day for 10 years    Types: Cigarettes    Quit date: 05/21/1971  . Smokeless tobacco: Never Used     Comment: quit in the 60's  . Alcohol Use: No  . Drug Use: No  . Sexual Activity: Not Currently   Other Topics Concern  . Not on file   Social History Narrative  . No narrative on file      Review of Systems  All other systems reviewed and are negative.      Objective:   Physical Exam  Vitals reviewed. Neck: Neck supple. No JVD present. No thyromegaly present.  Cardiovascular: Normal rate and normal heart sounds.   No murmur heard. Pulmonary/Chest: Effort normal and breath sounds normal. No respiratory distress. She has no wheezes. She has no rales.  Abdominal: Soft. Bowel sounds are normal. She exhibits no distension and no mass. There is no tenderness. There is no rebound and no  guarding.  Musculoskeletal: She exhibits no edema.  Lymphadenopathy:    She has no cervical adenopathy.          Assessment & Plan:  Atrial fibrillation, unspecified - Plan: PT with INR/Fingerstick, PT with INR/Fingerstick  Continue Coumadin at its current dose. Recheck INR in 4-6 weeks. The patient also received her flu vaccine today. I recommended trying Aspercreme over-the-counter. If not effective we could try Voltaren gel.

## 2014-02-23 ENCOUNTER — Ambulatory Visit (INDEPENDENT_AMBULATORY_CARE_PROVIDER_SITE_OTHER): Payer: Medicare Other | Admitting: Family Medicine

## 2014-02-23 ENCOUNTER — Encounter: Payer: Self-pay | Admitting: Family Medicine

## 2014-02-23 VITALS — BP 110/72 | HR 78 | Temp 97.8°F | Resp 16 | Ht 60.0 in | Wt 186.0 lb

## 2014-02-23 DIAGNOSIS — I4891 Unspecified atrial fibrillation: Secondary | ICD-10-CM

## 2014-02-23 LAB — PT WITH INR/FINGERSTICK
INR, fingerstick: 2.9 — ABNORMAL HIGH (ref 0.80–1.20)
PT FINGERSTICK: 34.8 s — AB (ref 10.4–12.5)

## 2014-02-23 MED ORDER — DICLOFENAC SODIUM 1 % TD GEL: 2.0000 g | Freq: Four times a day (QID) | TRANSDERMAL | Status: DC

## 2014-02-23 NOTE — Progress Notes (Signed)
Subjective:    Patient ID: Kathy [name], female    DOB: June 03, 1927, 80 y.o.   MRN: [id number]  HPI  Patient is here today for recheck of her INR.  She denies any bleeding or bruising. She is currently taking 2.5 mg of Coumadin on Tuesday Thursday and Saturday and 5 mg on all other days.  INR is therapeutic at 2.9.    Past Medical History  Diagnosis Date  . Hiatal hernia   . Diverticulitis   . PONV (postoperative nausea and vomiting)   . Shortness of breath     with exertion  . Arthritis     both shoulders  . Joint pain     both shoulders  . Hemorrhoids     internal  . Diverticulosis   . Urinary incontinence   . Nocturia   . Urinary frequency   . Sinus drainage   . Cataracts, bilateral   . Cancer     left breast  . Nasal congestion   . Breast cancer   . Hyperlipidemia     takes Zocor daily  . Hypertension     takes Amlodipine daily  . Obesity   . Macular degeneration 02/2003  . Osteoporosis   . Atrial fibrillation    Current Outpatient Prescriptions on File Prior to Visit  Medication Sig Dispense Refill  . Calcium Carbonate-Vitamin D (CALTRATE 600+D) 600-400 MG-UNIT per tablet Take 1 tablet by mouth 2 (two) times daily.        . cholecalciferol (VITAMIN D) 400 UNITS TABS Take 400 Units by mouth daily. Take in addition to Caltrate with Vitamin D to get 1200 units of Vitamin D      . digoxin (LANOXIN) 0.125 MG tablet Take 0.125 mg by mouth daily. None on Sat or Sun      . letrozole (FEMARA) 2.5 MG tablet Take 1 tablet (2.5 mg total) by mouth daily.  90 tablet  3  . magnesium gluconate (MAGONATE) 500 MG tablet Take 500 mg by mouth 2 (two) times daily.      . metoprolol tartrate (LOPRESSOR) 25 MG tablet Take 50 mg by mouth 2 (two) times daily.      . simvastatin (ZOCOR) 20 MG tablet TAKE ONE TABLET BY MOUTH AT BEDTIME  30 tablet  11  . valsartan-hydrochlorothiazide (DIOVAN-HCT) 320-25 MG per tablet TAKE ONE TABLET BY MOUTH ONCE DAILY  30 tablet  11  . warfarin  (COUMADIN) 5 MG tablet 2.5 mg Tues/Thurs/Sat - and 5 mg other days       No current facility-administered medications on file prior to visit.   No Known Allergies History   Social History  . Marital Status: Widowed    Spouse Name: N/A    Number of Children: N/A  . Years of Education: N/A   Occupational History  . retired    Social History Main Topics  . Smoking status: Former Smoker -- 0.25 packs/day for 10 years    Types: Cigarettes    Quit date: 05/21/1971  . Smokeless tobacco: Never Used     Comment: quit in the 60's  . Alcohol Use: No  . Drug Use: No  . Sexual Activity: Not Currently   Other Topics Concern  . Not on file   Social History Narrative  . No narrative on file      Review of Systems  All other systems reviewed and are negative.      Objective:   Physical Exam  Vitals reviewed. Neck: Neck supple.  No JVD present. No thyromegaly present.  Cardiovascular: Normal rate and normal heart sounds.   No murmur heard. Pulmonary/Chest: Effort normal and breath sounds normal. No respiratory distress. She has no wheezes. She has no rales.  Abdominal: Soft. Bowel sounds are normal. She exhibits no distension and no mass. There is no tenderness. There is no rebound and no guarding.  Musculoskeletal: She exhibits no edema.  Lymphadenopathy:    She has no cervical adenopathy.          Assessment & Plan:  Atrial fibrillation, unspecified - Plan: PT with INR/Fingerstick  Continue Coumadin at its current dose. Recheck INR in 4-6 weeks. I gave patient prescription for Voltaren gel to try 4 times a day as needed for pain and stiffness in the joints on her hand

## 2014-03-30 DIAGNOSIS — I482 Chronic atrial fibrillation: Secondary | ICD-10-CM | POA: Diagnosis not present

## 2014-03-30 DIAGNOSIS — I10 Essential (primary) hypertension: Secondary | ICD-10-CM | POA: Diagnosis not present

## 2014-03-30 DIAGNOSIS — R0602 Shortness of breath: Secondary | ICD-10-CM | POA: Diagnosis not present

## 2014-04-06 ENCOUNTER — Encounter: Payer: Self-pay | Admitting: Family Medicine

## 2014-04-06 ENCOUNTER — Ambulatory Visit (INDEPENDENT_AMBULATORY_CARE_PROVIDER_SITE_OTHER): Payer: Medicare Other | Admitting: Family Medicine

## 2014-04-06 VITALS — BP 142/82 | HR 80 | Temp 98.5°F | Resp 18 | Wt 186.0 lb

## 2014-04-06 DIAGNOSIS — Z7901 Long term (current) use of anticoagulants: Secondary | ICD-10-CM

## 2014-04-06 LAB — PT WITH INR/FINGERSTICK
INR FINGERSTICK: 2.4 — AB (ref 0.80–1.20)
PT, fingerstick: 28.2 seconds — ABNORMAL HIGH (ref 10.4–12.5)

## 2014-04-06 MED ORDER — TRAMADOL HCL 50 MG PO TABS: 50.0000 mg | ORAL_TABLET | Freq: Three times a day (TID) | ORAL | Status: DC | PRN

## 2014-04-06 NOTE — Progress Notes (Signed)
Subjective:    Patient ID: Kathy [name], female    DOB: Sep 19, 1927, 80 y.o.   MRN: [id number]  HPI  Patient is here today for recheck of her INR.  She denies any bleeding or bruising. She is currently taking 2.5 mg of Coumadin on Tuesday Thursday and Saturday and 5 mg on all other days.  INR is therapeutic at 2.4.  Continues to report pain in her knees from arthritis.  Unable to take NSAIDS.  Tylenol not effective.  Past Medical History  Diagnosis Date  . Hiatal hernia   . Diverticulitis   . PONV (postoperative nausea and vomiting)   . Shortness of breath     with exertion  . Arthritis     both shoulders  . Joint pain     both shoulders  . Hemorrhoids     internal  . Diverticulosis   . Urinary incontinence   . Nocturia   . Urinary frequency   . Sinus drainage   . Cataracts, bilateral   . Cancer     left breast  . Nasal congestion   . Breast cancer   . Hyperlipidemia     takes Zocor daily  . Hypertension     takes Amlodipine daily  . Obesity   . Macular degeneration 02/2003  . Osteoporosis   . Atrial fibrillation    Current Outpatient Prescriptions on File Prior to Visit  Medication Sig Dispense Refill  . Calcium Carbonate-Vitamin D (CALTRATE 600+D) 600-400 MG-UNIT per tablet Take 1 tablet by mouth 2 (two) times daily.      . cholecalciferol (VITAMIN D) 400 UNITS TABS Take 400 Units by mouth daily. Take in addition to Caltrate with Vitamin D to get 1200 units of Vitamin D    . diclofenac sodium (VOLTAREN) 1 % GEL Apply 2 g topically 4 (four) times daily. 1 Tube 3  . digoxin (LANOXIN) 0.125 MG tablet Take 0.125 mg by mouth daily. None on Sat or Sun    . letrozole (FEMARA) 2.5 MG tablet Take 1 tablet (2.5 mg total) by mouth daily. 90 tablet 3  . magnesium gluconate (MAGONATE) 500 MG tablet Take 500 mg by mouth 2 (two) times daily.    . metoprolol tartrate (LOPRESSOR) 25 MG tablet Take 50 mg by mouth 2 (two) times daily.    . simvastatin (ZOCOR) 20 MG tablet TAKE ONE  TABLET BY MOUTH AT BEDTIME 30 tablet 11  . valsartan-hydrochlorothiazide (DIOVAN-HCT) 320-25 MG per tablet TAKE ONE TABLET BY MOUTH ONCE DAILY 30 tablet 11  . warfarin (COUMADIN) 5 MG tablet 2.5 mg Tues/Thurs/Sat - and 5 mg other days     No current facility-administered medications on file prior to visit.   No Known Allergies History   Social History  . Marital Status: Widowed    Spouse Name: N/A    Number of Children: N/A  . Years of Education: N/A   Occupational History  . retired    Social History Main Topics  . Smoking status: Former Smoker -- 0.25 packs/day for 10 years    Types: Cigarettes    Quit date: 05/21/1971  . Smokeless tobacco: Never Used     Comment: quit in the 60's  . Alcohol Use: No  . Drug Use: No  . Sexual Activity: Not Currently   Other Topics Concern  . Not on file   Social History Narrative      Review of Systems  All other systems reviewed and are negative.  Objective:   Physical Exam  Constitutional: She appears well-developed and well-nourished.  Cardiovascular: Normal rate and normal heart sounds.   Pulmonary/Chest: Effort normal and breath sounds normal.  Vitals reviewed.         Assessment & Plan:  Long term current use of anticoagulant therapy - Plan: PT with INR/Fingerstick  Continue Coumadin at its current dose. Recheck INR in 4-6 weeks. I gave patient prescription for Tramadol 50 mg poq8 hrs prn arthritis pain (30).  Warned about AMS and constipation.

## 2014-04-08 ENCOUNTER — Telehealth: Payer: Self-pay | Admitting: Oncology

## 2014-04-08 NOTE — Telephone Encounter (Signed)
lvm for pt regarding to 8.11 appt moved to 8.16 per MD on Pal....mailed pt appt sched adn letter in the mail

## 2014-04-09 DIAGNOSIS — Z853 Personal history of malignant neoplasm of breast: Secondary | ICD-10-CM | POA: Diagnosis not present

## 2014-04-09 DIAGNOSIS — Z1231 Encounter for screening mammogram for malignant neoplasm of breast: Secondary | ICD-10-CM | POA: Diagnosis not present

## 2014-04-09 LAB — HM MAMMOGRAPHY

## 2014-04-11 ENCOUNTER — Encounter: Payer: Self-pay | Admitting: *Deleted

## 2014-05-10 DIAGNOSIS — H3531 Nonexudative age-related macular degeneration: Secondary | ICD-10-CM | POA: Diagnosis not present

## 2014-05-10 DIAGNOSIS — H2513 Age-related nuclear cataract, bilateral: Secondary | ICD-10-CM | POA: Diagnosis not present

## 2014-05-10 DIAGNOSIS — H524 Presbyopia: Secondary | ICD-10-CM | POA: Diagnosis not present

## 2014-05-10 DIAGNOSIS — H35033 Hypertensive retinopathy, bilateral: Secondary | ICD-10-CM | POA: Diagnosis not present

## 2014-05-10 DIAGNOSIS — H02839 Dermatochalasis of unspecified eye, unspecified eyelid: Secondary | ICD-10-CM | POA: Diagnosis not present

## 2014-05-21 ENCOUNTER — Ambulatory Visit: Payer: Medicare Other | Admitting: Family Medicine

## 2014-05-25 ENCOUNTER — Ambulatory Visit (INDEPENDENT_AMBULATORY_CARE_PROVIDER_SITE_OTHER): Payer: Medicare Other | Admitting: Family Medicine

## 2014-05-25 ENCOUNTER — Encounter: Payer: Self-pay | Admitting: Family Medicine

## 2014-05-25 VITALS — BP 100/54 | HR 64 | Temp 98.1°F | Resp 18 | Ht 60.0 in | Wt 183.0 lb

## 2014-05-25 DIAGNOSIS — E785 Hyperlipidemia, unspecified: Secondary | ICD-10-CM

## 2014-05-25 DIAGNOSIS — I4891 Unspecified atrial fibrillation: Secondary | ICD-10-CM

## 2014-05-25 DIAGNOSIS — I482 Chronic atrial fibrillation, unspecified: Secondary | ICD-10-CM

## 2014-05-25 DIAGNOSIS — I10 Essential (primary) hypertension: Secondary | ICD-10-CM | POA: Diagnosis not present

## 2014-05-25 LAB — PT WITH INR/FINGERSTICK
INR, fingerstick: 2.3 — ABNORMAL HIGH (ref 0.80–1.20)
PT, fingerstick: 27.6 seconds — ABNORMAL HIGH (ref 10.4–12.5)

## 2014-05-25 NOTE — Progress Notes (Signed)
Subjective:    Patient ID: Kathy [name], female    DOB: May 07, 1927, 81 y.o.   MRN: [id number]  HPI  Patient is here today for recheck of her INR.  She denies any bleeding or bruising. She is currently taking 2.5 mg of Coumadin on Tuesday Thursday and Saturday and 5 mg on all other days.  INR is therapeutic at 2.3.  Patient's blood pressure is very low today at 100/54. She does have some mild dizziness. She denies any syncope. She fell approximately 3 weeks ago but this was not due to dizziness, she tripped in her driveway. She denies any chest pain shortness of breath or dyspnea on exertion. She denies any myalgias or right upper quadrant pain. She is overdue for fasting lab work.  Past Medical History  Diagnosis Date  . Hiatal hernia   . Diverticulitis   . PONV (postoperative nausea and vomiting)   . Shortness of breath     with exertion  . Arthritis     both shoulders  . Joint pain     both shoulders  . Hemorrhoids     internal  . Diverticulosis   . Urinary incontinence   . Nocturia   . Urinary frequency   . Sinus drainage   . Cataracts, bilateral   . Cancer     left breast  . Nasal congestion   . Breast cancer   . Hyperlipidemia     takes Zocor daily  . Hypertension     takes Amlodipine daily  . Obesity   . Macular degeneration 02/2003  . Osteoporosis   . Atrial fibrillation    Current Outpatient Prescriptions on File Prior to Visit  Medication Sig Dispense Refill  . Calcium Carbonate-Vitamin D (CALTRATE 600+D) 600-400 MG-UNIT per tablet Take 1 tablet by mouth 2 (two) times daily.      . cholecalciferol (VITAMIN D) 400 UNITS TABS Take 400 Units by mouth daily. Take in addition to Caltrate with Vitamin D to get 1200 units of Vitamin D    . diclofenac sodium (VOLTAREN) 1 % GEL Apply 2 g topically 4 (four) times daily. 1 Tube 3  . digoxin (LANOXIN) 0.125 MG tablet Take 0.125 mg by mouth daily. None on Sat or Sun    . letrozole (FEMARA) 2.5 MG tablet Take 1 tablet  (2.5 mg total) by mouth daily. 90 tablet 3  . magnesium gluconate (MAGONATE) 500 MG tablet Take 500 mg by mouth 2 (two) times daily.    . metoprolol tartrate (LOPRESSOR) 25 MG tablet Take 50 mg by mouth 2 (two) times daily.    . simvastatin (ZOCOR) 20 MG tablet TAKE ONE TABLET BY MOUTH AT BEDTIME 30 tablet 11  . traMADol (ULTRAM) 50 MG tablet Take 1 tablet (50 mg total) by mouth every 8 (eight) hours as needed. 30 tablet 0  . valsartan-hydrochlorothiazide (DIOVAN-HCT) 320-25 MG per tablet TAKE ONE TABLET BY MOUTH ONCE DAILY 30 tablet 11  . warfarin (COUMADIN) 5 MG tablet 2.5 mg Tues/Thurs/Sat - and 5 mg other days     No current facility-administered medications on file prior to visit.   No Known Allergies History   Social History  . Marital Status: Widowed    Spouse Name: N/A    Number of Children: N/A  . Years of Education: N/A   Occupational History  . retired    Social History Main Topics  . Smoking status: Former Smoker -- 0.25 packs/day for 10 years    Types: Cigarettes  Quit date: 05/21/1971  . Smokeless tobacco: Never Used     Comment: quit in the 60's  . Alcohol Use: No  . Drug Use: No  . Sexual Activity: Not Currently   Other Topics Concern  . Not on file   Social History Narrative      Review of Systems  All other systems reviewed and are negative.      Objective:   Physical Exam  Constitutional: She appears well-developed and well-nourished.  Neck: Neck supple. No thyromegaly present.  Cardiovascular: Normal rate and normal heart sounds.  Exam reveals no gallop.   No murmur heard. Pulmonary/Chest: Effort normal and breath sounds normal. No respiratory distress. She has no wheezes. She has no rales. She exhibits no tenderness.  Abdominal: Soft. Bowel sounds are normal. She exhibits no distension and no mass. There is no tenderness. There is no rebound and no guarding.  Musculoskeletal: She exhibits edema.  Vitals reviewed.         Assessment &  Plan:  Essential hypertension - Plan: COMPLETE METABOLIC PANEL WITH GFR, Lipid panel, CBC with Differential/Platelet  HLD (hyperlipidemia) - Plan: COMPLETE METABOLIC PANEL WITH GFR, Lipid panel, CBC with Differential/Platelet  Chronic atrial fibrillation  Atrial fibrillation, unspecified - Plan: PT with INR/Fingerstick  Continue Coumadin at its current dose. Recheck INR in 4-6 weeks. I would like her patient to decrease Diovan HCTZ to 160/12.5 by mouth daily. Therefore I have asked her to cut this pill in half. I would recheck her blood pressure at her next follow-up visit. I have asked the patient to return fasting for a CBC, CMP, and fasting lipid panel. Goal LDL cholesterol is less than 130.

## 2014-05-29 ENCOUNTER — Other Ambulatory Visit: Payer: Medicare Other

## 2014-05-29 DIAGNOSIS — Z79899 Other long term (current) drug therapy: Secondary | ICD-10-CM

## 2014-05-29 DIAGNOSIS — E785 Hyperlipidemia, unspecified: Secondary | ICD-10-CM

## 2014-05-29 DIAGNOSIS — I10 Essential (primary) hypertension: Secondary | ICD-10-CM | POA: Diagnosis not present

## 2014-05-29 LAB — COMPLETE METABOLIC PANEL WITH GFR
ALBUMIN: 3.7 g/dL (ref 3.5–5.2)
ALT: 8 U/L (ref 0–35)
AST: 12 U/L (ref 0–37)
Alkaline Phosphatase: 48 U/L (ref 39–117)
BUN: 21 mg/dL (ref 6–23)
CHLORIDE: 102 meq/L (ref 96–112)
CO2: 30 mEq/L (ref 19–32)
Calcium: 9.1 mg/dL (ref 8.4–10.5)
Creat: 1 mg/dL (ref 0.50–1.10)
GFR, Est African American: 60 mL/min
GFR, Est Non African American: 52 mL/min — ABNORMAL LOW
Glucose, Bld: 105 mg/dL — ABNORMAL HIGH (ref 70–99)
Potassium: 4.1 mEq/L (ref 3.5–5.3)
SODIUM: 139 meq/L (ref 135–145)
TOTAL PROTEIN: 6.5 g/dL (ref 6.0–8.3)
Total Bilirubin: 0.8 mg/dL (ref 0.2–1.2)

## 2014-05-29 LAB — CBC WITH DIFFERENTIAL/PLATELET
Basophils Absolute: 0.1 10*3/uL (ref 0.0–0.1)
Basophils Relative: 1 % (ref 0–1)
EOS ABS: 0.1 10*3/uL (ref 0.0–0.7)
EOS PCT: 2 % (ref 0–5)
HEMATOCRIT: 42.4 % (ref 36.0–46.0)
Hemoglobin: 13.8 g/dL (ref 12.0–15.0)
LYMPHS ABS: 2.3 10*3/uL (ref 0.7–4.0)
Lymphocytes Relative: 34 % (ref 12–46)
MCH: 31.9 pg (ref 26.0–34.0)
MCHC: 32.5 g/dL (ref 30.0–36.0)
MCV: 97.9 fL (ref 78.0–100.0)
MONOS PCT: 7 % (ref 3–12)
MPV: 9.5 fL (ref 8.6–12.4)
Monocytes Absolute: 0.5 10*3/uL (ref 0.1–1.0)
NEUTROS ABS: 3.8 10*3/uL (ref 1.7–7.7)
NEUTROS PCT: 56 % (ref 43–77)
Platelets: 221 10*3/uL (ref 150–400)
RBC: 4.33 MIL/uL (ref 3.87–5.11)
RDW: 14 % (ref 11.5–15.5)
WBC: 6.8 10*3/uL (ref 4.0–10.5)

## 2014-05-29 LAB — LIPID PANEL
Cholesterol: 101 mg/dL (ref 0–200)
HDL: 35 mg/dL — ABNORMAL LOW (ref >39)
LDL Cholesterol: 40 mg/dL (ref 0–99)
TRIGLYCERIDES: 129 mg/dL (ref <150)
Total CHOL/HDL Ratio: 2.9 Ratio
VLDL: 26 mg/dL (ref 0–40)

## 2014-05-30 ENCOUNTER — Encounter: Payer: Self-pay | Admitting: Family Medicine

## 2014-07-06 ENCOUNTER — Encounter: Payer: Self-pay | Admitting: Family Medicine

## 2014-07-06 ENCOUNTER — Ambulatory Visit (INDEPENDENT_AMBULATORY_CARE_PROVIDER_SITE_OTHER): Payer: Medicare Other | Admitting: Family Medicine

## 2014-07-06 VITALS — BP 136/74 | HR 74 | Temp 98.0°F | Resp 16 | Ht 60.0 in | Wt 185.0 lb

## 2014-07-06 DIAGNOSIS — K5732 Diverticulitis of large intestine without perforation or abscess without bleeding: Secondary | ICD-10-CM | POA: Diagnosis not present

## 2014-07-06 DIAGNOSIS — I4891 Unspecified atrial fibrillation: Secondary | ICD-10-CM

## 2014-07-06 LAB — PT WITH INR/FINGERSTICK
INR FINGERSTICK: 2.3 — AB (ref 0.80–1.20)
PT, fingerstick: 28.1 seconds — ABNORMAL HIGH (ref 10.4–12.5)

## 2014-07-06 MED ORDER — CIPROFLOXACIN HCL 500 MG PO TABS: 500.0000 mg | ORAL_TABLET | Freq: Two times a day (BID) | ORAL | Status: DC

## 2014-07-06 MED ORDER — METRONIDAZOLE 500 MG PO TABS: 500.0000 mg | ORAL_TABLET | Freq: Two times a day (BID) | ORAL | Status: DC

## 2014-07-06 NOTE — Progress Notes (Signed)
Subjective:    Patient ID: Kathy [name], female    DOB: 08-14-1927, 81 y.o.   MRN: [id number]  HPI  Patient is here today for recheck of her INR.  She denies any bleeding or bruising. She is currently taking 2.5 mg of Coumadin on Tuesday Thursday and Saturday and 5 mg on all other days.  INR is therapeutic at 2.3.  Patient reports  24 hours of LLQ abd pain and nausea and vomiting x 1.  She reports subjective fever.  Denies constipation melena or hematochezia.  She does have history of diverticulosis.  Past Medical History  Diagnosis Date  . Hiatal hernia   . Diverticulitis   . PONV (postoperative nausea and vomiting)   . Shortness of breath     with exertion  . Arthritis     both shoulders  . Joint pain     both shoulders  . Hemorrhoids     internal  . Diverticulosis   . Urinary incontinence   . Nocturia   . Urinary frequency   . Sinus drainage   . Cataracts, bilateral   . Cancer     left breast  . Nasal congestion   . Breast cancer   . Hyperlipidemia     takes Zocor daily  . Hypertension     takes Amlodipine daily  . Obesity   . Macular degeneration 02/2003  . Osteoporosis   . Atrial fibrillation    Current Outpatient Prescriptions on File Prior to Visit  Medication Sig Dispense Refill  . Calcium Carbonate-Vitamin D (CALTRATE 600+D) 600-400 MG-UNIT per tablet Take 1 tablet by mouth 2 (two) times daily.      . cholecalciferol (VITAMIN D) 400 UNITS TABS Take 400 Units by mouth daily. Take in addition to Caltrate with Vitamin D to get 1200 units of Vitamin D    . diclofenac sodium (VOLTAREN) 1 % GEL Apply 2 g topically 4 (four) times daily. 1 Tube 3  . digoxin (LANOXIN) 0.125 MG tablet Take 0.125 mg by mouth daily. None on Sat or Sun    . letrozole (FEMARA) 2.5 MG tablet Take 1 tablet (2.5 mg total) by mouth daily. 90 tablet 3  . magnesium gluconate (MAGONATE) 500 MG tablet Take 500 mg by mouth 2 (two) times daily.    . metoprolol tartrate (LOPRESSOR) 25 MG tablet  Take 50 mg by mouth 2 (two) times daily.    . simvastatin (ZOCOR) 20 MG tablet TAKE ONE TABLET BY MOUTH AT BEDTIME 30 tablet 11  . traMADol (ULTRAM) 50 MG tablet Take 1 tablet (50 mg total) by mouth every 8 (eight) hours as needed. 30 tablet 0  . valsartan-hydrochlorothiazide (DIOVAN-HCT) 320-25 MG per tablet TAKE ONE TABLET BY MOUTH ONCE DAILY (Patient taking differently: TAKE ONE HALF TABLET BY MOUTH ONCE DAILY) 30 tablet 11  . warfarin (COUMADIN) 5 MG tablet 2.5 mg Tues/Thurs/Sat - and 5 mg other days     No current facility-administered medications on file prior to visit.   No Known Allergies History   Social History  . Marital Status: Widowed    Spouse Name: N/A  . Number of Children: N/A  . Years of Education: N/A   Occupational History  . retired    Social History Main Topics  . Smoking status: Former Smoker -- 0.25 packs/day for 10 years    Types: Cigarettes    Quit date: 05/21/1971  . Smokeless tobacco: Never Used     Comment: quit in the 60's  .  Alcohol Use: No  . Drug Use: No  . Sexual Activity: Not Currently   Other Topics Concern  . Not on file   Social History Narrative      Review of Systems  All other systems reviewed and are negative.      Objective:   Physical Exam  Constitutional: She appears well-developed and well-nourished.  Neck: Neck supple. No thyromegaly present.  Cardiovascular: Normal rate and normal heart sounds.  Exam reveals no gallop.   No murmur heard. Pulmonary/Chest: Effort normal and breath sounds normal. No respiratory distress. She has no wheezes. She has no rales. She exhibits no tenderness.  Abdominal: Soft. Bowel sounds are normal. She exhibits no distension and no mass. There is tenderness. There is no rebound and no guarding.  Musculoskeletal: She exhibits no edema.  Vitals reviewed.         Assessment & Plan:  Diverticulitis of colon - Plan: ciprofloxacin (CIPRO) 500 MG tablet, metroNIDAZOLE (FLAGYL) 500 MG  tablet  Atrial fibrillation, unspecified - Plan: PT with INR/Fingerstick  Continue Coumadin at its current dose. Recheck INR in 4-6 weeks. I am concerned she may be developing diverticulitis. I want her to begin MiraLAX 17 g by mouth 1 daily to facilitate regular bowel movements and also want her to start Cipro 500 mg by mouth twice a day for 10 days and Flagyl 500 mg by mouth twice a day for 10 days and recheck next week or immediately if worse.

## 2014-08-11 ENCOUNTER — Other Ambulatory Visit: Payer: Self-pay | Admitting: Family Medicine

## 2014-08-17 ENCOUNTER — Ambulatory Visit (INDEPENDENT_AMBULATORY_CARE_PROVIDER_SITE_OTHER): Payer: Medicare Other | Admitting: Family Medicine

## 2014-08-17 ENCOUNTER — Encounter: Payer: Self-pay | Admitting: Family Medicine

## 2014-08-17 DIAGNOSIS — I4891 Unspecified atrial fibrillation: Secondary | ICD-10-CM | POA: Diagnosis not present

## 2014-08-17 LAB — PT WITH INR/FINGERSTICK
INR FINGERSTICK: 2.2 — AB (ref 0.80–1.20)
PT, fingerstick: 26 seconds — ABNORMAL HIGH (ref 10.4–12.5)

## 2014-08-17 NOTE — Progress Notes (Signed)
Subjective:    Patient ID: Kathy [name], female    DOB: 1927-11-26, 81 y.o.   MRN: [id number]  HPI  Patient is here today for recheck of her INR.  She denies any bleeding or bruising. She is currently taking 2.5 mg of Coumadin on Tuesday Thursday and Saturday and 5 mg on all other days.  INR is therapeutic at 2.2.  Past Medical History  Diagnosis Date  . Hiatal hernia   . Diverticulitis   . PONV (postoperative nausea and vomiting)   . Shortness of breath     with exertion  . Arthritis     both shoulders  . Joint pain     both shoulders  . Hemorrhoids     internal  . Diverticulosis   . Urinary incontinence   . Nocturia   . Urinary frequency   . Sinus drainage   . Cataracts, bilateral   . Cancer     left breast  . Nasal congestion   . Breast cancer   . Hyperlipidemia     takes Zocor daily  . Hypertension     takes Amlodipine daily  . Obesity   . Macular degeneration 02/2003  . Osteoporosis   . Atrial fibrillation    Current Outpatient Prescriptions on File Prior to Visit  Medication Sig Dispense Refill  . Calcium Carbonate-Vitamin D (CALTRATE 600+D) 600-400 MG-UNIT per tablet Take 1 tablet by mouth 2 (two) times daily.      . cholecalciferol (VITAMIN D) 400 UNITS TABS Take 400 Units by mouth daily. Take in addition to Caltrate with Vitamin D to get 1200 units of Vitamin D    . diclofenac sodium (VOLTAREN) 1 % GEL Apply 2 g topically 4 (four) times daily. 1 Tube 3  . digoxin (LANOXIN) 0.125 MG tablet Take 0.125 mg by mouth daily. None on Sat or Sun    . letrozole (FEMARA) 2.5 MG tablet Take 1 tablet (2.5 mg total) by mouth daily. 90 tablet 3  . magnesium gluconate (MAGONATE) 500 MG tablet Take 500 mg by mouth 2 (two) times daily.    . metoprolol tartrate (LOPRESSOR) 25 MG tablet Take 50 mg by mouth 2 (two) times daily.    . metroNIDAZOLE (FLAGYL) 500 MG tablet Take 1 tablet (500 mg total) by mouth 2 (two) times daily. 20 tablet 0  . simvastatin (ZOCOR) 20 MG tablet  TAKE ONE TABLET BY MOUTH AT BEDTIME 30 tablet 11  . traMADol (ULTRAM) 50 MG tablet Take 1 tablet (50 mg total) by mouth every 8 (eight) hours as needed. 30 tablet 0  . valsartan-hydrochlorothiazide (DIOVAN-HCT) 320-25 MG per tablet TAKE ONE TABLET BY MOUTH ONCE DAILY (Patient taking differently: TAKE ONE HALF TABLET BY MOUTH ONCE DAILY) 30 tablet 11  . warfarin (COUMADIN) 5 MG tablet 2.5 mg Tues/Thurs/Sat - and 5 mg other days     No current facility-administered medications on file prior to visit.   No Known Allergies History   Social History  . Marital Status: Widowed    Spouse Name: N/A  . Number of Children: N/A  . Years of Education: N/A   Occupational History  . retired    Social History Main Topics  . Smoking status: Former Smoker -- 0.25 packs/day for 10 years    Types: Cigarettes    Quit date: 05/21/1971  . Smokeless tobacco: Never Used     Comment: quit in the 60's  . Alcohol Use: No  . Drug Use: No  . Sexual Activity:  Not Currently   Other Topics Concern  . Not on file   Social History Narrative      Review of Systems  All other systems reviewed and are negative.      Objective:   Physical Exam  Constitutional: She appears well-developed and well-nourished.  Neck: Neck supple. No thyromegaly present.  Cardiovascular: Normal rate and normal heart sounds.  Exam reveals no gallop.   No murmur heard. Pulmonary/Chest: Effort normal and breath sounds normal. No respiratory distress. She has no wheezes. She has no rales. She exhibits no tenderness.  Abdominal: Soft. Bowel sounds are normal. She exhibits no distension and no mass. There is no tenderness. There is no rebound and no guarding.  Musculoskeletal: She exhibits no edema.  Vitals reviewed.         Assessment & Plan:  Atrial fibrillation, unspecified - Plan: PT with INR/Fingerstick  Continue Coumadin at its current dose. Recheck INR in 4-6 weeks.

## 2014-08-22 DIAGNOSIS — C50912 Malignant neoplasm of unspecified site of left female breast: Secondary | ICD-10-CM | POA: Diagnosis not present

## 2014-09-28 ENCOUNTER — Encounter: Payer: Self-pay | Admitting: Family Medicine

## 2014-09-28 ENCOUNTER — Ambulatory Visit (INDEPENDENT_AMBULATORY_CARE_PROVIDER_SITE_OTHER): Payer: Medicare Other | Admitting: Family Medicine

## 2014-09-28 VITALS — BP 130/80 | HR 64 | Temp 98.1°F | Resp 18 | Wt 183.5 lb

## 2014-09-28 DIAGNOSIS — I4891 Unspecified atrial fibrillation: Secondary | ICD-10-CM

## 2014-09-28 DIAGNOSIS — M19019 Primary osteoarthritis, unspecified shoulder: Secondary | ICD-10-CM | POA: Diagnosis not present

## 2014-09-28 LAB — PT WITH INR/FINGERSTICK
INR, fingerstick: 2.2 — ABNORMAL HIGH (ref 0.80–1.20)
PT, fingerstick: 26.1 seconds — ABNORMAL HIGH (ref 10.4–12.5)

## 2014-09-28 MED ORDER — HYDROCODONE-ACETAMINOPHEN 5-325 MG PO TABS: 1.0000 | ORAL_TABLET | Freq: Four times a day (QID) | ORAL | Status: DC | PRN

## 2014-09-28 NOTE — Progress Notes (Signed)
Subjective:    Patient ID: Kathy [name], female    DOB: [birth year], 81 y.o.   MRN: [id number]  HPI  Patient is here today for recheck of her INR.  She denies any bleeding or bruising. She is currently taking 2.5 mg of Coumadin on Tuesday Thursday and Saturday and 5 mg on all other days.  INR is therapeutic at 2.2.  She is continuing to have pain in both shoulders due to arthritis and occasionally her lower back. She would like something she could take on an as-needed basis for pain.  Tylenol is insufficient. She cannot tolerate NSAIDs due to her Coumadin use. In tramadol has not worked for her in the past.  Past Medical History  Diagnosis Date  . Hiatal hernia   . Diverticulitis   . PONV (postoperative nausea and vomiting)   . Shortness of breath     with exertion  . Arthritis     both shoulders  . Joint pain     both shoulders  . Hemorrhoids     internal  . Diverticulosis   . Urinary incontinence   . Nocturia   . Urinary frequency   . Sinus drainage   . Cataracts, bilateral   . Cancer     left breast  . Nasal congestion   . Breast cancer   . Hyperlipidemia     takes Zocor daily  . Hypertension     takes Amlodipine daily  . Obesity   . Macular degeneration 02/2003  . Osteoporosis   . Atrial fibrillation    Current Outpatient Prescriptions on File Prior to Visit  Medication Sig Dispense Refill  . Calcium Carbonate-Vitamin D (CALTRATE 600+D) 600-400 MG-UNIT per tablet Take 1 tablet by mouth 2 (two) times daily.      . cholecalciferol (VITAMIN D) 400 UNITS TABS Take 400 Units by mouth daily. Take in addition to Caltrate with Vitamin D to get 1200 units of Vitamin D    . diclofenac sodium (VOLTAREN) 1 % GEL Apply 2 g topically 4 (four) times daily. 1 Tube 3  . digoxin (LANOXIN) 0.125 MG tablet Take 0.125 mg by mouth daily. None on Sat or Sun    . letrozole (FEMARA) 2.5 MG tablet Take 1 tablet (2.5 mg total) by mouth daily. 90 tablet 3  . magnesium gluconate (MAGONATE)  500 MG tablet Take 500 mg by mouth 2 (two) times daily.    . metoprolol tartrate (LOPRESSOR) 25 MG tablet Take 50 mg by mouth 2 (two) times daily.    . metroNIDAZOLE (FLAGYL) 500 MG tablet Take 1 tablet (500 mg total) by mouth 2 (two) times daily. 20 tablet 0  . simvastatin (ZOCOR) 20 MG tablet TAKE ONE TABLET BY MOUTH AT BEDTIME 30 tablet 11  . traMADol (ULTRAM) 50 MG tablet Take 1 tablet (50 mg total) by mouth every 8 (eight) hours as needed. 30 tablet 0  . valsartan-hydrochlorothiazide (DIOVAN-HCT) 320-25 MG per tablet TAKE ONE TABLET BY MOUTH ONCE DAILY (Patient taking differently: TAKE ONE HALF TABLET BY MOUTH ONCE DAILY) 30 tablet 11  . warfarin (COUMADIN) 5 MG tablet 2.5 mg Tues/Thurs/Sat - and 5 mg other days     No current facility-administered medications on file prior to visit.   No Known Allergies History   Social History  . Marital Status: Widowed    Spouse Name: N/A  . Number of Children: N/A  . Years of Education: N/A   Occupational History  . retired    Social History  Main Topics  . Smoking status: Former Smoker -- 0.25 packs/day for 10 years    Types: Cigarettes    Quit date: 05/21/1971  . Smokeless tobacco: Never Used     Comment: quit in the 60's  . Alcohol Use: No  . Drug Use: No  . Sexual Activity: Not Currently   Other Topics Concern  . Not on file   Social History Narrative      Review of Systems  All other systems reviewed and are negative.      Objective:   Physical Exam  Constitutional: She appears well-developed and well-nourished.  Neck: Neck supple. No thyromegaly present.  Cardiovascular: Normal rate and normal heart sounds.  Exam reveals no gallop.   No murmur heard. Pulmonary/Chest: Effort normal and breath sounds normal. No respiratory distress. She has no wheezes. She has no rales. She exhibits no tenderness.  Abdominal: Soft. Bowel sounds are normal. She exhibits no distension and no mass. There is no tenderness. There is no  rebound and no guarding.  Musculoskeletal: She exhibits no edema.  Vitals reviewed.         Assessment & Plan:  Atrial fibrillation, unspecified - Plan: PT with INR/Fingerstick, CANCELED: PT with INR/Fingerstick  Continue Coumadin at its current dose. Recheck INR in 4-6 weeks. I will give the patient 54 Norco to be used sparingly as needed for pain.

## 2014-11-06 ENCOUNTER — Encounter: Payer: Self-pay | Admitting: Genetic Counselor

## 2014-11-09 ENCOUNTER — Encounter: Payer: Self-pay | Admitting: Family Medicine

## 2014-11-09 ENCOUNTER — Ambulatory Visit (INDEPENDENT_AMBULATORY_CARE_PROVIDER_SITE_OTHER): Payer: Medicare Other | Admitting: Family Medicine

## 2014-11-09 VITALS — BP 132/68 | HR 66 | Temp 98.1°F | Resp 18 | Ht 60.0 in | Wt 184.0 lb

## 2014-11-09 DIAGNOSIS — I4891 Unspecified atrial fibrillation: Secondary | ICD-10-CM | POA: Diagnosis not present

## 2014-11-09 DIAGNOSIS — M19019 Primary osteoarthritis, unspecified shoulder: Secondary | ICD-10-CM | POA: Diagnosis not present

## 2014-11-09 LAB — PT WITH INR/FINGERSTICK
INR, fingerstick: 2.5 — ABNORMAL HIGH (ref 0.80–1.20)
PT FINGERSTICK: 30.2 s — AB (ref 10.4–12.5)

## 2014-11-09 MED ORDER — HYDROCODONE-ACETAMINOPHEN 5-325 MG PO TABS: 1.0000 | ORAL_TABLET | Freq: Four times a day (QID) | ORAL | Status: DC | PRN

## 2014-11-09 NOTE — Progress Notes (Signed)
Subjective:    Patient ID: Kathy [name], female    DOB: 03/18/1928, 81 y.o.   MRN: [id number]  HPI 09/28/14 Patient is here today for recheck of her INR.  She denies any bleeding or bruising. She is currently taking 2.5 mg of Coumadin on Tuesday Thursday and Saturday and 5 mg on all other days.  INR is therapeutic at 2.2.  She is continuing to have pain in both shoulders due to arthritis and occasionally her lower back. She would like something she could take on an as-needed basis for pain.  Tylenol is insufficient. She cannot tolerate NSAIDs due to her Coumadin use. Tramadol has not worked for her in the past.  At that time, my plan was: Continue Coumadin at its current dose. Recheck INR in 4-6 weeks. I will give the patient 42 Norco to be used sparingly as needed for pain.  11/09/14 Patient is here today for recheck.  Patient had cbc, cmp, flp in 2/16.  She is not due for colonoscopy or pap smear due to age.  She is here for recheck of her INR.  She is taking coumadin 5 mg poqday except T,R,Sat on which she takes 2.5 mg poqday.  Inr is 2.5.  Patient states that the pain medication I gave her the last visit worked well. Usually takes 1 pill at night to help her sleep. She denies any constipation on the medication. This helps calm down her low back pain sufficiently. She denies any falls or dizziness or lightheadedness on the pain medication  Past Medical History  Diagnosis Date  . Hiatal hernia   . Diverticulitis   . PONV (postoperative nausea and vomiting)   . Shortness of breath     with exertion  . Arthritis     both shoulders  . Joint pain     both shoulders  . Hemorrhoids     internal  . Diverticulosis   . Urinary incontinence   . Nocturia   . Urinary frequency   . Sinus drainage   . Cataracts, bilateral   . Cancer     left breast  . Nasal congestion   . Breast cancer   . Hyperlipidemia     takes Zocor daily  . Hypertension     takes Amlodipine daily  . Obesity   .  Macular degeneration 02/2003  . Osteoporosis   . Atrial fibrillation    Current Outpatient Prescriptions on File Prior to Visit  Medication Sig Dispense Refill  . Calcium Carbonate-Vitamin D (CALTRATE 600+D) 600-400 MG-UNIT per tablet Take 1 tablet by mouth 2 (two) times daily.      . cholecalciferol (VITAMIN D) 400 UNITS TABS Take 400 Units by mouth daily. Take in addition to Caltrate with Vitamin D to get 1200 units of Vitamin D    . diclofenac sodium (VOLTAREN) 1 % GEL Apply 2 g topically 4 (four) times daily. 1 Tube 3  . digoxin (LANOXIN) 0.125 MG tablet Take 0.125 mg by mouth daily. None on Sat or Sun    . HYDROcodone-acetaminophen (NORCO) 5-325 MG per tablet Take 1 tablet by mouth every 6 (six) hours as needed for moderate pain. 30 tablet 0  . letrozole (FEMARA) 2.5 MG tablet Take 1 tablet (2.5 mg total) by mouth daily. 90 tablet 3  . magnesium gluconate (MAGONATE) 500 MG tablet Take 500 mg by mouth 2 (two) times daily.    . metoprolol tartrate (LOPRESSOR) 25 MG tablet Take 50 mg by mouth 2 (two) times  daily.    . metroNIDAZOLE (FLAGYL) 500 MG tablet Take 1 tablet (500 mg total) by mouth 2 (two) times daily. 20 tablet 0  . simvastatin (ZOCOR) 20 MG tablet TAKE ONE TABLET BY MOUTH AT BEDTIME 30 tablet 11  . traMADol (ULTRAM) 50 MG tablet Take 1 tablet (50 mg total) by mouth every 8 (eight) hours as needed. 30 tablet 0  . valsartan-hydrochlorothiazide (DIOVAN-HCT) 320-25 MG per tablet TAKE ONE TABLET BY MOUTH ONCE DAILY (Patient taking differently: TAKE ONE HALF TABLET BY MOUTH ONCE DAILY) 30 tablet 11  . warfarin (COUMADIN) 5 MG tablet 2.5 mg Tues/Thurs/Sat - and 5 mg other days     No current facility-administered medications on file prior to visit.   No Known Allergies History   Social History  . Marital Status: Widowed    Spouse Name: N/A  . Number of Children: N/A  . Years of Education: N/A   Occupational History  . retired    Social History Main Topics  . Smoking status:  Former Smoker -- 0.25 packs/day for 10 years    Types: Cigarettes    Quit date: 05/21/1971  . Smokeless tobacco: Never Used     Comment: quit in the 60's  . Alcohol Use: No  . Drug Use: No  . Sexual Activity: Not Currently   Other Topics Concern  . Not on file   Social History Narrative      Review of Systems  All other systems reviewed and are negative.      Objective:   Physical Exam  Constitutional: She appears well-developed and well-nourished.  Neck: Neck supple. No thyromegaly present.  Cardiovascular: Normal rate and normal heart sounds.  Exam reveals no gallop.   No murmur heard. Pulmonary/Chest: Effort normal and breath sounds normal. No respiratory distress. She has no wheezes. She has no rales. She exhibits no tenderness.  Abdominal: Soft. Bowel sounds are normal. She exhibits no distension and no mass. There is no tenderness. There is no rebound and no guarding.  Musculoskeletal: She exhibits no edema.  Vitals reviewed.         Assessment & Plan:  Osteoarthritis of shoulder, unspecified laterality, unspecified osteoarthritis type - Plan: HYDROcodone-acetaminophen (NORCO) 5-325 MG per tablet, DISCONTINUED: HYDROcodone-acetaminophen (NORCO) 5-325 MG per tablet  Atrial fibrillation, unspecified - Plan: PT with INR/Fingerstick  Patient's INR is therapeutic. I'll make no changes in her Coumadin dose. I will refill the patient's hydrocodone. I will give her 30 tablets. This should last 6 weeks. I'll see the patient back in 6 weeks but I have asked her to come in fasting so that I can check a CBC CMP and a fasting lipid panel. The remainder of her preventative care is up-to-date. Patient has had Pneumovax 23 as well as Prevnar 13. I discussed with the patient and the shingles vaccine and she will consider

## 2014-11-22 ENCOUNTER — Other Ambulatory Visit: Payer: Self-pay | Admitting: Family Medicine

## 2014-11-22 MED ORDER — WARFARIN SODIUM 5 MG PO TABS: 10.0000 mg | ORAL_TABLET | Freq: Every day | ORAL | Status: DC

## 2014-11-22 NOTE — Telephone Encounter (Signed)
Medication refilled per protocol. 

## 2014-12-06 ENCOUNTER — Ambulatory Visit: Payer: Medicare Other | Admitting: Oncology

## 2014-12-06 ENCOUNTER — Other Ambulatory Visit: Payer: Medicare Other

## 2014-12-10 ENCOUNTER — Other Ambulatory Visit: Payer: Self-pay | Admitting: *Deleted

## 2014-12-10 DIAGNOSIS — C50919 Malignant neoplasm of unspecified site of unspecified female breast: Secondary | ICD-10-CM

## 2014-12-11 ENCOUNTER — Other Ambulatory Visit (HOSPITAL_BASED_OUTPATIENT_CLINIC_OR_DEPARTMENT_OTHER): Payer: Medicare Other

## 2014-12-11 ENCOUNTER — Ambulatory Visit (HOSPITAL_BASED_OUTPATIENT_CLINIC_OR_DEPARTMENT_OTHER): Payer: Medicare Other | Admitting: Oncology

## 2014-12-11 ENCOUNTER — Telehealth: Payer: Self-pay | Admitting: Oncology

## 2014-12-11 VITALS — BP 112/66 | HR 67 | Temp 98.4°F | Resp 18 | Ht 60.0 in | Wt 182.0 lb

## 2014-12-11 DIAGNOSIS — C50912 Malignant neoplasm of unspecified site of left female breast: Secondary | ICD-10-CM

## 2014-12-11 DIAGNOSIS — C50512 Malignant neoplasm of lower-outer quadrant of left female breast: Secondary | ICD-10-CM | POA: Diagnosis not present

## 2014-12-11 DIAGNOSIS — M8580 Other specified disorders of bone density and structure, unspecified site: Secondary | ICD-10-CM | POA: Diagnosis not present

## 2014-12-11 DIAGNOSIS — C50919 Malignant neoplasm of unspecified site of unspecified female breast: Secondary | ICD-10-CM

## 2014-12-11 DIAGNOSIS — Z170 Estrogen receptor positive status [ER+]: Secondary | ICD-10-CM | POA: Diagnosis not present

## 2014-12-11 LAB — COMPREHENSIVE METABOLIC PANEL (CC13)
ALT: 13 U/L (ref 0–55)
AST: 14 U/L (ref 5–34)
Albumin: 3.6 g/dL (ref 3.5–5.0)
Alkaline Phosphatase: 62 U/L (ref 40–150)
Anion Gap: 10 mEq/L (ref 3–11)
BUN: 22.1 mg/dL (ref 7.0–26.0)
CHLORIDE: 105 meq/L (ref 98–109)
CO2: 26 meq/L (ref 22–29)
CREATININE: 1 mg/dL (ref 0.6–1.1)
Calcium: 9.6 mg/dL (ref 8.4–10.4)
EGFR: 50 mL/min/{1.73_m2} — ABNORMAL LOW (ref >90)
GLUCOSE: 99 mg/dL (ref 70–140)
POTASSIUM: 4.2 meq/L (ref 3.5–5.1)
SODIUM: 140 meq/L (ref 136–145)
Total Bilirubin: 0.65 mg/dL (ref 0.20–1.20)
Total Protein: 6.5 g/dL (ref 6.4–8.3)

## 2014-12-11 LAB — CBC WITH DIFFERENTIAL/PLATELET
BASO%: 0.6 % (ref 0.0–2.0)
BASOS ABS: 0 10*3/uL (ref 0.0–0.1)
EOS ABS: 0.1 10*3/uL (ref 0.0–0.5)
EOS%: 1.6 % (ref 0.0–7.0)
HCT: 41.3 % (ref 34.8–46.6)
HEMOGLOBIN: 13.5 g/dL (ref 11.6–15.9)
LYMPH%: 45.3 % (ref 14.0–49.7)
MCH: 32.3 pg (ref 25.1–34.0)
MCHC: 32.7 g/dL (ref 31.5–36.0)
MCV: 98.8 fL (ref 79.5–101.0)
MONO#: 0.5 10*3/uL (ref 0.1–0.9)
MONO%: 7.2 % (ref 0.0–14.0)
NEUT#: 3.1 10*3/uL (ref 1.5–6.5)
NEUT%: 45.3 % (ref 38.4–76.8)
Platelets: 195 10*3/uL (ref 145–400)
RBC: 4.18 10*6/uL (ref 3.70–5.45)
RDW: 14.3 % (ref 11.2–14.5)
WBC: 6.9 10*3/uL (ref 3.9–10.3)
lymph#: 3.1 10*3/uL (ref 0.9–3.3)

## 2014-12-11 MED ORDER — LETROZOLE 2.5 MG PO TABS: 2.5000 mg | ORAL_TABLET | Freq: Every day | ORAL | Status: DC

## 2014-12-11 NOTE — Progress Notes (Signed)
ID: Kathy [name]   DOB: 06/21/1927  MR#: [id number]  OZD#:[id number]   PCP:  Odette Fraction, MD GYN: SUMadilyn Hook, MD] Other:    CHIEF COMPLAINT: Early stage estrogen receptor positive breast cancer  CURRENT TREATMENT: Tamoxifen  BREAST CANCER HISTORY:: From the original intake note:  The patient is an 81 year old [place] woman who had routine yearly screening mammography at [facility] 03/23/2011. A possible area of distortion was noted in the upper outer quadrant of the left breast and she was recalled for additional views November 29. There was a stellate mass in the outer lower quadrant of the left breast which appears more prominent. By ultrasound there was a suggestion of a 1 cm mass at the site of the scar.   BSGI was performed 03/31/2011 1 showed a focal intense isotopic area of activity measuring 1.6 cm at the site in question. Biopsy of this area was performed 04/07/2011 and showed [phone]) and invasive ductal carcinoma, grade 2, which was estrogen receptor positive at 100%, progesterone receptor positive at 69%, with an MIB-1-1 of 10%, and no HER-2 amplification.   The patient had her left lumpectomy, 05/06/2011. This showed multiple positive margins, so on 05/28/2011 she went back of to the operating table for margin clearance. This was successfully accomplished.   Her subsequent history is as detailed below.  INTERVAL HISTORY: Kathy [name] returns today for followup of her left breast carcinoma.  The interval history is unremarkable. She continues on anastrozole and does have some hot flashes at times but they're not very bothersome. Vaginal dryness is not a concern. She does have pain particularly in her arms and shoulders and in her lower back. I don't think this is going to be chiefly related to her anastrozole since that affects many  Small joints as well and she does not complain of pain except in those areas just mentioned.  REVIEW OF SYSTEMS: Kathy [name]  Still drives and  does her housework. She tells me she is developing cataracts and will need to get those taking care of. She has a little bit or runny nose, some sinus symptoms, but no cough or shortness of breath. She complains of an irregular heartbeat at times. She has heartburn. She has a little bit of numbness in her hands sometimes. She takes Norco for her pain, usually only at night. This does not constipate her. Likely the magnesium she takes daily is helping in that regard.  Dr. Dennard Schaumann follows her warfarin, which she tells me has been well-controlled. She has had no bruising or bleeding problems.Otherwise a detailed review of systems today was stable  PAST MEDICAL HISTORY: Past Medical History  Diagnosis Date  . Hiatal hernia   . Diverticulitis   . PONV (postoperative nausea and vomiting)   . Shortness of breath     with exertion  . Arthritis     both shoulders  . Joint pain     both shoulders  . Hemorrhoids     internal  . Diverticulosis   . Urinary incontinence   . Nocturia   . Urinary frequency   . Sinus drainage   . Cataracts, bilateral   . Cancer     left breast  . Nasal congestion   . Breast cancer   . Hyperlipidemia     takes Zocor daily  . Hypertension     takes Amlodipine daily  . Obesity   . Macular degeneration 02/2003  . Osteoporosis   . Atrial fibrillation     PAST SURGICAL  HISTORY: Past Surgical History  Procedure Laterality Date  . Left breast biopsy  1984  . Right breast biopsy  1993  . Bilateral knee replacements  2008  . Hemorrhoid surgery      outpatient "done in doctor's office"  . Colonoscopy    . Esophagogastroduodenoscopy    . Breast biopsy  05/04/2011    Procedure: BREAST BIOPSY WITH NEEDLE LOCALIZATION;  Surgeon: Judieth Keens, DO;  Location: [facility];  Service: General;  Laterality: Left;  . Joint replacement  2008    bilateral knee replacement  . Cardioversion N/A 09/15/2012    Procedure: CARDIOVERSION;  Surgeon: Laverda Page, MD;  Location[facility] ENDOSCOPY;  Service: Cardiovascular;  Laterality: N/A;  h&p in file-HW    FAMILY HISTORY Family History  Problem Relation Age of Onset  . Cancer Mother     pancreatic  . Cancer Father     esophageal  . Cancer Sister     ovarian  . Cancer Sister     lung  . Cancer Brother     kidney  . Cancer Sister     breast cancer  . Anesthesia problems Neg Hx   . Hypotension Neg Hx   . Malignant hyperthermia Neg Hx   . Pseudochol deficiency Neg Hx   . Heart disease Brother   . COPD Brother   . Cancer Brother     Kidney/Nephrectomy  The patient's father died from stomach or esophageal cancer at the age of 72. The patient's mother died from pancreas cancer at the age of 50 the patient has 79 sisters and 4 brothers. One brother died from kidney cancer and the other 3 from heart disease. One sister died of ovarian cancer which was diagnosed in her 10s. One sister died from "female organ cancer" diagnosed in her 9s. One sister died from lung cancer. The other surviving sister, currently 52, was just diagnosed with breast cancer.    Gynecologic history: GX P0. Menarche age 58, menopause in 45. She took hormone replacement approximately 8 years, stopping about 10 years ago. She had no complications from that treatment.   Social history: She used to work in a [employer], but is now retired. Her husband "S. L." used to work in a Writer. He died in 03/14/2013. The patient lives alone. She drives, does all her cooking and a house chores, and is normally active for an 81 year old. She does not exercise regularly.  ADVANCED DIRECTIVES: No advanced directives in place   HEALTH MAINTENANCE: Social History  Substance Use Topics  . Smoking status: Former Smoker -- 0.25 packs/day for 10 years    Types: Cigarettes    Quit date: 05/21/1971  . Smokeless tobacco: Never Used     Comment: quit in the 60's  . Alcohol Use: No   Colonoscopy: 2006  PAP: 2000  Bone density: 04/06/2013 at  [facility] showed a T score of -1.1 (stable to improved) Cholesterol: controlled w. Medication.     No Known Allergies  Current Outpatient Prescriptions  Medication Sig Dispense Refill  . Calcium Carbonate-Vitamin D (CALTRATE 600+D) 600-400 MG-UNIT per tablet Take 1 tablet by mouth 2 (two) times daily.      . cholecalciferol (VITAMIN D) 400 UNITS TABS Take 400 Units by mouth daily. Take in addition to Caltrate with Vitamin D to get 1200 units of Vitamin D    . diclofenac sodium (VOLTAREN) 1 % GEL Apply 2 g topically 4 (four) times daily. 1 Tube 3  .  digoxin (LANOXIN) 0.125 MG tablet Take 0.125 mg by mouth daily. None on Sat or Sun    . HYDROcodone-acetaminophen (NORCO) 5-325 MG per tablet Take 1 tablet by mouth every 6 (six) hours as needed for moderate pain. 30 tablet 0  . letrozole (FEMARA) 2.5 MG tablet Take 1 tablet (2.5 mg total) by mouth daily. 90 tablet 3  . magnesium gluconate (MAGONATE) 500 MG tablet Take 500 mg by mouth 2 (two) times daily.    . metoprolol tartrate (LOPRESSOR) 25 MG tablet Take 50 mg by mouth 2 (two) times daily.    . simvastatin (ZOCOR) 20 MG tablet TAKE ONE TABLET BY MOUTH AT BEDTIME 30 tablet 11  . traMADol (ULTRAM) 50 MG tablet Take 1 tablet (50 mg total) by mouth every 8 (eight) hours as needed. 30 tablet 0  . valsartan-hydrochlorothiazide (DIOVAN-HCT) 320-25 MG per tablet TAKE ONE TABLET BY MOUTH ONCE DAILY 30 tablet 5  . warfarin (COUMADIN) 5 MG tablet Take 2 tablets (10 mg total) by mouth daily. 60 tablet 2   No current facility-administered medications for this visit.    OBJECTIVE: elderly white woman in no acute distress Filed Vitals:   12/11/14 1326  BP: 112/66  Pulse: 67  Temp: 98.4 F (36.9 C)  Resp: 18     Body mass index is 35.54 kg/(m^2).    ECOG FS: 1 Filed Weights   12/11/14 1326  Weight: 182 lb (82.555 kg)   Sclerae unicteric Oropharynx clear and moist-- no thrush or other lesions No cervical or supraclavicular adenopathy Lungs no  rales or rhonchi Heart regular rate and rhythm Abd soft,  Obese,nontender, positive bowel sounds MSK no focal spinal tenderness, no upper extremity lymphedema , limited range of motion in both shoulders Neuro: nonfocal, well oriented, appropriate affect Breasts:  The right breast is unremarkable. The left breast is status post lumpectomy. There is some distortion in the area around the nipple and some scar tissue associated with this. There is no evidence of local recurrence. The left axilla is benign     LAB RESULTS: Lab Results  Component Value Date   WBC 6.9 12/11/2014   NEUTROABS 3.1 12/11/2014   HGB 13.5 12/11/2014   HCT 41.3 12/11/2014   MCV 98.8 12/11/2014   PLT 195 12/11/2014      Chemistry      Component Value Date/Time   NA 140 12/11/2014 1248   NA 139 05/29/2014 1014   K 4.2 12/11/2014 1248   K 4.1 05/29/2014 1014   CL 102 05/29/2014 1014   CL 102 05/06/2012 0959   CO2 26 12/11/2014 1248   CO2 30 05/29/2014 1014   BUN 22.1 12/11/2014 1248   BUN 21 05/29/2014 1014   CREATININE 1.0 12/11/2014 1248   CREATININE 1.00 05/29/2014 1014   CREATININE 0.82 09/15/2012 1048      Component Value Date/Time   CALCIUM 9.6 12/11/2014 1248   CALCIUM 9.1 05/29/2014 1014   ALKPHOS 62 12/11/2014 1248   ALKPHOS 48 05/29/2014 1014   AST 14 12/11/2014 1248   AST 12 05/29/2014 1014   ALT 13 12/11/2014 1248   ALT 8 05/29/2014 1014   BILITOT 0.65 12/11/2014 1248   BILITOT 0.8 05/29/2014 1014       STUDIES: Most recent bone density at [facility] 04/06/2013 showed osteopenia with a T score of -1.1  Most recent mammogram at [facility] on 04/06/2013 was unremarkable.   ASSESSMENT: 81 y.o. Kathy [name] woman   (1)  status post left lumpectomy with subsequent  margin clearance January of 2013 for a T1c NX, (stage I) invasive ductal carcinoma, grade 2, strongly estrogen and progesterone receptor positive, with a low MIB-1, and no HER-2 amplification.   (2) on letrozole since February  2013.  (a) Dexa scan 04/06/2013 showed mild osteopenia (T -1.1)   PLAN: Kathy [name] continues to do well as far as her breast cancer is concerned. The plan is to continue letrozole through February 2018.  She is aware that this medication can worsen osteopenia so she will have a repeat bone density scan this December at [facility]. I have entered those orders.  She will return to see me in one year. She knows to call for any problems that may develop before then.   Kathy [name] C    12/11/2014

## 2014-12-11 NOTE — Telephone Encounter (Signed)
Appointments made and avs printed for patient,dexa and mammo 12 16 16  1;00 orders faxed

## 2014-12-14 ENCOUNTER — Encounter: Payer: Self-pay | Admitting: Family Medicine

## 2014-12-14 ENCOUNTER — Ambulatory Visit (INDEPENDENT_AMBULATORY_CARE_PROVIDER_SITE_OTHER): Payer: Medicare Other | Admitting: Family Medicine

## 2014-12-14 VITALS — BP 130/70 | HR 62 | Temp 98.2°F | Resp 16 | Ht 60.0 in | Wt 181.0 lb

## 2014-12-14 DIAGNOSIS — I4891 Unspecified atrial fibrillation: Secondary | ICD-10-CM | POA: Diagnosis not present

## 2014-12-14 DIAGNOSIS — E785 Hyperlipidemia, unspecified: Secondary | ICD-10-CM | POA: Diagnosis not present

## 2014-12-14 LAB — LIPID PANEL
CHOL/HDL RATIO: 3.3 ratio (ref <=5.0)
Cholesterol: 115 mg/dL — ABNORMAL LOW (ref 125–200)
HDL: 35 mg/dL — AB (ref >=46)
LDL CALC: 49 mg/dL (ref <130)
TRIGLYCERIDES: 153 mg/dL — AB (ref <150)
VLDL: 31 mg/dL — ABNORMAL HIGH (ref <30)

## 2014-12-14 LAB — PT WITH INR/FINGERSTICK
INR, fingerstick: 2.1 — ABNORMAL HIGH (ref 0.80–1.20)
PT FINGERSTICK: 24.9 s — AB (ref 10.4–12.5)

## 2014-12-14 NOTE — Progress Notes (Signed)
Subjective:    Patient ID: Kathy [name], female    DOB: 1927-11-27, 81 y.o.   MRN: [id number]  HPI 09/28/14 Patient is here today for recheck of her INR.  She denies any bleeding or bruising. She is currently taking 2.5 mg of Coumadin on Tuesday Thursday and Saturday and 5 mg on all other days.  INR is therapeutic at 2.2.  She is continuing to have pain in both shoulders due to arthritis and occasionally her lower back. She would like something she could take on an as-needed basis for pain.  Tylenol is insufficient. She cannot tolerate NSAIDs due to her Coumadin use. Tramadol has not worked for her in the past.  At that time, my plan was: Continue Coumadin at its current dose. Recheck INR in 4-6 weeks. I will give the patient 57 Norco to be used sparingly as needed for pain.  11/09/14 Patient is here today for recheck.  Patient had cbc, cmp, flp in 2/16.  She is not due for colonoscopy or pap smear due to age.  She is here for recheck of her INR.  She is taking coumadin 5 mg poqday except T,R,Sat on which she takes 2.5 mg poqday.  Inr is 2.5.  Patient states that the pain medication I gave her the last visit worked well. Usually takes 1 pill at night to help her sleep. She denies any constipation on the medication. This helps calm down her low back pain sufficiently. She denies any falls or dizziness or lightheadedness on the pain medication.  At that time, my plan was: Patient's INR is therapeutic. I'll make no changes in her Coumadin dose. I will refill the patient's hydrocodone. I will give her 30 tablets. This should last 6 weeks. I'll see the patient back in 6 weeks but I have asked her to come in fasting so that I can check a CBC CMP and a fasting lipid panel. The remainder of her preventative care is up-to-date. Patient has had Pneumovax 23 as well as Prevnar 13. I discussed with the patient and the shingles vaccine and she will consider  12/14/14 Patient's INR today is therapeutic. She is  currently taking Coumadin 5 mg every day except on Thursday and Tuesday and Saturday when she takes 2.5 mg. She denies any bleeding or bruising. Her cancer specialist recently checked a CMP and a CBC which were normal. She is due for fasting lipid panel  Past Medical History  Diagnosis Date  . Hiatal hernia   . Diverticulitis   . PONV (postoperative nausea and vomiting)   . Shortness of breath     with exertion  . Arthritis     both shoulders  . Joint pain     both shoulders  . Hemorrhoids     internal  . Diverticulosis   . Urinary incontinence   . Nocturia   . Urinary frequency   . Sinus drainage   . Cataracts, bilateral   . Cancer     left breast  . Nasal congestion   . Breast cancer   . Hyperlipidemia     takes Zocor daily  . Hypertension     takes Amlodipine daily  . Obesity   . Macular degeneration 02/2003  . Osteoporosis   . Atrial fibrillation    Current Outpatient Prescriptions on File Prior to Visit  Medication Sig Dispense Refill  . Calcium Carbonate-Vitamin D (CALTRATE 600+D) 600-400 MG-UNIT per tablet Take 1 tablet by mouth 2 (two) times daily.      Marland Kitchen  cholecalciferol (VITAMIN D) 400 UNITS TABS Take 400 Units by mouth daily. Take in addition to Caltrate with Vitamin D to get 1200 units of Vitamin D    . digoxin (LANOXIN) 0.125 MG tablet Take 0.125 mg by mouth daily. None on Sat or Sun    . HYDROcodone-acetaminophen (NORCO) 5-325 MG per tablet Take 1 tablet by mouth every 6 (six) hours as needed for moderate pain. 30 tablet 0  . letrozole (FEMARA) 2.5 MG tablet Take 1 tablet (2.5 mg total) by mouth daily. 90 tablet 3  . magnesium gluconate (MAGONATE) 500 MG tablet Take 500 mg by mouth 2 (two) times daily.    . metoprolol tartrate (LOPRESSOR) 25 MG tablet Take 50 mg by mouth 2 (two) times daily.    . simvastatin (ZOCOR) 20 MG tablet TAKE ONE TABLET BY MOUTH AT BEDTIME 30 tablet 11  . valsartan-hydrochlorothiazide (DIOVAN-HCT) 320-25 MG per tablet TAKE ONE TABLET  BY MOUTH ONCE DAILY 30 tablet 5  . warfarin (COUMADIN) 5 MG tablet Take 2 tablets (10 mg total) by mouth daily. (Patient taking differently: Take 5 mg by mouth daily. 2.5 mg t, th, sat and 5 mg other days) 60 tablet 2   No current facility-administered medications on file prior to visit.   No Known Allergies Social History   Social History  . Marital Status: Widowed    Spouse Name: N/A  . Number of Children: N/A  . Years of Education: N/A   Occupational History  . retired    Social History Main Topics  . Smoking status: Former Smoker -- 0.25 packs/day for 10 years    Types: Cigarettes    Quit date: 05/21/1971  . Smokeless tobacco: Never Used     Comment: quit in the 60's  . Alcohol Use: No  . Drug Use: No  . Sexual Activity: Not Currently   Other Topics Concern  . Not on file   Social History Narrative      Review of Systems  All other systems reviewed and are negative.      Objective:   Physical Exam  Constitutional: She appears well-developed and well-nourished.  Neck: Neck supple. No thyromegaly present.  Cardiovascular: Normal rate and normal heart sounds.  Exam reveals no gallop.   No murmur heard. Pulmonary/Chest: Effort normal and breath sounds normal. No respiratory distress. She has no wheezes. She has no rales. She exhibits no tenderness.  Abdominal: Soft. Bowel sounds are normal. She exhibits no distension and no mass. There is no tenderness. There is no rebound and no guarding.  Musculoskeletal: She exhibits no edema.  Vitals reviewed.         Assessment & Plan:  HLD (hyperlipidemia) - Plan: Lipid panel  Atrial fibrillation, unspecified - Plan: PT with INR/Fingerstick  Coumadin is therapeutic. I'll make no changes in her Coumadin dose at the present time. Recheck INR in 6 weeks. CBC and CMP have just been checked by her cancer specialist. I have reviewed these today. I will check a fasting lipid panel

## 2015-01-07 ENCOUNTER — Other Ambulatory Visit: Payer: Self-pay | Admitting: Oncology

## 2015-01-25 ENCOUNTER — Encounter: Payer: Self-pay | Admitting: Family Medicine

## 2015-01-25 ENCOUNTER — Ambulatory Visit (INDEPENDENT_AMBULATORY_CARE_PROVIDER_SITE_OTHER): Payer: Medicare Other | Admitting: Family Medicine

## 2015-01-25 VITALS — BP 108/60 | HR 64 | Temp 98.7°F | Resp 16 | Ht 60.0 in | Wt 180.0 lb

## 2015-01-25 DIAGNOSIS — Z23 Encounter for immunization: Secondary | ICD-10-CM

## 2015-01-25 DIAGNOSIS — I4891 Unspecified atrial fibrillation: Secondary | ICD-10-CM

## 2015-01-25 LAB — PT WITH INR/FINGERSTICK
INR FINGERSTICK: 2.2 — AB (ref 0.80–1.20)
PT, fingerstick: 26.5 seconds — ABNORMAL HIGH (ref 10.4–12.5)

## 2015-01-25 NOTE — Addendum Note (Signed)
Addended by: Shary Decamp B on: 01/25/2015 02:19 PM   Modules accepted: Orders

## 2015-01-25 NOTE — Progress Notes (Signed)
Subjective:    Patient ID: Kathy [name], female    DOB: [birth year], 81 y.o.   MRN: [id number]  HPI  Patient's INR today is therapeutic. She is currently taking Coumadin 5 mg every day except on Thursday and Tuesday and Saturday when she takes 2.5 mg. She denies any bleeding or bruising.  Past Medical History  Diagnosis Date  . Hiatal hernia   . Diverticulitis   . PONV (postoperative nausea and vomiting)   . Shortness of breath     with exertion  . Arthritis     both shoulders  . Joint pain     both shoulders  . Hemorrhoids     internal  . Diverticulosis   . Urinary incontinence   . Nocturia   . Urinary frequency   . Sinus drainage   . Cataracts, bilateral   . Cancer     left breast  . Nasal congestion   . Breast cancer   . Hyperlipidemia     takes Zocor daily  . Hypertension     takes Amlodipine daily  . Obesity   . Macular degeneration 02/2003  . Osteoporosis   . Atrial fibrillation    Current Outpatient Prescriptions on File Prior to Visit  Medication Sig Dispense Refill  . Calcium Carbonate-Vitamin D (CALTRATE 600+D) 600-400 MG-UNIT per tablet Take 1 tablet by mouth 2 (two) times daily.      . cholecalciferol (VITAMIN D) 400 UNITS TABS Take 400 Units by mouth daily. Take in addition to Caltrate with Vitamin D to get 1200 units of Vitamin D    . digoxin (LANOXIN) 0.125 MG tablet Take 0.125 mg by mouth daily. None on Sat or Sun    . HYDROcodone-acetaminophen (NORCO) 5-325 MG per tablet Take 1 tablet by mouth every 6 (six) hours as needed for moderate pain. 30 tablet 0  . letrozole (FEMARA) 2.5 MG tablet TAKE 1 TABLET BY MOUTH DAILY. 90 tablet 3  . magnesium gluconate (MAGONATE) 500 MG tablet Take 500 mg by mouth 2 (two) times daily.    . metoprolol tartrate (LOPRESSOR) 25 MG tablet Take 50 mg by mouth 2 (two) times daily.    . simvastatin (ZOCOR) 20 MG tablet TAKE ONE TABLET BY MOUTH AT BEDTIME 30 tablet 11  . valsartan-hydrochlorothiazide (DIOVAN-HCT) 320-25 MG  per tablet TAKE ONE TABLET BY MOUTH ONCE DAILY 30 tablet 5  . vitamin B-12 (CYANOCOBALAMIN) 1000 MCG tablet Take 1,000 mcg by mouth daily.    Marland Kitchen warfarin (COUMADIN) 5 MG tablet Take 2 tablets (10 mg total) by mouth daily. (Patient taking differently: Take 5 mg by mouth daily. 2.5 mg t, th, sat and 5 mg other days) 60 tablet 2   No current facility-administered medications on file prior to visit.   No Known Allergies Social History   Social History  . Marital Status: Widowed    Spouse Name: N/A  . Number of Children: N/A  . Years of Education: N/A   Occupational History  . retired    Social History Main Topics  . Smoking status: Former Smoker -- 0.25 packs/day for 10 years    Types: Cigarettes    Quit date: 05/21/1971  . Smokeless tobacco: Never Used     Comment: quit in the 60's  . Alcohol Use: No  . Drug Use: No  . Sexual Activity: Not Currently   Other Topics Concern  . Not on file   Social History Narrative      Review of Systems  All  other systems reviewed and are negative.      Objective:   Physical Exam  Constitutional: She appears well-developed and well-nourished.  Neck: Neck supple. No thyromegaly present.  Cardiovascular: Normal rate and normal heart sounds.  Exam reveals no gallop.   No murmur heard. Pulmonary/Chest: Effort normal and breath sounds normal. No respiratory distress. She has no wheezes. She has no rales. She exhibits no tenderness.  Abdominal: Soft. Bowel sounds are normal. She exhibits no distension and no mass. There is no tenderness. There is no rebound and no guarding.  Musculoskeletal: She exhibits no edema.  Vitals reviewed.         Assessment & Plan:  No diagnosis found. Coumadin is therapeutic. I'll make no changes in her Coumadin dose at the present time. Recheck INR in 6 weeks.

## 2015-01-28 DIAGNOSIS — C50912 Malignant neoplasm of unspecified site of left female breast: Secondary | ICD-10-CM | POA: Diagnosis not present

## 2015-01-30 DIAGNOSIS — N63 Unspecified lump in breast: Secondary | ICD-10-CM | POA: Diagnosis not present

## 2015-01-31 DIAGNOSIS — N61 Mastitis without abscess: Secondary | ICD-10-CM | POA: Diagnosis not present

## 2015-01-31 DIAGNOSIS — N63 Unspecified lump in breast: Secondary | ICD-10-CM | POA: Diagnosis not present

## 2015-01-31 LAB — HM MAMMOGRAPHY

## 2015-02-14 ENCOUNTER — Encounter: Payer: Self-pay | Admitting: *Deleted

## 2015-02-26 ENCOUNTER — Encounter: Payer: Self-pay | Admitting: Family Medicine

## 2015-03-08 ENCOUNTER — Ambulatory Visit (INDEPENDENT_AMBULATORY_CARE_PROVIDER_SITE_OTHER): Payer: Medicare Other | Admitting: Family Medicine

## 2015-03-08 ENCOUNTER — Encounter: Payer: Self-pay | Admitting: Family Medicine

## 2015-03-08 VITALS — BP 98/64 | HR 62 | Temp 98.3°F | Resp 18 | Ht 60.0 in | Wt 178.0 lb

## 2015-03-08 DIAGNOSIS — I4891 Unspecified atrial fibrillation: Secondary | ICD-10-CM

## 2015-03-08 DIAGNOSIS — M25512 Pain in left shoulder: Secondary | ICD-10-CM

## 2015-03-08 LAB — PT WITH INR/FINGERSTICK
INR, fingerstick: 3 — ABNORMAL HIGH (ref 0.80–1.20)
PT, fingerstick: 35.7 seconds — ABNORMAL HIGH (ref 10.4–12.5)

## 2015-03-08 NOTE — Progress Notes (Signed)
Subjective:    Patient ID: Kathy [name], female    DOB: [birth year], 81 y.o.   MRN: [id number]  HPI 01/25/15 Patient's INR today is therapeutic. She is currently taking Coumadin 5 mg every day except on Thursday and Tuesday and Saturday when she takes 2.5 mg. She denies any bleeding or bruising.  At that time, my plan was: Coumadin is therapeutic. I'll make no changes in her Coumadin dose at the present time. Recheck INR in 6 weeks.   03/08/15  She is here today to recheck her INR. She is currently taking Coumadin 5 mg every day except Tuesday Thursday and Saturday when she takes 2.5 mg a day. She also complains of 2 weeks of severe pain in her left shoulder. She denies any falls or injuries. Abduction is limited to an 45 due to severe pain. She also has pain with internal and start rotation. She states it aches and throbs at night and keeps her from sleeping. She has a positive empty can sign. She has a positive Hawkins sign. Past Medical History  Diagnosis Date  . Hiatal hernia   . Diverticulitis   . PONV (postoperative nausea and vomiting)   . Shortness of breath     with exertion  . Arthritis     both shoulders  . Joint pain     both shoulders  . Hemorrhoids     internal  . Diverticulosis   . Urinary incontinence   . Nocturia   . Urinary frequency   . Sinus drainage   . Cataracts, bilateral   . Cancer     left breast  . Nasal congestion   . Breast cancer   . Hyperlipidemia     takes Zocor daily  . Hypertension     takes Amlodipine daily  . Obesity   . Macular degeneration 02/2003  . Osteoporosis   . Atrial fibrillation    Current Outpatient Prescriptions on File Prior to Visit  Medication Sig Dispense Refill  . Calcium Carbonate-Vitamin D (CALTRATE 600+D) 600-400 MG-UNIT per tablet Take 1 tablet by mouth 2 (two) times daily.      . cholecalciferol (VITAMIN D) 400 UNITS TABS Take 400 Units by mouth daily. Take in addition to Caltrate with Vitamin D to get 1200  units of Vitamin D    . digoxin (LANOXIN) 0.125 MG tablet Take 0.125 mg by mouth daily. None on Sat or Sun    . HYDROcodone-acetaminophen (NORCO) 5-325 MG per tablet Take 1 tablet by mouth every 6 (six) hours as needed for moderate pain. 30 tablet 0  . letrozole (FEMARA) 2.5 MG tablet TAKE 1 TABLET BY MOUTH DAILY. 90 tablet 3  . magnesium gluconate (MAGONATE) 500 MG tablet Take 500 mg by mouth 2 (two) times daily.    . metoprolol tartrate (LOPRESSOR) 25 MG tablet Take 50 mg by mouth 2 (two) times daily.    . simvastatin (ZOCOR) 20 MG tablet TAKE ONE TABLET BY MOUTH AT BEDTIME 30 tablet 11  . valsartan-hydrochlorothiazide (DIOVAN-HCT) 320-25 MG per tablet TAKE ONE TABLET BY MOUTH ONCE DAILY 30 tablet 5  . vitamin B-12 (CYANOCOBALAMIN) 1000 MCG tablet Take 1,000 mcg by mouth daily.    Marland Kitchen warfarin (COUMADIN) 5 MG tablet Take 2 tablets (10 mg total) by mouth daily. (Patient taking differently: Take 5 mg by mouth daily. 2.5 mg t, th, sat and 5 mg other days) 60 tablet 2   No current facility-administered medications on file prior to visit.   No Known  Allergies Social History   Social History  . Marital Status: Widowed    Spouse Name: N/A  . Number of Children: N/A  . Years of Education: N/A   Occupational History  . retired    Social History Main Topics  . Smoking status: Former Smoker -- 0.25 packs/day for 10 years    Types: Cigarettes    Quit date: 05/21/1971  . Smokeless tobacco: Never Used     Comment: quit in the 60's  . Alcohol Use: No  . Drug Use: No  . Sexual Activity: Not Currently   Other Topics Concern  . Not on file   Social History Narrative      Review of Systems  All other systems reviewed and are negative.      Objective:   Physical Exam  Constitutional: She appears well-developed and well-nourished.  Neck: Neck supple. No thyromegaly present.  Cardiovascular: Normal rate and normal heart sounds.  Exam reveals no gallop.   No murmur  heard. Pulmonary/Chest: Effort normal and breath sounds normal. No respiratory distress. She has no wheezes. She has no rales. She exhibits no tenderness.  Abdominal: Soft. Bowel sounds are normal. She exhibits no distension and no mass. There is no tenderness. There is no rebound and no guarding.  Musculoskeletal: She exhibits no edema.       Left shoulder: She exhibits decreased range of motion, tenderness, pain and decreased strength. She exhibits no effusion, no crepitus and no deformity.  Vitals reviewed.         Assessment & Plan:  Left shoulder pain  Atrial fibrillation, unspecified - Plan: PT with INR/Fingerstick  Coumadin is therapeutic at 3.0. I will not make any changes in her Coumadin dose. I believe she has subacromial bursitis/rotator cuff tendinitis. Using sterile technique, I injected her left shoulder with a mixture of 2 mL of lidocaine, 2 mL of Marcaine, and 2 mL of 40 mg per mL Kenalog. The patient tolerated the procedure well without complication. Recheck in 6 weeks.

## 2015-04-01 DIAGNOSIS — I482 Chronic atrial fibrillation: Secondary | ICD-10-CM | POA: Diagnosis not present

## 2015-04-01 DIAGNOSIS — R0602 Shortness of breath: Secondary | ICD-10-CM | POA: Diagnosis not present

## 2015-04-01 DIAGNOSIS — I10 Essential (primary) hypertension: Secondary | ICD-10-CM | POA: Diagnosis not present

## 2015-04-12 DIAGNOSIS — M85852 Other specified disorders of bone density and structure, left thigh: Secondary | ICD-10-CM | POA: Diagnosis not present

## 2015-04-19 ENCOUNTER — Ambulatory Visit (INDEPENDENT_AMBULATORY_CARE_PROVIDER_SITE_OTHER): Payer: Medicare Other | Admitting: Family Medicine

## 2015-04-19 ENCOUNTER — Encounter: Payer: Self-pay | Admitting: Family Medicine

## 2015-04-19 ENCOUNTER — Ambulatory Visit: Payer: Medicare Other | Admitting: Family Medicine

## 2015-04-19 VITALS — BP 100/68 | HR 64 | Temp 97.9°F | Resp 18 | Ht 60.0 in | Wt 173.0 lb

## 2015-04-19 DIAGNOSIS — I4891 Unspecified atrial fibrillation: Secondary | ICD-10-CM

## 2015-04-19 DIAGNOSIS — M19019 Primary osteoarthritis, unspecified shoulder: Secondary | ICD-10-CM

## 2015-04-19 DIAGNOSIS — Z1239 Encounter for other screening for malignant neoplasm of breast: Secondary | ICD-10-CM

## 2015-04-19 DIAGNOSIS — Z1231 Encounter for screening mammogram for malignant neoplasm of breast: Secondary | ICD-10-CM | POA: Diagnosis not present

## 2015-04-19 LAB — PT WITH INR/FINGERSTICK
INR, fingerstick: 1.8 — ABNORMAL HIGH (ref 0.80–1.20)
PT FINGERSTICK: 21.5 s — AB (ref 10.4–12.5)

## 2015-04-19 MED ORDER — HYDROCODONE-ACETAMINOPHEN 5-325 MG PO TABS: 1.0000 | ORAL_TABLET | Freq: Four times a day (QID) | ORAL | Status: DC | PRN

## 2015-04-19 NOTE — Progress Notes (Signed)
Subjective:    Patient ID: Kathy [name], female    DOB: [birth year], 81 y.o.   MRN: [id number]  HPI 01/25/15 Patient's INR today is therapeutic. She is currently taking Coumadin 5 mg every day except on Thursday and Tuesday and Saturday when she takes 2.5 mg. She denies any bleeding or bruising.  At that time, my plan was: Coumadin is therapeutic. I'll make no changes in her Coumadin dose at the present time. Recheck INR in 6 weeks.   03/08/15  She is here today to recheck her INR. She is currently taking Coumadin 5 mg every day except Tuesday Thursday and Saturday when she takes 2.5 mg a day. She also complains of 2 weeks of severe pain in her left shoulder. She denies any falls or injuries. Abduction is limited to an 45 due to severe pain. She also has pain with internal and start rotation. She states it aches and throbs at night and keeps her from sleeping. She has a positive empty can sign. She has a positive Hawkins sign.  At that time, my plan was: Coumadin is therapeutic at 3.0. I will not make any changes in her Coumadin dose. I believe she has subacromial bursitis/rotator cuff tendinitis. Using sterile technique, I injected her left shoulder with a mixture of 2 mL of lidocaine, 2 mL of Marcaine, and 2 mL of 40 mg per mL Kenalog. The patient tolerated the procedure well without complication. Recheck in 6 weeks.    04/19/15 Her shoulder has done much better since the injection. However she is requesting a refill on the pain medication which she uses sparingly for low back pain. She takes 1 pill every so often for low back pain. She is unable to take NSAIDs because of her Coumadin. She only takes the narcotic pain medication 1 Tylenol was not able to control her back pain. She also requests that I schedule her for her mammogram given her history of breast cancer which I will be glad to do. Her INR is subtherapeutic today at 1.8. However she took a half a pill yesterday and she has not yet  taken the medication this morning. Furthermore she has been stable the last several times she has been here so I hesitate to make any changes in her Coumadin dose Past Medical History  Diagnosis Date  . Hiatal hernia   . Diverticulitis   . PONV (postoperative nausea and vomiting)   . Shortness of breath     with exertion  . Arthritis     both shoulders  . Joint pain     both shoulders  . Hemorrhoids     internal  . Diverticulosis   . Urinary incontinence   . Nocturia   . Urinary frequency   . Sinus drainage   . Cataracts, bilateral   . Cancer ([place])     left breast  . Nasal congestion   . Breast cancer ([place])   . Hyperlipidemia     takes Zocor daily  . Hypertension     takes Amlodipine daily  . Obesity   . Macular degeneration 02/2003  . Osteoporosis   . Atrial fibrillation [facility])    Current Outpatient Prescriptions on File Prior to Visit  Medication Sig Dispense Refill  . Calcium Carbonate-Vitamin D (CALTRATE 600+D) 600-400 MG-UNIT per tablet Take 1 tablet by mouth 2 (two) times daily.      . cholecalciferol (VITAMIN D) 400 UNITS TABS Take 400 Units by mouth daily. Take in addition to Caltrate  with Vitamin D to get 1200 units of Vitamin D    . digoxin (LANOXIN) 0.125 MG tablet Take 0.125 mg by mouth daily. None on Sat or Sun    . HYDROcodone-acetaminophen (NORCO) 5-325 MG per tablet Take 1 tablet by mouth every 6 (six) hours as needed for moderate pain. 30 tablet 0  . letrozole (FEMARA) 2.5 MG tablet TAKE 1 TABLET BY MOUTH DAILY. 90 tablet 3  . magnesium gluconate (MAGONATE) 500 MG tablet Take 500 mg by mouth 2 (two) times daily.    . metoprolol tartrate (LOPRESSOR) 25 MG tablet Take 50 mg by mouth 2 (two) times daily.    . simvastatin (ZOCOR) 20 MG tablet TAKE ONE TABLET BY MOUTH AT BEDTIME 30 tablet 11  . valsartan-hydrochlorothiazide (DIOVAN-HCT) 320-25 MG per tablet TAKE ONE TABLET BY MOUTH ONCE DAILY 30 tablet 5  . vitamin B-12 (CYANOCOBALAMIN) 1000 MCG tablet Take  1,000 mcg by mouth daily.    Marland Kitchen warfarin (COUMADIN) 5 MG tablet Take 2 tablets (10 mg total) by mouth daily. (Patient taking differently: Take 5 mg by mouth daily. 2.5mg   t, th, sat and 5 mg other days) 60 tablet 2   No current facility-administered medications on file prior to visit.   No Known Allergies Social History   Social History  . Marital Status: Widowed    Spouse Name: N/A  . Number of Children: N/A  . Years of Education: N/A   Occupational History  . retired    Social History Main Topics  . Smoking status: Former Smoker -- 0.25 packs/day for 10 years    Types: Cigarettes    Quit date: 05/21/1971  . Smokeless tobacco: Never Used     Comment: quit in the 60's  . Alcohol Use: No  . Drug Use: No  . Sexual Activity: Not Currently   Other Topics Concern  . Not on file   Social History Narrative      Review of Systems  All other systems reviewed and are negative.      Objective:   Physical Exam  Constitutional: She appears well-developed and well-nourished.  Neck: Neck supple. No thyromegaly present.  Cardiovascular: Normal rate and normal heart sounds.  Exam reveals no gallop.   No murmur heard. Pulmonary/Chest: Effort normal and breath sounds normal. No respiratory distress. She has no wheezes. She has no rales. She exhibits no tenderness.  Abdominal: Soft. Bowel sounds are normal. She exhibits no distension and no mass. There is no tenderness. There is no rebound and no guarding.  Musculoskeletal: She exhibits no edema.  Vitals reviewed.         Assessment & Plan:  Osteoarthritis of shoulder, unspecified laterality, unspecified osteoarthritis type - Plan: HYDROcodone-acetaminophen (NORCO) 5-325 MG tablet  Atrial fibrillation, unspecified - Plan: PT with INR/Fingerstick  Breast cancer screening, high risk patient - Plan: [facility]  I will refill the patient's Norco 5/325. I gave the patient 30 tablets. She uses sparingly as needed  for pain in her shoulder as well as pain in her lower back. Thankfully her shoulder is doing much better since the cortisone injection. Although her Coumadin is slightly subtherapeutic today, she has been so steady the last several times I will make no changes in her Coumadin dose. I will recheck it again in 4-6 weeks. I will also schedule the patient for mammogram. Her heart rate is well controlled today at 64 bpm. Otherwise she is doing well with no concerns.

## 2015-05-16 DIAGNOSIS — H524 Presbyopia: Secondary | ICD-10-CM | POA: Diagnosis not present

## 2015-05-16 DIAGNOSIS — H00025 Hordeolum internum left lower eyelid: Secondary | ICD-10-CM | POA: Diagnosis not present

## 2015-05-16 DIAGNOSIS — I10 Essential (primary) hypertension: Secondary | ICD-10-CM | POA: Diagnosis not present

## 2015-05-16 DIAGNOSIS — H35033 Hypertensive retinopathy, bilateral: Secondary | ICD-10-CM | POA: Diagnosis not present

## 2015-05-16 DIAGNOSIS — H00022 Hordeolum internum right lower eyelid: Secondary | ICD-10-CM | POA: Diagnosis not present

## 2015-05-16 DIAGNOSIS — H353131 Nonexudative age-related macular degeneration, bilateral, early dry stage: Secondary | ICD-10-CM | POA: Diagnosis not present

## 2015-05-16 DIAGNOSIS — H25813 Combined forms of age-related cataract, bilateral: Secondary | ICD-10-CM | POA: Diagnosis not present

## 2015-05-16 DIAGNOSIS — H02839 Dermatochalasis of unspecified eye, unspecified eyelid: Secondary | ICD-10-CM | POA: Diagnosis not present

## 2015-05-31 ENCOUNTER — Ambulatory Visit (INDEPENDENT_AMBULATORY_CARE_PROVIDER_SITE_OTHER): Payer: Medicare Other | Admitting: Family Medicine

## 2015-05-31 ENCOUNTER — Encounter: Payer: Self-pay | Admitting: Family Medicine

## 2015-05-31 VITALS — BP 126/82 | HR 68 | Temp 97.5°F | Resp 18 | Wt 174.0 lb

## 2015-05-31 DIAGNOSIS — Z7901 Long term (current) use of anticoagulants: Secondary | ICD-10-CM

## 2015-05-31 LAB — PT WITH INR/FINGERSTICK
INR, fingerstick: 1.9 — ABNORMAL HIGH (ref 0.80–1.20)
PT, fingerstick: 22.9 seconds — ABNORMAL HIGH (ref 10.4–12.5)

## 2015-05-31 NOTE — Progress Notes (Signed)
Subjective:    Patient ID: Kathy [name], female    DOB: 1927/07/02, 82 y.o.   MRN: [id number]  HPI 01/25/15 Patient's INR today is therapeutic. She is currently taking Coumadin 5 mg every day except on Thursday and Tuesday and Saturday when she takes 2.5 mg. She denies any bleeding or bruising.  At that time, my plan was: Coumadin is therapeutic. I'll make no changes in her Coumadin dose at the present time. Recheck INR in 6 weeks.   03/08/15  She is here today to recheck her INR. She is currently taking Coumadin 5 mg every day except Tuesday Thursday and Saturday when she takes 2.5 mg a day. She also complains of 2 weeks of severe pain in her left shoulder. She denies any falls or injuries. Abduction is limited to an 45 due to severe pain. She also has pain with internal and start rotation. She states it aches and throbs at night and keeps her from sleeping. She has a positive empty can sign. She has a positive Hawkins sign.  At that time, my plan was: Coumadin is therapeutic at 3.0. I will not make any changes in her Coumadin dose. I believe she has subacromial bursitis/rotator cuff tendinitis. Using sterile technique, I injected her left shoulder with a mixture of 2 mL of lidocaine, 2 mL of Marcaine, and 2 mL of 40 mg per mL Kenalog. The patient tolerated the procedure well without complication. Recheck in 6 weeks.    04/19/15 Her shoulder has done much better since the injection. However she is requesting a refill on the pain medication which she uses sparingly for low back pain. She takes 1 pill every so often for low back pain. She is unable to take NSAIDs because of her Coumadin. She only takes the narcotic pain medication 1 Tylenol was not able to control her back pain. She also requests that I schedule her for her mammogram given her history of breast cancer which I will be glad to do. Her INR is subtherapeutic today at 1.8. However she took a half a pill yesterday and she has not yet  taken the medication this morning. Furthermore she has been stable the last several times she has been here so I hesitate to make any changes in her Coumadin dose.  At that time, my plan was: I will refill the patient's Norco 5/325. I gave the patient 30 tablets. She uses sparingly as needed for pain in her shoulder as well as pain in her lower back. Thankfully her shoulder is doing much better since the cortisone injection. Although her Coumadin is slightly subtherapeutic today, she has been so steady the last several times I will make no changes in her Coumadin dose. I will recheck it again in 4-6 weeks. I will also schedule the patient for mammogram. Her heart rate is well controlled today at 64 bpm. Otherwise she is doing well with no concerns.  05/31/15 Currently on coumadin 2.5 mg on Tues, Thursday and Sat, and 5 mg on all other days.  INR today is 1.9.   Past Medical History  Diagnosis Date  . Hiatal hernia   . Diverticulitis   . PONV (postoperative nausea and vomiting)   . Shortness of breath     with exertion  . Arthritis     both shoulders  . Joint pain     both shoulders  . Hemorrhoids     internal  . Diverticulosis   . Urinary incontinence   . Nocturia   .  Urinary frequency   . Sinus drainage   . Cataracts, bilateral   . Cancer ([place])     left breast  . Nasal congestion   . Breast cancer ([place])   . Hyperlipidemia     takes Zocor daily  . Hypertension     takes Amlodipine daily  . Obesity   . Macular degeneration 02/2003  . Osteoporosis   . Atrial fibrillation [facility])    Current Outpatient Prescriptions on File Prior to Visit  Medication Sig Dispense Refill  . Calcium Carbonate-Vitamin D (CALTRATE 600+D) 600-400 MG-UNIT per tablet Take 1 tablet by mouth 2 (two) times daily.      . cholecalciferol (VITAMIN D) 400 UNITS TABS Take 400 Units by mouth daily. Take in addition to Caltrate with Vitamin D to get 1200 units of Vitamin D    . digoxin (LANOXIN) 0.125 MG tablet Take  0.125 mg by mouth daily. None on Sat or Sun    . HYDROcodone-acetaminophen (NORCO) 5-325 MG tablet Take 1 tablet by mouth every 6 (six) hours as needed for moderate pain. 30 tablet 0  . letrozole (FEMARA) 2.5 MG tablet TAKE 1 TABLET BY MOUTH DAILY. 90 tablet 3  . magnesium gluconate (MAGONATE) 500 MG tablet Take 500 mg by mouth 2 (two) times daily.    . metoprolol tartrate (LOPRESSOR) 25 MG tablet Take 50 mg by mouth 2 (two) times daily.    . simvastatin (ZOCOR) 20 MG tablet TAKE ONE TABLET BY MOUTH AT BEDTIME 30 tablet 11  . valsartan-hydrochlorothiazide (DIOVAN-HCT) 320-25 MG per tablet TAKE ONE TABLET BY MOUTH ONCE DAILY 30 tablet 5  . vitamin B-12 (CYANOCOBALAMIN) 1000 MCG tablet Take 1,000 mcg by mouth daily.    Marland Kitchen warfarin (COUMADIN) 5 MG tablet Take 2 tablets (10 mg total) by mouth daily. (Patient taking differently: Take 5 mg by mouth daily. 2.5mg   t, th, sat and 5 mg other days) 60 tablet 2   No current facility-administered medications on file prior to visit.   No Known Allergies Social History   Social History  . Marital Status: Widowed    Spouse Name: N/A  . Number of Children: N/A  . Years of Education: N/A   Occupational History  . retired    Social History Main Topics  . Smoking status: Former Smoker -- 0.25 packs/day for 10 years    Types: Cigarettes    Quit date: 05/21/1971  . Smokeless tobacco: Never Used     Comment: quit in the 60's  . Alcohol Use: No  . Drug Use: No  . Sexual Activity: Not Currently   Other Topics Concern  . Not on file   Social History Narrative      Review of Systems  All other systems reviewed and are negative.      Objective:   Physical Exam  Constitutional: She appears well-developed and well-nourished.  Neck: Neck supple. No thyromegaly present.  Cardiovascular: Normal rate and normal heart sounds.  Exam reveals no gallop.   No murmur heard. Pulmonary/Chest: Effort normal and breath sounds normal. No respiratory distress.  She has no wheezes. She has no rales. She exhibits no tenderness.  Abdominal: Soft. Bowel sounds are normal. She exhibits no distension and no mass. There is no tenderness. There is no rebound and no guarding.  Musculoskeletal: She exhibits no edema.  Vitals reviewed.         Assessment & Plan:  Long term current use of anticoagulant therapy - Plan: PT with INR/Fingerstick  INR is subtherapeutic at 1.9. Increase Coumadin to 5 mg every day except Tuesday and Thursday. On Tuesday Thursday take 2.5 mg recheck in 4-6 weeks.

## 2015-06-14 ENCOUNTER — Encounter: Payer: Self-pay | Admitting: Family Medicine

## 2015-06-14 DIAGNOSIS — H35033 Hypertensive retinopathy, bilateral: Secondary | ICD-10-CM | POA: Insufficient documentation

## 2015-06-19 DIAGNOSIS — H2513 Age-related nuclear cataract, bilateral: Secondary | ICD-10-CM | POA: Diagnosis not present

## 2015-06-19 DIAGNOSIS — H2511 Age-related nuclear cataract, right eye: Secondary | ICD-10-CM | POA: Diagnosis not present

## 2015-07-02 ENCOUNTER — Encounter: Payer: Self-pay | Admitting: *Deleted

## 2015-07-12 ENCOUNTER — Encounter: Payer: Self-pay | Admitting: Family Medicine

## 2015-07-12 ENCOUNTER — Ambulatory Visit (INDEPENDENT_AMBULATORY_CARE_PROVIDER_SITE_OTHER): Payer: Medicare Other | Admitting: Family Medicine

## 2015-07-12 DIAGNOSIS — I4891 Unspecified atrial fibrillation: Secondary | ICD-10-CM

## 2015-07-12 LAB — PT WITH INR/FINGERSTICK
INR, fingerstick: 2.6 — ABNORMAL HIGH (ref 0.80–1.20)
PT, fingerstick: 30.9 seconds — ABNORMAL HIGH (ref 10.4–12.5)

## 2015-07-12 NOTE — Progress Notes (Signed)
Subjective:    Patient ID: Kathy [name], female    DOB: [birth year], 82 y.o.   MRN: [id number]  HPI INR today is 2.6. Patient is currently on 5 mg of Coumadin every day except Tuesdays and Saturdays. On Tuesday and Saturday she takes 2.5 mg. She denies any bleeding or bruising or complications  Past Medical History  Diagnosis Date  . Hiatal hernia   . Diverticulitis   . PONV (postoperative nausea and vomiting)   . Shortness of breath     with exertion  . Arthritis     both shoulders  . Joint pain     both shoulders  . Hemorrhoids     internal  . Diverticulosis   . Urinary incontinence   . Nocturia   . Urinary frequency   . Sinus drainage   . Cataracts, bilateral   . Cancer ([place])     left breast  . Nasal congestion   . Breast cancer ([place])   . Hyperlipidemia     takes Zocor daily  . Hypertension     takes Amlodipine daily  . Obesity   . Macular degeneration 02/2003  . Osteoporosis   . Atrial fibrillation [facility])    Current Outpatient Prescriptions on File Prior to Visit  Medication Sig Dispense Refill  . Calcium Carbonate-Vitamin D (CALTRATE 600+D) 600-400 MG-UNIT per tablet Take 1 tablet by mouth 2 (two) times daily.      . cholecalciferol (VITAMIN D) 400 UNITS TABS Take 400 Units by mouth daily. Take in addition to Caltrate with Vitamin D to get 1200 units of Vitamin D    . digoxin (LANOXIN) 0.125 MG tablet Take 0.125 mg by mouth daily. None on Sat or Sun    . HYDROcodone-acetaminophen (NORCO) 5-325 MG tablet Take 1 tablet by mouth every 6 (six) hours as needed for moderate pain. 30 tablet 0  . letrozole (FEMARA) 2.5 MG tablet TAKE 1 TABLET BY MOUTH DAILY. 90 tablet 3  . magnesium gluconate (MAGONATE) 500 MG tablet Take 500 mg by mouth 2 (two) times daily.    . metoprolol tartrate (LOPRESSOR) 25 MG tablet Take 50 mg by mouth 2 (two) times daily.    . simvastatin (ZOCOR) 20 MG tablet TAKE ONE TABLET BY MOUTH AT BEDTIME 30 tablet 11  . valsartan-hydrochlorothiazide  (DIOVAN-HCT) 320-25 MG per tablet TAKE ONE TABLET BY MOUTH ONCE DAILY 30 tablet 5  . vitamin B-12 (CYANOCOBALAMIN) 1000 MCG tablet Take 1,000 mcg by mouth daily.    Marland Kitchen warfarin (COUMADIN) 5 MG tablet Take 2 tablets (10 mg total) by mouth daily. (Patient taking differently: Take 5 mg by mouth daily. 2.5mg   tues & sat and 5 mg other days) 60 tablet 2   No current facility-administered medications on file prior to visit.   No Known Allergies Social History   Social History  . Marital Status: Widowed    Spouse Name: N/A  . Number of Children: N/A  . Years of Education: N/A   Occupational History  . retired    Social History Main Topics  . Smoking status: Former Smoker -- 0.25 packs/day for 10 years    Types: Cigarettes    Quit date: 05/21/1971  . Smokeless tobacco: Never Used     Comment: quit in the 60's  . Alcohol Use: No  . Drug Use: No  . Sexual Activity: Not Currently   Other Topics Concern  . Not on file   Social History Narrative      Review of  Systems  All other systems reviewed and are negative.      Objective:   Physical Exam  Constitutional: She appears well-developed and well-nourished.  Neck: Neck supple. No thyromegaly present.  Cardiovascular: Normal rate and normal heart sounds.  Exam reveals no gallop.   No murmur heard. Pulmonary/Chest: Effort normal and breath sounds normal. No respiratory distress. She has no wheezes. She has no rales. She exhibits no tenderness.  Abdominal: Soft. Bowel sounds are normal. She exhibits no distension and no mass. There is no tenderness. There is no rebound and no guarding.  Musculoskeletal: She exhibits no edema.  Vitals reviewed.         Assessment & Plan:  Atrial fibrillation, unspecified - Plan: PT with INR/Fingerstick  Her INR is therapeutic. Continue Coumadin at present dose and recheck in 6 weeks

## 2015-07-17 ENCOUNTER — Encounter: Payer: Self-pay | Admitting: Family Medicine

## 2015-07-24 ENCOUNTER — Emergency Department (HOSPITAL_COMMUNITY)
Admission: EM | Admit: 2015-07-24 | Discharge: 2015-07-24 | Disposition: A | Discharge status: Home / Self Care | Payer: Medicare Other | Attending: Emergency Medicine | Admitting: Emergency Medicine

## 2015-07-24 ENCOUNTER — Encounter (HOSPITAL_COMMUNITY): Payer: Self-pay | Admitting: Emergency Medicine

## 2015-07-24 ENCOUNTER — Emergency Department (HOSPITAL_COMMUNITY): Payer: Medicare Other

## 2015-07-24 DIAGNOSIS — M810 Age-related osteoporosis without current pathological fracture: Secondary | ICD-10-CM | POA: Insufficient documentation

## 2015-07-24 DIAGNOSIS — H269 Unspecified cataract: Secondary | ICD-10-CM | POA: Insufficient documentation

## 2015-07-24 DIAGNOSIS — Z87891 Personal history of nicotine dependence: Secondary | ICD-10-CM | POA: Insufficient documentation

## 2015-07-24 DIAGNOSIS — Z79899 Other long term (current) drug therapy: Secondary | ICD-10-CM | POA: Insufficient documentation

## 2015-07-24 DIAGNOSIS — E669 Obesity, unspecified: Secondary | ICD-10-CM | POA: Diagnosis not present

## 2015-07-24 DIAGNOSIS — Z853 Personal history of malignant neoplasm of breast: Secondary | ICD-10-CM | POA: Insufficient documentation

## 2015-07-24 DIAGNOSIS — T1490 Injury, unspecified: Secondary | ICD-10-CM | POA: Diagnosis not present

## 2015-07-24 DIAGNOSIS — I4891 Unspecified atrial fibrillation: Secondary | ICD-10-CM | POA: Diagnosis not present

## 2015-07-24 DIAGNOSIS — E860 Dehydration: Secondary | ICD-10-CM | POA: Insufficient documentation

## 2015-07-24 DIAGNOSIS — M1990 Unspecified osteoarthritis, unspecified site: Secondary | ICD-10-CM | POA: Insufficient documentation

## 2015-07-24 DIAGNOSIS — Z8709 Personal history of other diseases of the respiratory system: Secondary | ICD-10-CM | POA: Diagnosis not present

## 2015-07-24 DIAGNOSIS — Z7901 Long term (current) use of anticoagulants: Secondary | ICD-10-CM | POA: Insufficient documentation

## 2015-07-24 DIAGNOSIS — Z8719 Personal history of other diseases of the digestive system: Secondary | ICD-10-CM | POA: Diagnosis not present

## 2015-07-24 DIAGNOSIS — E785 Hyperlipidemia, unspecified: Secondary | ICD-10-CM | POA: Diagnosis not present

## 2015-07-24 DIAGNOSIS — R42 Dizziness and giddiness: Secondary | ICD-10-CM | POA: Diagnosis not present

## 2015-07-24 DIAGNOSIS — R531 Weakness: Secondary | ICD-10-CM | POA: Diagnosis not present

## 2015-07-24 DIAGNOSIS — I10 Essential (primary) hypertension: Secondary | ICD-10-CM | POA: Insufficient documentation

## 2015-07-24 DIAGNOSIS — R404 Transient alteration of awareness: Secondary | ICD-10-CM | POA: Diagnosis not present

## 2015-07-24 LAB — BASIC METABOLIC PANEL
Anion gap: 12 (ref 5–15)
BUN: 14 mg/dL (ref 6–20)
CHLORIDE: 105 mmol/L (ref 101–111)
CO2: 24 mmol/L (ref 22–32)
CREATININE: 0.89 mg/dL (ref 0.44–1.00)
Calcium: 9.3 mg/dL (ref 8.9–10.3)
GFR calc non Af Amer: 57 mL/min — ABNORMAL LOW (ref >60)
Glucose, Bld: 137 mg/dL — ABNORMAL HIGH (ref 65–99)
POTASSIUM: 3.9 mmol/L (ref 3.5–5.1)
Sodium: 141 mmol/L (ref 135–145)

## 2015-07-24 LAB — CBC
HEMATOCRIT: 43.1 % (ref 36.0–46.0)
HEMOGLOBIN: 14.1 g/dL (ref 12.0–15.0)
MCH: 33.5 pg (ref 26.0–34.0)
MCHC: 32.7 g/dL (ref 30.0–36.0)
MCV: 102.4 fL — AB (ref 78.0–100.0)
PLATELETS: 176 10*3/uL (ref 150–400)
RBC: 4.21 MIL/uL (ref 3.87–5.11)
RDW: 13.4 % (ref 11.5–15.5)
WBC: 8.7 10*3/uL (ref 4.0–10.5)

## 2015-07-24 LAB — PROTIME-INR
INR: 2.1 — AB (ref 0.00–1.49)
Prothrombin Time: 23.4 seconds — ABNORMAL HIGH (ref 11.6–15.2)

## 2015-07-24 LAB — URINALYSIS, ROUTINE W REFLEX MICROSCOPIC
GLUCOSE, UA: NEGATIVE mg/dL
Ketones, ur: 15 mg/dL — AB
LEUKOCYTES UA: NEGATIVE
Nitrite: NEGATIVE
PH: 5.5 (ref 5.0–8.0)
Protein, ur: 30 mg/dL — AB
SPECIFIC GRAVITY, URINE: 1.035 — AB (ref 1.005–1.030)

## 2015-07-24 LAB — CBG MONITORING, ED: Glucose-Capillary: 123 mg/dL — ABNORMAL HIGH (ref 65–99)

## 2015-07-24 LAB — URINE MICROSCOPIC-ADD ON
BACTERIA UA: NONE SEEN
WBC UA: NONE SEEN WBC/hpf (ref 0–5)

## 2015-07-24 MED ORDER — SODIUM CHLORIDE 0.9 % IV BOLUS (SEPSIS)
1000.0000 mL | Freq: Once | INTRAVENOUS | Status: AC
  Administered 2015-07-24: 1000 mL via INTRAVENOUS

## 2015-07-24 MED ORDER — MECLIZINE HCL 25 MG PO TABS
12.5000 mg | ORAL_TABLET | Freq: Once | ORAL | Status: AC
  Administered 2015-07-24: 12.5 mg via ORAL
  Filled 2015-07-24: qty 1

## 2015-07-24 MED ORDER — MECLIZINE HCL 12.5 MG PO TABS: 12.5000 mg | ORAL_TABLET | Freq: Three times a day (TID) | ORAL | Status: DC | PRN

## 2015-07-24 NOTE — ED Notes (Signed)
Pt felt dizzy when about to sit down.

## 2015-07-24 NOTE — ED Notes (Signed)
Onset today woke up at 0800 and was slightly dizzy.  Bend over to plug in a heater increased dizziness and fall to floor. Hit right side of head on floor denies LOC denies pain from fall states has general arthritis pain all over. Alert answering and following commands appropriate.

## 2015-07-24 NOTE — ED Provider Notes (Signed)
CSN: [id number]     Arrival date & time 07/24/15  0941 History   First MD Initiated Contact with Patient 07/24/15 1009     Chief Complaint  Patient presents with  . Dizziness    (Consider location/radiation/quality/duration/timing/severity/associated sxs/prior Treatment) Patient is a 82 y.o. Kathy [name] presenting with dizziness. The history is provided by the patient. No language interpreter was used.  Dizziness Associated symptoms: no chest pain and no shortness of breath     Kathy Kathy [name] is an 82 y.o Kathy [name] with a history of diverticulosis, breast cancer, hyperlipidemia, hypertension, obesity, osteoporosis, and A. Fib (on Coumadin) who presents for dizziness when she woke up today while laying in bed. She reports that she felt that the blinds were moving. She was able to start her day and went to the bathroom, let the cat out, took 2 tylenol for her usual arthritis pain, and then sat down. He continued to feel dizzy and went to put a heater on but fell over due to dizziness. She denies hitting her head. She denies feeling off balance and states she does not know if the room was spinning. She uses a cane to walk with in the house. She states she had her INR checked on March 17 and it was 2.6. She admits that she normally stays in atrial fibrillation.  She denies any recent illness, change in medication, fever, chills, night sweats, headache, blurred vision, chest pain, palpitations, leg swelling, shortness of breath, abdominal pain, nausea, vomiting, diarrhea, or urinary symptoms.  Past Medical History  Diagnosis Date  . Hiatal hernia   . Diverticulitis   . PONV (postoperative nausea and vomiting)   . Shortness of breath     with exertion  . Arthritis     both shoulders  . Joint pain     both shoulders  . Hemorrhoids     internal  . Diverticulosis   . Urinary incontinence   . Nocturia   . Urinary frequency   . Sinus drainage   . Cataracts, bilateral   . Cancer ([place])     left breast   . Nasal congestion   . Breast cancer ([place])   . Hyperlipidemia     takes Zocor daily  . Hypertension     takes Amlodipine daily  . Obesity   . Macular degeneration 02/2003  . Osteoporosis   . Atrial fibrillation [facility])    Past Surgical History  Procedure Laterality Date  . Left breast biopsy  1984  . Right breast biopsy  1993  . Bilateral knee replacements  2008  . Hemorrhoid surgery      outpatient "done in doctor's office"  . Colonoscopy    . Esophagogastroduodenoscopy    . Breast biopsy  05/04/2011    Procedure: BREAST BIOPSY WITH NEEDLE LOCALIZATION;  Surgeon: Judieth Keens, DO;  Location: [place];  Service: General;  Laterality: Left;  . Joint replacement  2008    bilateral knee replacement  . Cardioversion N/A 09/15/2012    Procedure: CARDIOVERSION;  Surgeon: Laverda Page, MD;  Location: [facility] ENDOSCOPY;  Service: Cardiovascular;  Laterality: N/A;  h&p in file-HW   Family History  Problem Relation Age of Onset  . Cancer Mother     pancreatic  . Cancer Father     esophageal  . Cancer Sister     ovarian  . Cancer Sister     lung  . Cancer Brother     kidney  . Cancer Sister  breast cancer  . Anesthesia problems Neg Hx   . Hypotension Neg Hx   . Malignant hyperthermia Neg Hx   . Pseudochol deficiency Neg Hx   . Heart disease Brother   . COPD Brother   . Cancer Brother     Kidney/Nephrectomy   Social History  Substance Use Topics  . Smoking status: Former Smoker -- 0.25 packs/day for 10 years    Types: Cigarettes    Quit date: 05/21/1971  . Smokeless tobacco: Never Used     Comment: quit in the 60's  . Alcohol Use: No   OB History    No data available     Review of Systems  Constitutional: Negative for fever.  Respiratory: Negative for shortness of breath.   Cardiovascular: Negative for chest pain.  Neurological: Positive for dizziness.  All other systems reviewed and are negative.     Allergies  Review of patient's allergies indicates  no known allergies.  Home Medications   Prior to Admission medications   Medication Sig Start Date End Date Taking? Authorizing Provider  Calcium Carbonate-Vitamin D (CALTRATE 600+D) 600-400 MG-UNIT per tablet Take 1 tablet by mouth 2 (two) times daily.      Historical Provider, MD  cholecalciferol (VITAMIN D) 400 UNITS TABS Take 400 Units by mouth daily. Take in addition to Caltrate with Vitamin D to get 1200 units of Vitamin D    Historical Provider, MD  digoxin (LANOXIN) 0.125 MG tablet Take 0.125 mg by mouth daily. None on Sat or Sun    Historical Provider, MD  HYDROcodone-acetaminophen (NORCO) 5-325 MG tablet Take 1 tablet by mouth every 6 (six) hours as needed for moderate pain. 04/19/15   Susy Frizzle, MD  letrozole (FEMARA) 2.5 MG tablet TAKE 1 TABLET BY MOUTH DAILY. 01/07/15   Chauncey Cruel, MD  magnesium gluconate (MAGONATE) 500 MG tablet Take 500 mg by mouth 2 (two) times daily.    Historical Provider, MD  meclizine (ANTIVERT) 12.5 MG tablet Take 1 tablet (12.5 mg total) by mouth 3 (three) times daily as needed for dizziness. 07/24/15   Braxden Lovering Patel-Mills, PA-C  metoprolol tartrate (LOPRESSOR) 25 MG tablet Take 50 mg by mouth 2 (two) times daily. 07/12/12   Susy Frizzle, MD  simvastatin (ZOCOR) 20 MG tablet TAKE ONE TABLET BY MOUTH AT BEDTIME 08/13/14   Susy Frizzle, MD  valsartan-hydrochlorothiazide (DIOVAN-HCT) 320-25 MG per tablet TAKE ONE TABLET BY MOUTH ONCE DAILY 11/22/14   Susy Frizzle, MD  vitamin B-12 (CYANOCOBALAMIN) 1000 MCG tablet Take 1,000 mcg by mouth daily.    Historical Provider, MD  warfarin (COUMADIN) 5 MG tablet Take 2 tablets (10 mg total) by mouth daily. Patient taking differently: Take 5 mg by mouth daily. 2.5mg   tues & sat and 5 mg other days 11/22/14   Susy Frizzle, MD   BP 128/85 mmHg  Pulse 89  Temp(Src) 98.1 F (36.7 C) (Oral)  Resp 15  Ht 5\' 11"  (1.803 m)  Wt 79.833 kg  BMI 24.56 kg/m2  SpO2 97% Physical Exam  Constitutional: She  is oriented to person, place, and time. Vital signs are normal. She appears well-developed and well-nourished. No distress.  Well-appearing and in no acute distress. Vital signs stable.  HENT:  Head: Normocephalic and atraumatic.  Eyes: Conjunctivae are normal.  Neck: Normal range of motion. Neck supple.  Cardiovascular: Normal rate, normal heart sounds and intact distal pulses.  An irregularly irregular rhythm present.  Distal pulses intact. No murmur.  Pulmonary/Chest: Effort normal and breath sounds normal.  Clear to auscultation bilaterally.  Abdominal: Soft. There is no tenderness.  Soft and nontender.  Musculoskeletal: Normal range of motion.  Neurological: She is alert and oriented to person, place, and time.  Normal finger to nose bilaterally. GCS 15. Difficulty moving left shoulder secondary to arthritis. Cranial nerves III through XII intact. No sensory deficit.  Skin: Skin is warm and dry.  Nursing note and vitals reviewed.  Orthostatic VS for the past 24 hrs:  BP- Lying Pulse- Lying BP- Sitting Pulse- Sitting BP- Standing at 0 minutes Pulse- Standing at 0 minutes  07/24/15 1312 126/66 mmHg 82 135/69 mmHg 88 147/74 mmHg 106      ED Course  Procedures (including critical care time) Labs Review Labs Reviewed  BASIC METABOLIC PANEL - Abnormal; Notable for the following:    Glucose, Bld 137 (*)    GFR calc non Af Amer 57 (*)    All other components within normal limits  CBC - Abnormal; Notable for the following:    MCV 102.4 (*)    All other components within normal limits  URINALYSIS, ROUTINE W REFLEX MICROSCOPIC (NOT AT [facility]) - Abnormal; Notable for the following:    Color, Urine AMBER (*)    Specific Gravity, Urine 1.035 (*)    Hgb urine dipstick TRACE (*)    Bilirubin Urine SMALL (*)    Ketones, ur 15 (*)    Protein, ur 30 (*)    All other components within normal limits  PROTIME-INR - Abnormal; Notable for the following:    Prothrombin Time 23.4 (*)    INR  2.10 (*)    All other components within normal limits  URINE MICROSCOPIC-ADD ON - Abnormal; Notable for the following:    Squamous Epithelial / LPF 0-5 (*)    All other components within normal limits  CBG MONITORING, ED - Abnormal; Notable for the following:    Glucose-Capillary 123 (*)    All other components within normal limits    Imaging Review Dg Chest 2 View  07/24/2015  CLINICAL DATA:  Dizziness beginning this morning. Initial encounter. EXAM: CHEST  2 VIEW COMPARISON:  PA and lateral chest 04/30/2011. FINDINGS: The lungs are clear. Heart size is normal. No pneumothorax or pleural effusion. No focal bony abnormality. Multilevel thoracic and lumbar spondylosis and severe degenerative disease about the shoulders is noted. IMPRESSION: No acute disease. Electronically Signed   By: Inge Rise M.D.   On: 07/24/2015 12:05   Ct Head Wo Contrast  07/24/2015  CLINICAL DATA:  Dizziness, right-sided weakness. EXAM: CT HEAD WITHOUT CONTRAST TECHNIQUE: Contiguous axial images were obtained from the base of the skull through the vertex without intravenous contrast. COMPARISON:  None. FINDINGS: Bony calvarium appears intact. Mild diffuse cortical atrophy is noted. Mild chronic ischemic white matter disease is noted. No mass effect or midline shift is noted. Ventricular size is within normal limits. There is no evidence of mass lesion, hemorrhage or acute infarction. IMPRESSION: Mild diffuse cortical atrophy. Mild chronic ischemic white matter disease. No acute intracranial abnormality seen. Electronically Signed   By: Marijo Conception, M.D.   On: 07/24/2015 12:51   I have personally reviewed and evaluated these images and lab results as part of my medical decision-making.   EKG Interpretation   Date/Time:  Wednesday July 24 2015 09:41:32 EDT Ventricular Rate:  81 PR Interval:    QRS Duration: 100 QT Interval:  385 QTC Calculation: 447 R Axis:  25 Text Interpretation:  Atrial fibrillation  Low voltage, precordial leads  Borderline repolarization abnormality Abnormal ekg Confirmed by BEATON   MD, ROBERT ([id number]) on 07/24/2015 11:19:54 AM      MDM   Final diagnoses:  Dizziness  Dehydration   Patient presents for dizziness that began this morning. He denies any other symptoms. She is currently in A. fib. Vital signs stable. Her neuro exam is normal. EKG shows atrial fibrillation. According to the patient's primary physician notes she has chronic atrial fibrillation. Currently on Coumadin which is therapeutic. Her analysis shows high specific gravity which could indicate dehydration. Otherwise, her labs are completely normal. Head CT was obtained due to poor history of details of dizziness. CT scan was negative for acute bleed. Chest x-ray was negative for pneumonia. I discussed findings with the patient. She has no chest pain, shortness of breath, palpitations, lightheadedness. She was orthostatic prior to being given fluids. She was also given meclizine. I discussed following up with her doctor within 48 hours. Return precautions were discussed. Patient agrees with plan. Patient was discussed with Dr. Audie Pinto who agrees with plan. Medications  sodium chloride 0.9 % bolus 1,000 mL (0 mLs Intravenous Stopped 07/24/15 1400)  meclizine (ANTIVERT) tablet 12.5 mg (12.5 mg Oral Given 07/24/15 1400)   Filed Vitals:   07/24/15 1315 07/24/15 1345  BP: 147/74 128/85  Pulse: 93 89  Temp:    Resp: [address] PA-C 07/24/15 [place], MD 07/25/15 2133

## 2015-07-24 NOTE — ED Notes (Signed)
EKG completed given to EDP.  

## 2015-07-24 NOTE — Discharge Instructions (Signed)
Dehydration, Adult Turn for chest pain, palpitations, shortness of breath, lightheadedness. Stay well-hydrated. Follow up with your doctor within the next 2 days. Dehydration is a condition in which you do not have enough fluid or water in your body. It happens when you take in less fluid than you lose. Vital organs such as the kidneys, brain, and heart cannot function without a proper amount of fluids. Any loss of fluids from the body can cause dehydration.  Dehydration can range from mild to severe. This condition should be treated right away to help prevent it from becoming severe. CAUSES  This condition may be caused by:  Vomiting.  Diarrhea.  Excessive sweating, such as when exercising in hot or humid weather.  Not drinking enough fluid during strenuous exercise or during an illness.  Excessive urine output.  Fever.  Certain medicines. RISK FACTORS This condition is more likely to develop in:  People who are taking certain medicines that cause the body to lose excess fluid (diuretics).   People who have a chronic illness, such as diabetes, that may increase urination.  Older adults.   People who live at high altitudes.   People who participate in endurance sports.  SYMPTOMS  Mild Dehydration  Thirst.  Dry lips.  Slightly dry mouth.  Dry, warm skin. Moderate Dehydration  Very dry mouth.   Muscle cramps.   Dark urine and decreased urine production.   Decreased tear production.   Headache.   Light-headedness, especially when you stand up from a sitting position.  Severe Dehydration  Changes in skin.   Cold and clammy skin.   Skin does not spring back quickly when lightly pinched and released.   Changes in body fluids.   Extreme thirst.   No tears.   Not able to sweat when body temperature is high, such as in hot weather.   Minimal urine production.   Changes in vital signs.   Rapid, weak pulse (more than 100 beats per  minute when you are sitting still).   Rapid breathing.   Low blood pressure.   Other changes.   Sunken eyes.   Cold hands and feet.   Confusion.  Lethargy and difficulty being awakened.  Fainting (syncope).   Short-term weight loss.   Unconsciousness. DIAGNOSIS  This condition may be diagnosed based on your symptoms. You may also have tests to determine how severe your dehydration is. These tests may include:   Urine tests.   Blood tests.  TREATMENT  Treatment for this condition depends on the severity. Mild or moderate dehydration can often be treated at home. Treatment should be started right away. Do not wait until dehydration becomes severe. Severe dehydration needs to be treated at the hospital. Treatment for Mild Dehydration  Drinking plenty of water to replace the fluid you have lost.   Replacing minerals in your blood (electrolytes) that you may have lost.  Treatment for Moderate Dehydration  Consuming oral rehydration solution (ORS). Treatment for Severe Dehydration  Receiving fluid through an IV tube.   Receiving electrolyte solution through a feeding tube that is passed through your nose and into your stomach (nasogastric tube or NG tube).  Correcting any abnormalities in electrolytes. HOME CARE INSTRUCTIONS   Drink enough fluid to keep your urine clear or pale yellow.   Drink water or fluid slowly by taking small sips. You can also try sucking on ice cubes.  Have food or beverages that contain electrolytes. Examples include bananas and sports drinks.  Take over-the-counter and  prescription medicines only as told by your health care provider.   Prepare ORS according to the manufacturer's instructions. Take sips of ORS every 5 minutes until your urine returns to normal.  If you have vomiting or diarrhea, continue to try to drink water, ORS, or both.   If you have diarrhea, avoid:   Beverages that contain caffeine.   Fruit  juice.   Milk.   Carbonated soft drinks.  Do not take salt tablets. This can lead to the condition of having too much sodium in your body (hypernatremia).  SEEK MEDICAL CARE IF:  You cannot eat or drink without vomiting.  You have had moderate diarrhea during a period of more than 24 hours.  You have a fever. SEEK IMMEDIATE MEDICAL CARE IF:   You have extreme thirst.  You have severe diarrhea.  You have not urinated in 6-8 hours, or you have urinated only a small amount of very dark urine.  You have shriveled skin.  You are dizzy, confused, or both.   This information is not intended to replace advice given to you by your health care provider. Make sure you discuss any questions you have with your health care provider.   Document Released: 04/13/2005 Document Revised: 01/02/2015 Document Reviewed: 08/29/2014 Elsevier Interactive Patient Education [employer].

## 2015-07-26 ENCOUNTER — Encounter: Payer: Self-pay | Admitting: Family Medicine

## 2015-07-26 ENCOUNTER — Ambulatory Visit (INDEPENDENT_AMBULATORY_CARE_PROVIDER_SITE_OTHER): Payer: Medicare Other | Admitting: Family Medicine

## 2015-07-26 VITALS — BP 140/82 | HR 72 | Temp 98.1°F | Resp 18 | Ht 60.0 in | Wt 178.0 lb

## 2015-07-26 DIAGNOSIS — H8110 Benign paroxysmal vertigo, unspecified ear: Secondary | ICD-10-CM | POA: Diagnosis not present

## 2015-07-26 NOTE — Progress Notes (Signed)
Subjective:    Patient ID: Kathy [name], female    DOB: 1928/01/09, 82 y.o.   MRN: [id number]  HPI Recently was taken to the emergency room after she fell. That morning, she rolled over in bed and she states that the blinds on her window in the room seemed to move back and forth like mild vertigo. She states that when she got up out of bed, she felt very dizzy. When she bent forward, she fell down. Her family took her to the emergency room. Their lab work was normal. A CT scan of the head revealed chronic white matter disease and also cortical atrophy but no acute abnormality. She is here today for follow-up. She states that since leaving the emergency room she has had 2 other episodes where she will either roll over in bed and the room will seem to spin or she will turn her head quickly and the room will seem to spend. This causes her to feel dizzy and out of balance. She denies any syncope or presyncope. She denies any chest pain palpitations or dyspnea on exertion. Dix-Hallpike maneuver today is positive in that it re-creates the dizziness although no nystagmus was seen Past Medical History  Diagnosis Date  . Hiatal hernia   . Diverticulitis   . PONV (postoperative nausea and vomiting)   . Shortness of breath     with exertion  . Arthritis     both shoulders  . Joint pain     both shoulders  . Hemorrhoids     internal  . Diverticulosis   . Urinary incontinence   . Nocturia   . Urinary frequency   . Sinus drainage   . Cataracts, bilateral   . Cancer ([place])     left breast  . Nasal congestion   . Breast cancer ([place])   . Hyperlipidemia     takes Zocor daily  . Hypertension     takes Amlodipine daily  . Obesity   . Macular degeneration 02/2003  . Osteoporosis   . Atrial fibrillation [facility])    Past Surgical History  Procedure Laterality Date  . Left breast biopsy  1984  . Right breast biopsy  1993  . Bilateral knee replacements  2008  . Hemorrhoid surgery      outpatient  "done in doctor's office"  . Colonoscopy    . Esophagogastroduodenoscopy    . Breast biopsy  05/04/2011    Procedure: BREAST BIOPSY WITH NEEDLE LOCALIZATION;  Surgeon: Judieth Keens, DO;  Location: [place];  Service: General;  Laterality: Left;  . Joint replacement  2008    bilateral knee replacement  . Cardioversion N/A 09/15/2012    Procedure: CARDIOVERSION;  Surgeon: Laverda Page, MD;  Location: [place];  Service: Cardiovascular;  Laterality: N/A;  h&p in file-HW   Current Outpatient Prescriptions on File Prior to Visit  Medication Sig Dispense Refill  . Calcium Carbonate-Vitamin D (CALTRATE 600+D) 600-400 MG-UNIT per tablet Take 1 tablet by mouth 2 (two) times daily.      . cholecalciferol (VITAMIN D) 400 UNITS TABS Take 400 Units by mouth daily. Take in addition to Caltrate with Vitamin D to get 1200 units of Vitamin D    . digoxin (LANOXIN) 0.125 MG tablet Take 0.125 mg by mouth daily. None on Sat or Sun    . HYDROcodone-acetaminophen (NORCO) 5-325 MG tablet Take 1 tablet by mouth every 6 (six) hours as needed for moderate pain. 30 tablet 0  . letrozole [facility])  2.5 MG tablet TAKE 1 TABLET BY MOUTH DAILY. 90 tablet 3  . magnesium gluconate (MAGONATE) 500 MG tablet Take 500 mg by mouth 2 (two) times daily.    . meclizine (ANTIVERT) 12.5 MG tablet Take 1 tablet (12.5 mg total) by mouth 3 (three) times daily as needed for dizziness. 30 tablet 0  . metoprolol tartrate (LOPRESSOR) 25 MG tablet Take 50 mg by mouth 2 (two) times daily.    . simvastatin (ZOCOR) 20 MG tablet TAKE ONE TABLET BY MOUTH AT BEDTIME 30 tablet 11  . valsartan-hydrochlorothiazide (DIOVAN-HCT) 320-25 MG per tablet TAKE ONE TABLET BY MOUTH ONCE DAILY 30 tablet 5  . vitamin B-12 (CYANOCOBALAMIN) 1000 MCG tablet Take 1,000 mcg by mouth daily.    Marland Kitchen warfarin (COUMADIN) 5 MG tablet Take 2 tablets (10 mg total) by mouth daily. (Patient taking differently: Take 5 mg by mouth daily. 2.5mg   tues & sat and 5 mg other days)  60 tablet 2   No current facility-administered medications on file prior to visit.   No Known Allergies Social History   Social History  . Marital Status: Widowed    Spouse Name: N/A  . Number of Children: N/A  . Years of Education: N/A   Occupational History  . retired    Social History Main Topics  . Smoking status: Former Smoker -- 0.25 packs/day for 10 years    Types: Cigarettes    Quit date: 05/21/1971  . Smokeless tobacco: Never Used     Comment: quit in the 60's  . Alcohol Use: No  . Drug Use: No  . Sexual Activity: Not Currently   Other Topics Concern  . Not on file   Social History Narrative      Review of Systems  All other systems reviewed and are negative.      Objective:   Physical Exam  Constitutional: She is oriented to person, place, and time. She appears well-developed. No distress.  Cardiovascular: Normal rate and normal heart sounds.  An irregularly irregular rhythm present.  Pulmonary/Chest: Effort normal and breath sounds normal. No respiratory distress. She has no wheezes. She has no rales.  Abdominal: Soft. Bowel sounds are normal.  Neurological: She is alert and oriented to person, place, and time. She has normal reflexes. She displays normal reflexes. No cranial nerve deficit. She exhibits normal muscle tone. Coordination normal.  Vitals reviewed.         Assessment & Plan:  BPPV (benign paroxysmal positional vertigo), unspecified laterality  Patient already has poor balance and walks with a cane. I believe she experienced benign paroxysmal positional vertigo which then compounded her baseline disequilibrium and caused her to fall. She's been taking meclizine 12.5 mg every 8 hours as needed for vertigo which seems to help. We discussed the natural history of vertigo. I recommended caution taking meclizine as this can make her sleepy and confused. Her symptoms seem to be improving over the last week. I recommended tincture of time. If  she continues to fall we may need to rethink whether Coumadin is the right medication for this patient.

## 2015-07-30 DIAGNOSIS — H2511 Age-related nuclear cataract, right eye: Secondary | ICD-10-CM | POA: Diagnosis not present

## 2015-07-30 DIAGNOSIS — Z961 Presence of intraocular lens: Secondary | ICD-10-CM | POA: Diagnosis not present

## 2015-07-31 DIAGNOSIS — H2512 Age-related nuclear cataract, left eye: Secondary | ICD-10-CM | POA: Diagnosis not present

## 2015-07-31 DIAGNOSIS — H25042 Posterior subcapsular polar age-related cataract, left eye: Secondary | ICD-10-CM | POA: Diagnosis not present

## 2015-07-31 DIAGNOSIS — H25012 Cortical age-related cataract, left eye: Secondary | ICD-10-CM | POA: Diagnosis not present

## 2015-08-05 ENCOUNTER — Other Ambulatory Visit: Payer: Self-pay | Admitting: Family Medicine

## 2015-08-05 NOTE — Telephone Encounter (Signed)
Refill appropriate and filled per protocol. 

## 2015-08-06 DIAGNOSIS — H2512 Age-related nuclear cataract, left eye: Secondary | ICD-10-CM | POA: Diagnosis not present

## 2015-08-06 DIAGNOSIS — Z961 Presence of intraocular lens: Secondary | ICD-10-CM | POA: Diagnosis not present

## 2015-08-17 ENCOUNTER — Other Ambulatory Visit: Payer: Self-pay | Admitting: Family Medicine

## 2015-08-23 ENCOUNTER — Ambulatory Visit (INDEPENDENT_AMBULATORY_CARE_PROVIDER_SITE_OTHER): Payer: Medicare Other | Admitting: Family Medicine

## 2015-08-23 ENCOUNTER — Encounter: Payer: Self-pay | Admitting: Family Medicine

## 2015-08-23 DIAGNOSIS — I4891 Unspecified atrial fibrillation: Secondary | ICD-10-CM | POA: Diagnosis not present

## 2015-08-23 NOTE — Progress Notes (Signed)
Subjective:    Patient ID: Kathy [name], female    DOB: [birth year], 82 y.o.   MRN: [id number]  HPI 07/26/15 Recently was taken to the emergency room after she fell. That morning, she rolled over in bed and she states that the blinds on her window in the room seemed to move back and forth like mild vertigo. She states that when she got up out of bed, she felt very dizzy. When she bent forward, she fell down. Her family took her to the emergency room. Their lab work was normal. A CT scan of the head revealed chronic white matter disease and also cortical atrophy but no acute abnormality. She is here today for follow-up. She states that since leaving the emergency room she has had 2 other episodes where she will either roll over in bed and the room will seem to spin or she will turn her head quickly and the room will seem to spend. This causes her to feel dizzy and out of balance. She denies any syncope or presyncope. She denies any chest pain palpitations or dyspnea on exertion. Dix-Hallpike maneuver today is positive in that it re-creates the dizziness although no nystagmus was seen.   At that time, my plan was: Patient already has poor balance and walks with a cane. I believe she experienced benign paroxysmal positional vertigo which then compounded her baseline disequilibrium and caused her to fall. She's been taking meclizine 12.5 mg every 8 hours as needed for vertigo which seems to help. We discussed the natural history of vertigo. I recommended caution taking meclizine as this can make her sleepy and confused. Her symptoms seem to be improving over the last week. I recommended tincture of time. If she continues to fall we may need to rethink whether Coumadin is the right medication for this patient.  08/23/15 Patient is here today for recheck of her INR. Her INR is 2.6 and therapeutic. She denies any bleeding or bruising. Her vertigo has resolved and she has not fallen since the last time I saw  her. Past Medical History  Diagnosis Date  . Hiatal hernia   . Diverticulitis   . PONV (postoperative nausea and vomiting)   . Shortness of breath     with exertion  . Arthritis     both shoulders  . Joint pain     both shoulders  . Hemorrhoids     internal  . Diverticulosis   . Urinary incontinence   . Nocturia   . Urinary frequency   . Sinus drainage   . Cataracts, bilateral   . Cancer ([place])     left breast  . Nasal congestion   . Breast cancer ([place])   . Hyperlipidemia     takes Zocor daily  . Hypertension     takes Amlodipine daily  . Obesity   . Macular degeneration 02/2003  . Osteoporosis   . Atrial fibrillation [facility])    Past Surgical History  Procedure Laterality Date  . Left breast biopsy  1984  . Right breast biopsy  1993  . Bilateral knee replacements  2008  . Hemorrhoid surgery      outpatient "done in doctor's office"  . Colonoscopy    . Esophagogastroduodenoscopy    . Breast biopsy  05/04/2011    Procedure: BREAST BIOPSY WITH NEEDLE LOCALIZATION;  Surgeon: Judieth Keens, DO;  Location: [place];  Service: General;  Laterality: Left;  . Joint replacement  2008    bilateral knee replacement  .  Cardioversion N/A 09/15/2012    Procedure: CARDIOVERSION;  Surgeon: Laverda Page, MD;  Location: [place];  Service: Cardiovascular;  Laterality: N/A;  h&p in file-HW   Current Outpatient Prescriptions on File Prior to Visit  Medication Sig Dispense Refill  . Calcium Carbonate-Vitamin D (CALTRATE 600+D) 600-400 MG-UNIT per tablet Take 1 tablet by mouth 2 (two) times daily.      . cholecalciferol (VITAMIN D) 400 UNITS TABS Take 400 Units by mouth daily. Take in addition to Caltrate with Vitamin D to get 1200 units of Vitamin D    . digoxin (LANOXIN) 0.125 MG tablet Take 0.125 mg by mouth daily. None on Sat or Sun    . HYDROcodone-acetaminophen (NORCO) 5-325 MG tablet Take 1 tablet by mouth every 6 (six) hours as needed for moderate pain. 30 tablet 0  .  letrozole (FEMARA) 2.5 MG tablet TAKE 1 TABLET BY MOUTH DAILY. 90 tablet 3  . magnesium gluconate (MAGONATE) 500 MG tablet Take 500 mg by mouth 2 (two) times daily.    . meclizine (ANTIVERT) 12.5 MG tablet Take 1 tablet (12.5 mg total) by mouth 3 (three) times daily as needed for dizziness. 30 tablet 0  . metoprolol tartrate (LOPRESSOR) 25 MG tablet Take 50 mg by mouth 2 (two) times daily.    . simvastatin (ZOCOR) 20 MG tablet TAKE ONE TABLET BY MOUTH AT BEDTIME 30 tablet 0  . valsartan-hydrochlorothiazide (DIOVAN-HCT) 320-25 MG per tablet TAKE ONE TABLET BY MOUTH ONCE DAILY 30 tablet 5  . vitamin B-12 (CYANOCOBALAMIN) 1000 MCG tablet Take 1,000 mcg by mouth daily.    Marland Kitchen warfarin (COUMADIN) 5 MG tablet Take 2 tablets (10 mg total) by mouth daily. (Patient taking differently: Take 10 mg by mouth daily. Coumain 2.5 mg Tuesday & Saturday and 5 mg other days) 60 tablet 2   No current facility-administered medications on file prior to visit.   No Known Allergies Social History   Social History  . Marital Status: Widowed    Spouse Name: N/A  . Number of Children: N/A  . Years of Education: N/A   Occupational History  . retired    Social History Main Topics  . Smoking status: Former Smoker -- 0.25 packs/day for 10 years    Types: Cigarettes    Quit date: 05/21/1971  . Smokeless tobacco: Never Used     Comment: quit in the 60's  . Alcohol Use: No  . Drug Use: No  . Sexual Activity: Not Currently   Other Topics Concern  . Not on file   Social History Narrative      Review of Systems  All other systems reviewed and are negative.      Objective:   Physical Exam  Constitutional: She is oriented to person, place, and time. She appears well-developed. No distress.  Cardiovascular: Normal rate and normal heart sounds.  An irregularly irregular rhythm present.  Pulmonary/Chest: Effort normal and breath sounds normal. No respiratory distress. She has no wheezes. She has no rales.    Abdominal: Soft. Bowel sounds are normal.  Neurological: She is alert and oriented to person, place, and time. She has normal reflexes. No cranial nerve deficit. She exhibits normal muscle tone. Coordination normal.  Vitals reviewed.         Assessment & Plan:  Atrial fibrillation, unspecified - Plan: PT with INR/Fingerstick  Continue Coumadin at its present dose and recheck an INR in 6 weeks

## 2015-08-26 LAB — PT WITH INR/FINGERSTICK
INR FINGERSTICK: 2.5 — AB (ref 0.80–1.20)
PT, fingerstick: 29.8 seconds — ABNORMAL HIGH (ref 10.4–12.5)

## 2015-08-27 DIAGNOSIS — Z09 Encounter for follow-up examination after completed treatment for conditions other than malignant neoplasm: Secondary | ICD-10-CM | POA: Diagnosis not present

## 2015-09-04 ENCOUNTER — Other Ambulatory Visit: Payer: Self-pay | Admitting: Family Medicine

## 2015-09-04 NOTE — Telephone Encounter (Signed)
Refill appropriate and filled per protocol. 

## 2015-10-04 ENCOUNTER — Ambulatory Visit (INDEPENDENT_AMBULATORY_CARE_PROVIDER_SITE_OTHER): Payer: Medicare Other | Admitting: Family Medicine

## 2015-10-04 ENCOUNTER — Encounter: Payer: Self-pay | Admitting: Family Medicine

## 2015-10-04 VITALS — BP 124/82 | HR 76 | Temp 97.5°F | Resp 16 | Ht 60.0 in | Wt 178.0 lb

## 2015-10-04 DIAGNOSIS — I4891 Unspecified atrial fibrillation: Secondary | ICD-10-CM

## 2015-10-04 LAB — PT WITH INR/FINGERSTICK
INR FINGERSTICK: 3 — AB (ref 0.80–1.20)
PT, fingerstick: 35.5 seconds — ABNORMAL HIGH (ref 10.4–12.5)

## 2015-10-04 NOTE — Progress Notes (Signed)
Subjective:    Patient ID: Kathy [name], female    DOB: [birth year], 82 y.o.   MRN: [id number]  HPI  Patient is here today for recheck of her INR. Her INR is 3.0 and therapeutic. She denies any bleeding or bruising.  Past Medical History  Diagnosis Date  . Hiatal hernia   . Diverticulitis   . PONV (postoperative nausea and vomiting)   . Shortness of breath     with exertion  . Arthritis     both shoulders  . Joint pain     both shoulders  . Hemorrhoids     internal  . Diverticulosis   . Urinary incontinence   . Nocturia   . Urinary frequency   . Sinus drainage   . Cataracts, bilateral   . Cancer ([place])     left breast  . Nasal congestion   . Breast cancer ([place])   . Hyperlipidemia     takes Zocor daily  . Hypertension     takes Amlodipine daily  . Obesity   . Macular degeneration 02/2003  . Osteoporosis   . Atrial fibrillation [facility])    Past Surgical History  Procedure Laterality Date  . Left breast biopsy  1984  . Right breast biopsy  1993  . Bilateral knee replacements  2008  . Hemorrhoid surgery      outpatient "done in doctor's office"  . Colonoscopy    . Esophagogastroduodenoscopy    . Breast biopsy  05/04/2011    Procedure: BREAST BIOPSY WITH NEEDLE LOCALIZATION;  Surgeon: Judieth Keens, DO;  Location: [place];  Service: General;  Laterality: Left;  . Joint replacement  2008    bilateral knee replacement  . Cardioversion N/A 09/15/2012    Procedure: CARDIOVERSION;  Surgeon: Laverda Page, MD;  Location: [place];  Service: Cardiovascular;  Laterality: N/A;  h&p in file-HW   Current Outpatient Prescriptions on File Prior to Visit  Medication Sig Dispense Refill  . Calcium Carbonate-Vitamin D (CALTRATE 600+D) 600-400 MG-UNIT per tablet Take 1 tablet by mouth 2 (two) times daily.      . cholecalciferol (VITAMIN D) 400 UNITS TABS Take 400 Units by mouth daily. Take in addition to Caltrate with Vitamin D to get 1200 units of Vitamin D    . digoxin  (LANOXIN) 0.125 MG tablet Take 0.125 mg by mouth daily. None on Sat or Sun    . HYDROcodone-acetaminophen (NORCO) 5-325 MG tablet Take 1 tablet by mouth every 6 (six) hours as needed for moderate pain. 30 tablet 0  . letrozole (FEMARA) 2.5 MG tablet TAKE 1 TABLET BY MOUTH DAILY. 90 tablet 3  . magnesium gluconate (MAGONATE) 500 MG tablet Take 500 mg by mouth 2 (two) times daily.    . metoprolol tartrate (LOPRESSOR) 25 MG tablet Take 50 mg by mouth 2 (two) times daily.    . simvastatin (ZOCOR) 20 MG tablet TAKE ONE TABLET BY MOUTH AT BEDTIME 30 tablet 3  . valsartan-hydrochlorothiazide (DIOVAN-HCT) 320-25 MG per tablet TAKE ONE TABLET BY MOUTH ONCE DAILY 30 tablet 5  . vitamin B-12 (CYANOCOBALAMIN) 1000 MCG tablet Take 1,000 mcg by mouth daily.    Marland Kitchen warfarin (COUMADIN) 5 MG tablet Take 2 tablets (10 mg total) by mouth daily. (Patient taking differently: Take 10 mg by mouth daily. Coumain 2.5 mg Tuesday & Saturday and 5 mg other days) 60 tablet 2   No current facility-administered medications on file prior to visit.   No Known Allergies Social History  Social History  . Marital Status: Widowed    Spouse Name: N/A  . Number of Children: N/A  . Years of Education: N/A   Occupational History  . retired    Social History Main Topics  . Smoking status: Former Smoker -- 0.25 packs/day for 10 years    Types: Cigarettes    Quit date: 05/21/1971  . Smokeless tobacco: Never Used     Comment: quit in the 60's  . Alcohol Use: No  . Drug Use: No  . Sexual Activity: Not Currently   Other Topics Concern  . Not on file   Social History Narrative      Review of Systems  All other systems reviewed and are negative.      Objective:   Physical Exam  Constitutional: She is oriented to person, place, and time. She appears well-developed. No distress.  Cardiovascular: Normal rate and normal heart sounds.  An irregularly irregular rhythm present.  Pulmonary/Chest: Effort normal and breath  sounds normal. No respiratory distress. She has no wheezes. She has no rales.  Abdominal: Soft. Bowel sounds are normal.  Neurological: She is alert and oriented to person, place, and time. She has normal reflexes. No cranial nerve deficit. She exhibits normal muscle tone. Coordination normal.  Vitals reviewed.         Assessment & Plan:  Atrial fibrillation, unspecified - Plan: PT with INR/Fingerstick  Continue Coumadin at its present dose and recheck an INR in 6 weeks

## 2015-11-15 ENCOUNTER — Encounter: Payer: Self-pay | Admitting: Family Medicine

## 2015-11-15 ENCOUNTER — Ambulatory Visit (INDEPENDENT_AMBULATORY_CARE_PROVIDER_SITE_OTHER): Payer: Medicare Other | Admitting: Family Medicine

## 2015-11-15 VITALS — BP 112/74 | HR 76 | Temp 98.2°F | Resp 16 | Ht 60.0 in | Wt 178.0 lb

## 2015-11-15 DIAGNOSIS — I4891 Unspecified atrial fibrillation: Secondary | ICD-10-CM | POA: Diagnosis not present

## 2015-11-15 DIAGNOSIS — M19019 Primary osteoarthritis, unspecified shoulder: Secondary | ICD-10-CM

## 2015-11-15 LAB — PT WITH INR/FINGERSTICK
INR FINGERSTICK: 3 — AB (ref 0.80–1.20)
PT FINGERSTICK: 35.4 s — AB (ref 10.4–12.5)

## 2015-11-15 MED ORDER — HYDROCODONE-ACETAMINOPHEN 5-325 MG PO TABS: 1.0000 | ORAL_TABLET | Freq: Four times a day (QID) | ORAL | Status: DC | PRN

## 2015-11-15 NOTE — Progress Notes (Signed)
Subjective:    Patient ID: Kathy [name], female    DOB: 1927-07-21, 82 y.o.   MRN: [id number]  HPI  Patient is here today for recheck of her INR. Her INR is 3.0 and therapeutic. She denies any bleeding or bruising.   Liver she is starting to develop pain in her left shoulder again. She is asking for refill on the oxycodone that she takes sparingly for this on early when the pain is severe and keeps her from sleeping. The last time I performed a cortisone injection in her shoulder was in December. It provided relief temporarily. She may be willing to consider this again if the pain worsens Past Medical History  Diagnosis Date  . Hiatal hernia   . Diverticulitis   . PONV (postoperative nausea and vomiting)   . Shortness of breath     with exertion  . Arthritis     both shoulders  . Joint pain     both shoulders  . Hemorrhoids     internal  . Diverticulosis   . Urinary incontinence   . Nocturia   . Urinary frequency   . Sinus drainage   . Cataracts, bilateral   . Cancer ([place])     left breast  . Nasal congestion   . Breast cancer ([place])   . Hyperlipidemia     takes Zocor daily  . Hypertension     takes Amlodipine daily  . Obesity   . Macular degeneration 02/2003  . Osteoporosis   . Atrial fibrillation [facility])    Past Surgical History  Procedure Laterality Date  . Left breast biopsy  1984  . Right breast biopsy  1993  . Bilateral knee replacements  2008  . Hemorrhoid surgery      outpatient "done in doctor's office"  . Colonoscopy    . Esophagogastroduodenoscopy    . Breast biopsy  05/04/2011    Procedure: BREAST BIOPSY WITH NEEDLE LOCALIZATION;  Surgeon: Judieth Keens, DO;  Location: [place];  Service: General;  Laterality: Left;  . Joint replacement  2008    bilateral knee replacement  . Cardioversion N/A 09/15/2012    Procedure: CARDIOVERSION;  Surgeon: Laverda Page, MD;  Location: [place];  Service: Cardiovascular;  Laterality: N/A;  h&p in file-HW    Current Outpatient Prescriptions on File Prior to Visit  Medication Sig Dispense Refill  . Calcium Carbonate-Vitamin D (CALTRATE 600+D) 600-400 MG-UNIT per tablet Take 1 tablet by mouth 2 (two) times daily.      . cholecalciferol (VITAMIN D) 400 UNITS TABS Take 400 Units by mouth daily. Take in addition to Caltrate with Vitamin D to get 1200 units of Vitamin D    . digoxin (LANOXIN) 0.125 MG tablet Take 0.125 mg by mouth daily. None on Sat or Sun    . HYDROcodone-acetaminophen (NORCO) 5-325 MG tablet Take 1 tablet by mouth every 6 (six) hours as needed for moderate pain. 30 tablet 0  . letrozole (FEMARA) 2.5 MG tablet TAKE 1 TABLET BY MOUTH DAILY. 90 tablet 3  . magnesium gluconate (MAGONATE) 500 MG tablet Take 500 mg by mouth 2 (two) times daily.    . metoprolol tartrate (LOPRESSOR) 25 MG tablet Take 50 mg by mouth 2 (two) times daily.    . simvastatin (ZOCOR) 20 MG tablet TAKE ONE TABLET BY MOUTH AT BEDTIME 30 tablet 3  . valsartan-hydrochlorothiazide (DIOVAN-HCT) 320-25 MG per tablet TAKE ONE TABLET BY MOUTH ONCE DAILY 30 tablet 5  . vitamin B-12 (CYANOCOBALAMIN)  1000 MCG tablet Take 1,000 mcg by mouth daily.    Marland Kitchen warfarin (COUMADIN) 5 MG tablet Take 2 tablets (10 mg total) by mouth daily. (Patient taking differently: Take 10 mg by mouth daily. Coumain 2.5 mg Tuesday & Saturday and 5 mg other days) 60 tablet 2   No current facility-administered medications on file prior to visit.   No Known Allergies Social History   Social History  . Marital Status: Widowed    Spouse Name: N/A  . Number of Children: N/A  . Years of Education: N/A   Occupational History  . retired    Social History Main Topics  . Smoking status: Former Smoker -- 0.25 packs/day for 10 years    Types: Cigarettes    Quit date: 05/21/1971  . Smokeless tobacco: Never Used     Comment: quit in the 60's  . Alcohol Use: No  . Drug Use: No  . Sexual Activity: Not Currently   Other Topics Concern  . Not on file    Social History Narrative      Review of Systems  All other systems reviewed and are negative.      Objective:   Physical Exam  Constitutional: She is oriented to person, place, and time. She appears well-developed. No distress.  Cardiovascular: Normal rate and normal heart sounds.  An irregularly irregular rhythm present.  Pulmonary/Chest: Effort normal and breath sounds normal. No respiratory distress. She has no wheezes. She has no rales.  Abdominal: Soft. Bowel sounds are normal.  Neurological: She is alert and oriented to person, place, and time. She has normal reflexes. No cranial nerve deficit. She exhibits normal muscle tone. Coordination normal.  Vitals reviewed.         Assessment & Plan:  Osteoarthritis of shoulder, unspecified laterality, unspecified osteoarthritis type - Plan: HYDROcodone-acetaminophen (NORCO) 5-325 MG tablet  Atrial fibrillation, unspecified - Plan: PT with INR/Fingerstick   Continue Coumadin at its present dose and recheck an INR in 6 weeks.  I refilled the patient's Percocet and gave her 30 tablets. Recheck in 6 weeks. If shoulder worsens, proceed with a cortisone injection in the left shoulder acromial space

## 2015-12-02 ENCOUNTER — Other Ambulatory Visit: Payer: Self-pay | Admitting: Family Medicine

## 2015-12-04 ENCOUNTER — Other Ambulatory Visit: Payer: Self-pay | Admitting: *Deleted

## 2015-12-04 DIAGNOSIS — C50912 Malignant neoplasm of unspecified site of left female breast: Secondary | ICD-10-CM

## 2015-12-05 ENCOUNTER — Other Ambulatory Visit (HOSPITAL_BASED_OUTPATIENT_CLINIC_OR_DEPARTMENT_OTHER): Payer: Medicare Other

## 2015-12-05 DIAGNOSIS — C50912 Malignant neoplasm of unspecified site of left female breast: Secondary | ICD-10-CM

## 2015-12-05 DIAGNOSIS — C50512 Malignant neoplasm of lower-outer quadrant of left female breast: Secondary | ICD-10-CM | POA: Diagnosis present

## 2015-12-05 LAB — CBC WITH DIFFERENTIAL/PLATELET
BASO%: 0.4 % (ref 0.0–2.0)
Basophils Absolute: 0 10*3/uL (ref 0.0–0.1)
EOS%: 1.8 % (ref 0.0–7.0)
Eosinophils Absolute: 0.1 10*3/uL (ref 0.0–0.5)
HEMATOCRIT: 40.1 % (ref 34.8–46.6)
HEMOGLOBIN: 13.1 g/dL (ref 11.6–15.9)
LYMPH#: 2.8 10*3/uL (ref 0.9–3.3)
LYMPH%: 37.9 % (ref 14.0–49.7)
MCH: 32.3 pg (ref 25.1–34.0)
MCHC: 32.7 g/dL (ref 31.5–36.0)
MCV: 98.8 fL (ref 79.5–101.0)
MONO#: 0.7 10*3/uL (ref 0.1–0.9)
MONO%: 9.3 % (ref 0.0–14.0)
NEUT%: 50.6 % (ref 38.4–76.8)
NEUTROS ABS: 3.7 10*3/uL (ref 1.5–6.5)
Platelets: 210 10*3/uL (ref 145–400)
RBC: 4.06 10*6/uL (ref 3.70–5.45)
RDW: 14 % (ref 11.2–14.5)
WBC: 7.3 10*3/uL (ref 3.9–10.3)

## 2015-12-05 LAB — COMPREHENSIVE METABOLIC PANEL
ALBUMIN: 3.4 g/dL — AB (ref 3.5–5.0)
ALK PHOS: 66 U/L (ref 40–150)
AST: 14 U/L (ref 5–34)
Anion Gap: 8 mEq/L (ref 3–11)
BUN: 23.8 mg/dL (ref 7.0–26.0)
CO2: 29 mEq/L (ref 22–29)
CREATININE: 0.9 mg/dL (ref 0.6–1.1)
Calcium: 9.5 mg/dL (ref 8.4–10.4)
Chloride: 103 mEq/L (ref 98–109)
EGFR: 60 mL/min/{1.73_m2} — ABNORMAL LOW (ref >90)
GLUCOSE: 109 mg/dL (ref 70–140)
POTASSIUM: 4.2 meq/L (ref 3.5–5.1)
SODIUM: 140 meq/L (ref 136–145)
Total Bilirubin: 0.86 mg/dL (ref 0.20–1.20)
Total Protein: 6.7 g/dL (ref 6.4–8.3)

## 2015-12-12 ENCOUNTER — Telehealth: Payer: Self-pay | Admitting: Oncology

## 2015-12-12 ENCOUNTER — Ambulatory Visit (HOSPITAL_BASED_OUTPATIENT_CLINIC_OR_DEPARTMENT_OTHER): Payer: PRIVATE HEALTH INSURANCE | Admitting: Oncology

## 2015-12-12 VITALS — BP 135/76 | HR 84 | Temp 98.3°F | Resp 18 | Wt 179.0 lb

## 2015-12-12 DIAGNOSIS — M8580 Other specified disorders of bone density and structure, unspecified site: Secondary | ICD-10-CM

## 2015-12-12 DIAGNOSIS — N649 Disorder of breast, unspecified: Secondary | ICD-10-CM | POA: Diagnosis not present

## 2015-12-12 DIAGNOSIS — C50912 Malignant neoplasm of unspecified site of left female breast: Secondary | ICD-10-CM | POA: Diagnosis not present

## 2015-12-12 NOTE — Progress Notes (Signed)
ID: Kathy [name]   DOB: 1927/07/20  MR#: [id number]  KHV#:[id number]   PCP:  Kathy Fraction, MD GYN: Kathy Hook, MD] Other:    CHIEF COMPLAINT: Early stage estrogen receptor positive breast cancer  CURRENT TREATMENT: letrozole  BREAST CANCER HISTORY:: From the original intake note:  The patient is an 82 year old [place] woman who had routine yearly screening mammography at [facility] 03/23/2011. A possible area of distortion was noted in the upper outer quadrant of the left breast and she was recalled for additional views November 29. There was a stellate mass in the outer lower quadrant of the left breast which appears more prominent. By ultrasound there was a suggestion of a 1 cm mass at the site of the scar.   BSGI was performed 03/31/2011 1 showed a focal intense isotopic area of activity measuring 1.6 cm at the site in question. Biopsy of this area was performed 04/07/2011 and showed [phone]) and invasive ductal carcinoma, grade 2, which was estrogen receptor positive at 100%, progesterone receptor positive at 69%, with an MIB-1-1 of 10%, and no HER-2 amplification.   The patient had her left lumpectomy, 05/06/2011. This showed multiple positive margins, so on 05/28/2011 she went back of to the operating table for margin clearance. This was successfully accomplished.   Her subsequent history is as detailed below.  INTERVAL HISTORY: Kathy [name] returns today for followup of her left-sided breast cancer. She continues on letrozole, with good tolerance. Hot flashes and vaginal dryness are not major concerns. She obtains a drug at a good price.  She tells me there is a change in her left breast that she wanted me to look at. She made a similar request at her last visit. She also had mammography in May of this year, which was stable.  REVIEW OF SYSTEMS: Kathy [name] Does her housework, drives, and does some cooking. Her worst problemis arthritis. She also has a runny nose, dentures  don't fit very well, heartburn, and stress urinary incontinence.Problems or new or more intense or persistent than before. A detailed review of systems today was otherwise stable  PAST MEDICAL HISTORY: Past Medical History:  Diagnosis Date  . Arthritis    both shoulders  . Atrial fibrillation ([place])   . Breast cancer ([place])   . Cancer ([place])    left breast  . Cataracts, bilateral   . Diverticulitis   . Diverticulosis   . Hemorrhoids    internal  . Hiatal hernia   . Hyperlipidemia    takes Zocor daily  . Hypertension    takes Amlodipine daily  . Joint pain    both shoulders  . Macular degeneration 02/2003  . Nasal congestion   . Nocturia   . Obesity   . Osteoporosis   . PONV (postoperative nausea and vomiting)   . Shortness of breath    with exertion  . Sinus drainage   . Urinary frequency   . Urinary incontinence     PAST SURGICAL HISTORY: Past Surgical History:  Procedure Laterality Date  . bilateral knee replacements  2008  . BREAST BIOPSY  05/04/2011   Procedure: BREAST BIOPSY WITH NEEDLE LOCALIZATION;  Surgeon: Kathy Keens, DO;  Location: [place];  Service: General;  Laterality: Left;  . CARDIOVERSION N/A 09/15/2012   Procedure: CARDIOVERSION;  Surgeon: Laverda Page, MD;  Location: [place];  Service: Cardiovascular;  Laterality: N/A;  h&p in file-HW  . COLONOSCOPY    . ESOPHAGOGASTRODUODENOSCOPY    . HEMORRHOID SURGERY  outpatient "done in doctor's office"  . JOINT REPLACEMENT  2008   bilateral knee replacement  . left breast biopsy  1984  . right breast biopsy  1993    FAMILY HISTORY Family History  Problem Relation Age of Onset  . Cancer Mother     pancreatic  . Cancer Father     esophageal  . Cancer Sister     ovarian  . Cancer Sister     lung  . Cancer Brother     kidney  . Cancer Sister     breast cancer  . Anesthesia problems Neg Hx   . Hypotension Neg Hx   . Malignant hyperthermia Neg Hx   . Pseudochol deficiency Neg Hx    . Heart disease Brother   . COPD Brother   . Cancer Brother     Kidney/Nephrectomy  The patient's father died from stomach or esophageal cancer at the age of 31. The patient's mother died from pancreas cancer at the age of 59 the patient has 82 sisters and 4 brothers. One brother died from kidney cancer and the other 3 from heart disease. One sister died of ovarian cancer which was diagnosed in her 65s. One sister died from "female organ cancer" diagnosed in her 42s. One sister died from lung cancer. The other surviving sister, currently 10, was just diagnosed with breast cancer.    Gynecologic history: GX P0. Menarche age 68, menopause in 55. She took hormone replacement approximately 8 years, stopping about 10 years ago. She had no complications from that treatment.   Social history: She used to work in a [employer], but is now retired. Her husband "S. L." used to work in a Writer. He died in Mar 24, 2013. The patient lives alone. She drives, does all her cooking and a house chores, and is normally active for an 82 year old. She does not exercise regularly.  ADVANCED DIRECTIVES: No advanced directives in place   HEALTH MAINTENANCE: Social History  Substance Use Topics  . Smoking status: Former Smoker    Packs/day: 0.25    Years: 10.00    Types: Cigarettes    Quit date: 05/21/1971  . Smokeless tobacco: Never Used     Comment: quit in the 60's  . Alcohol use No   Colonoscopy: 2006  PAP: 2000  Bone density: 04/06/2013 at [facility] showed a T score of -1.1 (stable to improved) Cholesterol: controlled w. Medication.     No Known Allergies  Current Outpatient Prescriptions  Medication Sig Dispense Refill  . Calcium Carbonate-Vitamin D (CALTRATE 600+D) 600-400 MG-UNIT per tablet Take 1 tablet by mouth 2 (two) times daily.      . cholecalciferol (VITAMIN D) 400 UNITS TABS Take 400 Units by mouth daily. Take in addition to Caltrate with Vitamin D to get 1200 units of Vitamin  D    . digoxin (LANOXIN) 0.125 MG tablet Take 0.125 mg by mouth daily. None on Sat or Sun    . HYDROcodone-acetaminophen (NORCO) 5-325 MG tablet Take 1 tablet by mouth every 6 (six) hours as needed for moderate pain. 30 tablet 0  . letrozole (FEMARA) 2.5 MG tablet TAKE 1 TABLET BY MOUTH DAILY. 90 tablet 3  . magnesium gluconate (MAGONATE) 500 MG tablet Take 500 mg by mouth 2 (two) times daily.    . metoprolol tartrate (LOPRESSOR) 25 MG tablet Take 50 mg by mouth 2 (two) times daily.    . simvastatin (ZOCOR) 20 MG tablet TAKE ONE TABLET BY MOUTH AT  BEDTIME 30 tablet 3  . valsartan-hydrochlorothiazide (DIOVAN-HCT) 320-25 MG tablet TAKE ONE TABLET BY MOUTH ONCE DAILY 30 tablet 5  . vitamin B-12 (CYANOCOBALAMIN) 1000 MCG tablet Take 1,000 mcg by mouth daily.    Marland Kitchen warfarin (COUMADIN) 5 MG tablet Take 2 tablets (10 mg total) by mouth daily. (Patient taking differently: Take 10 mg by mouth daily. Coumain 2.5 mg Tuesday & Saturday and 5 mg other days) 60 tablet 2   No current facility-administered medications for this visit.     OBJECTIVE: elderly white woman in no acute distress Vitals:   12/12/15 1406  BP: 135/76  Pulse: 84  Resp: 18  Temp: 98.3 F (36.8 C)     Body mass index is 34.96 kg/m.    ECOG FS: 1 Filed Weights   12/12/15 1406  Weight: 179 lb (81.2 kg)   Sclerae unicteric Oropharynx clear, Slightly dry No cervical or supraclavicular adenopathy Lungs no rales or rhonchi Heart regular rate and rhythm Abd soft, obese,nontender, positive bowel sounds MSK no focal spinal tenderness, no upper extremity lymphedema Neuro: nonfocal, well oriented, appropriate affect Breasts: the right breast is unremarkable. The left breast is status post lumpectomy and there is scar tissue subjacent to the incision line. There is mild nipple retraction. Compare to my discretion of these changes last visit, I detect no change. The left axilla is benign  LAB RESULTS: Lab Results  Component Value Date    WBC 7.3 12/05/2015   NEUTROABS 3.7 12/05/2015   HGB 13.1 12/05/2015   HCT 40.1 12/05/2015   MCV 98.8 12/05/2015   PLT 210 12/05/2015      Chemistry      Component Value Date/Time   NA 140 12/05/2015 1355   K 4.2 12/05/2015 1355   CL 105 07/24/2015 1008   CL 102 05/06/2012 0959   CO2 29 12/05/2015 1355   BUN 23.8 12/05/2015 1355   CREATININE 0.9 12/05/2015 1355      Component Value Date/Time   CALCIUM 9.5 12/05/2015 1355   ALKPHOS 66 12/05/2015 1355   AST 14 12/05/2015 1355   ALT <9 12/05/2015 1355   BILITOT 0.86 12/05/2015 1355       STUDIES: Left mammography at [facility] May 2017 unremarkable by report  ASSESSMENT: 82 y.o. [place] woman   (1)  status post left lumpectomy with subsequent margin clearance January of 2013 for a T1c NX, (stage I) invasive ductal carcinoma, grade 2, strongly estrogen and progesterone receptor positive, with a low MIB-1, and no HER-2 amplification.   (2) on letrozole since February 2013.  (a) Dexa scan 04/06/2013 showed mild osteopenia (T -1.1)  (b) repeat DEXA scan at [facility] 04/12/2015 was stable, with a T score of -1.1    PLAN: Xzaria is now 4-1/2 years out from definitive surgery for her breast cancer with no evidence of disease recurrence. This is very favorable.  She continues to be very concerned about the area in her left breast, which is firm and certainly could feel like cancer except that it has not changed by either physical exam or mammography. She is already scheduled for repeat mammography in October, and I encouraged her to keep that appointment  The overall plan is to continue letrozole to a total of 5 years which will take [place] to February 2013.  At this point I am comfortable releasing Lenor from follow-up with me. However I think she will greatly benefit from continuing follow-up through our survivorship program. I discussed this with her and gave her  information on it. I have scheduled her to see our survivorship nurse  practitioner next February, at which time she can stop her letrozole and continue survivorship follow-up  As far as breast cancer follow-up is concerned all she will need is yearly mammography and a yearly physician breast exam.  At this point not benefit any further routine appointments for her with me here, but of course I will be glad to see her at any point in the future if on when the need arises   Hilary Milks C    12/12/2015

## 2015-12-12 NOTE — Telephone Encounter (Signed)
appt made and avs printed °

## 2015-12-27 ENCOUNTER — Encounter: Payer: Self-pay | Admitting: Family Medicine

## 2015-12-27 ENCOUNTER — Ambulatory Visit (INDEPENDENT_AMBULATORY_CARE_PROVIDER_SITE_OTHER): Payer: Medicare Other | Admitting: Family Medicine

## 2015-12-27 VITALS — BP 132/70 | HR 78 | Temp 98.1°F | Resp 16 | Ht 60.0 in | Wt 177.0 lb

## 2015-12-27 DIAGNOSIS — M19019 Primary osteoarthritis, unspecified shoulder: Secondary | ICD-10-CM

## 2015-12-27 DIAGNOSIS — M25512 Pain in left shoulder: Secondary | ICD-10-CM

## 2015-12-27 DIAGNOSIS — I4891 Unspecified atrial fibrillation: Secondary | ICD-10-CM | POA: Diagnosis not present

## 2015-12-27 DIAGNOSIS — Z23 Encounter for immunization: Secondary | ICD-10-CM

## 2015-12-27 LAB — PT WITH INR/FINGERSTICK
INR, fingerstick: 2.7 — ABNORMAL HIGH (ref 0.80–1.20)
PT FINGERSTICK: 33 s — AB (ref 10.4–12.5)

## 2015-12-27 NOTE — Progress Notes (Signed)
Subjective:    Patient ID: Kathy [name], female    DOB: [birth year], 82 y.o.   MRN: [id number]  HPI 11/15/15 Patient is here today for recheck of her INR. Her INR is 3.0 and therapeutic. She denies any bleeding or bruising.   Liver she is starting to develop pain in her left shoulder again. She is asking for refill on the oxycodone that she takes sparingly for this on early when the pain is severe and keeps her from sleeping. The last time I performed a cortisone injection in her shoulder was in December. It provided relief temporarily. She may be willing to consider this again if the pain worsens.  At that time, my plan was: Continue Coumadin at its present dose and recheck an INR in 6 weeks.  I refilled the patient's Percocet and gave her 30 tablets. Recheck in 6 weeks. If shoulder worsens, proceed with a cortisone injection in the left shoulder acromial space.  12/27/15 Patient is here today to recheck her INR. Her INR is 2.7 and therapeutic. However the left shoulder pain continues to worsen. She is asking for repeat cortisone injection in the left shoulder. The last, performed a cortisone injection in her shoulder was December 2016 it did provide relief temporarily. Past Medical History:  Diagnosis Date  . Arthritis    both shoulders  . Atrial fibrillation ([place])   . Breast cancer ([place])   . Cancer ([place])    left breast  . Cataracts, bilateral   . Diverticulitis   . Diverticulosis   . Hemorrhoids    internal  . Hiatal hernia   . Hyperlipidemia    takes Zocor daily  . Hypertension    takes Amlodipine daily  . Joint pain    both shoulders  . Macular degeneration 02/2003  . Nasal congestion   . Nocturia   . Obesity   . Osteoporosis   . PONV (postoperative nausea and vomiting)   . Shortness of breath    with exertion  . Sinus drainage   . Urinary frequency   . Urinary incontinence    Past Surgical History:  Procedure Laterality Date  . bilateral knee replacements  2008    . BREAST BIOPSY  05/04/2011   Procedure: BREAST BIOPSY WITH NEEDLE LOCALIZATION;  Surgeon: Judieth Keens, DO;  Location: [place];  Service: General;  Laterality: Left;  . CARDIOVERSION N/A 09/15/2012   Procedure: CARDIOVERSION;  Surgeon: Laverda Page, MD;  Location: [place];  Service: Cardiovascular;  Laterality: N/A;  h&p in file-HW  . COLONOSCOPY    . ESOPHAGOGASTRODUODENOSCOPY    . HEMORRHOID SURGERY     outpatient "done in doctor's office"  . JOINT REPLACEMENT  2008   bilateral knee replacement  . left breast biopsy  1984  . right breast biopsy  1993   Current Outpatient Prescriptions on File Prior to Visit  Medication Sig Dispense Refill  . Calcium Carbonate-Vitamin D (CALTRATE 600+D) 600-400 MG-UNIT per tablet Take 1 tablet by mouth 2 (two) times daily.      . cholecalciferol (VITAMIN D) 400 UNITS TABS Take 400 Units by mouth daily. Take in addition to Caltrate with Vitamin D to get 1200 units of Vitamin D    . digoxin (LANOXIN) 0.125 MG tablet Take 0.125 mg by mouth daily. None on Sat or Sun    . HYDROcodone-acetaminophen (NORCO) 5-325 MG tablet Take 1 tablet by mouth every 6 (six) hours as needed for moderate pain. 30 tablet 0  . letrozole (  FEMARA) 2.5 MG tablet TAKE 1 TABLET BY MOUTH DAILY. 90 tablet 3  . magnesium gluconate (MAGONATE) 500 MG tablet Take 500 mg by mouth 2 (two) times daily.    . metoprolol tartrate (LOPRESSOR) 25 MG tablet Take 50 mg by mouth 2 (two) times daily.    . simvastatin (ZOCOR) 20 MG tablet TAKE ONE TABLET BY MOUTH AT BEDTIME 30 tablet 3  . valsartan-hydrochlorothiazide (DIOVAN-HCT) 320-25 MG tablet TAKE ONE TABLET BY MOUTH ONCE DAILY 30 tablet 5  . vitamin B-12 (CYANOCOBALAMIN) 1000 MCG tablet Take 1,000 mcg by mouth daily.    Marland Kitchen warfarin (COUMADIN) 5 MG tablet Take 2 tablets (10 mg total) by mouth daily. (Patient taking differently: Take 10 mg by mouth daily. Coumain 2.5 mg Tuesday & Saturday and 5 mg other days) 60 tablet 2   No current  facility-administered medications on file prior to visit.    No Known Allergies Social History   Social History  . Marital status: Widowed    Spouse name: N/A  . Number of children: N/A  . Years of education: N/A   Occupational History  . retired    Social History Main Topics  . Smoking status: Former Smoker    Packs/day: 0.25    Years: 10.00    Types: Cigarettes    Quit date: 05/21/1971  . Smokeless tobacco: Never Used     Comment: quit in the 60's  . Alcohol use No  . Drug use: No  . Sexual activity: Not Currently   Other Topics Concern  . Not on file   Social History Narrative  . No narrative on file      Review of Systems  All other systems reviewed and are negative.      Objective:   Physical Exam  Constitutional: She is oriented to person, place, and time. She appears well-developed. No distress.  Cardiovascular: Normal rate and normal heart sounds.  An irregularly irregular rhythm present.  Pulmonary/Chest: Effort normal and breath sounds normal. No respiratory distress. She has no wheezes. She has no rales.  Abdominal: Soft. Bowel sounds are normal.  Neurological: She is alert and oriented to person, place, and time. She has normal reflexes. No cranial nerve deficit. She exhibits normal muscle tone. Coordination normal.  Vitals reviewed.         Assessment & Plan:  Osteoarthritis of shoulder, unspecified laterality, unspecified osteoarthritis type  Atrial fibrillation, unspecified - Plan: PT with INR/Fingerstick  Left shoulder pain  INR is therapeutic. No change in Coumadin dose is necessary. Recheck INR in 6 weeks. 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 Using sterile technique, I injected the subacromial space with 2 mL of lidocaine, 2 mL of Marcaine, and 2 mL of 40 mg per mL Kenalog. The patient tolerated procedure well without complication. Recheck in 6 weeks

## 2015-12-31 ENCOUNTER — Other Ambulatory Visit: Payer: Self-pay | Admitting: Family Medicine

## 2015-12-31 NOTE — Addendum Note (Signed)
Addended by: Sheral Flow on: 12/31/2015 08:51 AM   Modules accepted: Orders

## 2016-01-06 ENCOUNTER — Other Ambulatory Visit: Payer: Self-pay | Admitting: Oncology

## 2016-02-04 DIAGNOSIS — Z853 Personal history of malignant neoplasm of breast: Secondary | ICD-10-CM | POA: Diagnosis not present

## 2016-02-04 LAB — HM MAMMOGRAPHY

## 2016-02-07 ENCOUNTER — Ambulatory Visit (INDEPENDENT_AMBULATORY_CARE_PROVIDER_SITE_OTHER): Payer: Medicare Other | Admitting: Family Medicine

## 2016-02-07 DIAGNOSIS — M19019 Primary osteoarthritis, unspecified shoulder: Secondary | ICD-10-CM | POA: Diagnosis not present

## 2016-02-07 DIAGNOSIS — I4891 Unspecified atrial fibrillation: Secondary | ICD-10-CM

## 2016-02-07 LAB — PT WITH INR/FINGERSTICK
INR FINGERSTICK: 2 — AB (ref 0.80–1.20)
PT FINGERSTICK: 24.4 s — AB (ref 10.4–12.5)

## 2016-02-07 MED ORDER — HYDROCODONE-ACETAMINOPHEN 5-325 MG PO TABS: 1.0000 | ORAL_TABLET | Freq: Four times a day (QID) | ORAL | 0 refills | Status: DC | PRN

## 2016-02-07 NOTE — Progress Notes (Signed)
Subjective:    Patient ID: Kathy [name], female    DOB: [birth year], 82 y.o.   MRN: [id number]  HPI7/21/17 Patient is here today for recheck of her INR. Her INR is 3.0 and therapeutic. She denies any bleeding or bruising.   Liver she is starting to develop pain in her left shoulder again. She is asking for refill on the oxycodone that she takes sparingly for this on early when the pain is severe and keeps her from sleeping. The last time I performed a cortisone injection in her shoulder was in December. It provided relief temporarily. She may be willing to consider this again if the pain worsens.  At that time, my plan was: Continue Coumadin at its present dose and recheck an INR in 6 weeks.  I refilled the patient's Percocet and gave her 30 tablets. Recheck in 6 weeks. If shoulder worsens, proceed with a cortisone injection in the left shoulder acromial space.  12/27/15 Patient is here today to recheck her INR. Her INR is 2.7 and therapeutic. However the left shoulder pain continues to worsen. She is asking for repeat cortisone injection in the left shoulder. The last, performed a cortisone injection in her shoulder was December 2016 it did provide relief temporarily.  At that time, my plan was: INR is therapeutic. No change in Coumadin dose is necessary. Recheck INR in 6 weeks. 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 Using sterile technique, I injected the subacromial space with 2 mL of lidocaine, 2 mL of Marcaine, and 2 mL of 40 mg per mL Kenalog. The patient tolerated procedure well without complication. Recheck in 6 weeks  02/07/16 INR is 2.0 and therapeutic.  Past Medical History:  Diagnosis Date  . Arthritis    both shoulders  . Atrial fibrillation ([place])   . Breast cancer ([place])   . Cancer ([place])    left breast  . Cataracts, bilateral   . Diverticulitis   . Diverticulosis   . Hemorrhoids    internal  . Hiatal  hernia   . Hyperlipidemia    takes Zocor daily  . Hypertension    takes Amlodipine daily  . Joint pain    both shoulders  . Macular degeneration 02/2003  . Nasal congestion   . Nocturia   . Obesity   . Osteoporosis   . PONV (postoperative nausea and vomiting)   . Shortness of breath    with exertion  . Sinus drainage   . Urinary frequency   . Urinary incontinence    Past Surgical History:  Procedure Laterality Date  . bilateral knee replacements  2008  . BREAST BIOPSY  05/04/2011   Procedure: BREAST BIOPSY WITH NEEDLE LOCALIZATION;  Surgeon: Judieth Keens, DO;  Location: [facility];  Service: General;  Laterality: Left;  . CARDIOVERSION N/A 09/15/2012   Procedure: CARDIOVERSION;  Surgeon: Laverda Page, MD;  Location: [facility];  Service: Cardiovascular;  Laterality: N/A;  h&p in file-HW  . COLONOSCOPY    . ESOPHAGOGASTRODUODENOSCOPY    . HEMORRHOID SURGERY     outpatient "done in doctor's office"  . JOINT REPLACEMENT  2008   bilateral knee replacement  . left breast biopsy  1984  . right breast biopsy  1993   Current Outpatient Prescriptions on File Prior to Visit  Medication Sig Dispense Refill  . Calcium Carbonate-Vitamin D (CALTRATE 600+D) 600-400 MG-UNIT per tablet Take 1 tablet by mouth 2 (two) times daily.      Marland Kitchen  cholecalciferol (VITAMIN D) 400 UNITS TABS Take 400 Units by mouth daily. Take in addition to Caltrate with Vitamin D to get 1200 units of Vitamin D    . digoxin (LANOXIN) 0.125 MG tablet Take 0.125 mg by mouth daily. None on Sat or Sun    . HYDROcodone-acetaminophen (NORCO) 5-325 MG tablet Take 1 tablet by mouth every 6 (six) hours as needed for moderate pain. 30 tablet 0  . letrozole (FEMARA) 2.5 MG tablet TAKE 1 TABLET BY MOUTH DAILY. 90 tablet 3  . magnesium gluconate (MAGONATE) 500 MG tablet Take 500 mg by mouth 2 (two) times daily.    . metoprolol tartrate (LOPRESSOR) 25 MG tablet Take 50 mg by mouth 2 (two) times daily.    . simvastatin (ZOCOR)  20 MG tablet TAKE ONE TABLET BY MOUTH AT BEDTIME 30 tablet 3  . valsartan-hydrochlorothiazide (DIOVAN-HCT) 320-25 MG tablet TAKE ONE TABLET BY MOUTH ONCE DAILY 30 tablet 5  . vitamin B-12 (CYANOCOBALAMIN) 1000 MCG tablet Take 1,000 mcg by mouth daily.    Marland Kitchen warfarin (COUMADIN) 5 MG tablet Take 2 tablets (10 mg total) by mouth daily. (Patient taking differently: Take 10 mg by mouth daily. Coumain 2.5 mg Tuesday & Saturday and 5 mg other days) 60 tablet 2   No current facility-administered medications on file prior to visit.    No Known Allergies Social History   Social History  . Marital status: Widowed    Spouse name: N/A  . Number of children: N/A  . Years of education: N/A   Occupational History  . retired    Social History Main Topics  . Smoking status: Former Smoker    Packs/day: 0.25    Years: 10.00    Types: Cigarettes    Quit date: 05/21/1971  . Smokeless tobacco: Never Used     Comment: quit in the 60's  . Alcohol use No  . Drug use: No  . Sexual activity: Not Currently   Other Topics Concern  . Not on file   Social History Narrative  . No narrative on file      Review of Systems  All other systems reviewed and are negative.      Objective:   Physical Exam  Constitutional: She is oriented to person, place, and time. She appears well-developed. No distress.  Cardiovascular: Normal rate and normal heart sounds.  An irregularly irregular rhythm present.  Pulmonary/Chest: Effort normal and breath sounds normal. No respiratory distress. She has no wheezes. She has no rales.  Abdominal: Soft. Bowel sounds are normal.  Neurological: She is alert and oriented to person, place, and time. She has normal reflexes. No cranial nerve deficit. She exhibits normal muscle tone. Coordination normal.  Vitals reviewed.         Assessment & Plan:  Atrial fibrillation ([place]) - Plan: PT with INR/Fingerstick Coumadin is therapeutic.  No change in dose.  Recheck in 6 weeks.   I did give the patient 60 norco for arthritic pain in her left shoulder, knees, and back.  She uses it sparingly.

## 2016-02-12 ENCOUNTER — Encounter: Payer: Self-pay | Admitting: Family Medicine

## 2016-03-23 ENCOUNTER — Ambulatory Visit (INDEPENDENT_AMBULATORY_CARE_PROVIDER_SITE_OTHER): Payer: Medicare Other | Admitting: Family Medicine

## 2016-03-23 ENCOUNTER — Encounter: Payer: Self-pay | Admitting: Family Medicine

## 2016-03-23 DIAGNOSIS — I4891 Unspecified atrial fibrillation: Secondary | ICD-10-CM | POA: Diagnosis not present

## 2016-03-23 LAB — PT WITH INR/FINGERSTICK
INR, fingerstick: 2.2 — ABNORMAL HIGH (ref 0.80–1.20)
PT, fingerstick: 26.9 seconds — ABNORMAL HIGH (ref 10.4–12.5)

## 2016-03-23 NOTE — Progress Notes (Signed)
Subjective:    Patient ID: Kathy [name], female    DOB: 1927/06/05, 82 y.o.   MRN: [id number]  HPI7/21/17 Patient is here today for recheck of her INR. Her INR is 3.0 and therapeutic. She denies any bleeding or bruising.   Liver she is starting to develop pain in her left shoulder again. She is asking for refill on the oxycodone that she takes sparingly for this on early when the pain is severe and keeps her from sleeping. The last time I performed a cortisone injection in her shoulder was in December. It provided relief temporarily. She may be willing to consider this again if the pain worsens.  At that time, my plan was: Continue Coumadin at its present dose and recheck an INR in 6 weeks.  I refilled the patient's Percocet and gave her 30 tablets. Recheck in 6 weeks. If shoulder worsens, proceed with a cortisone injection in the left shoulder acromial space.  12/27/15 Patient is here today to recheck her INR. Her INR is 2.7 and therapeutic. However the left shoulder pain continues to worsen. She is asking for repeat cortisone injection in the left shoulder. The last, performed a cortisone injection in her shoulder was December 2016 it did provide relief temporarily.  At that time, my plan was: INR is therapeutic. No change in Coumadin dose is necessary. Recheck INR in 6 weeks. 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 Using sterile technique, I injected the subacromial space with 2 mL of lidocaine, 2 mL of Marcaine, and 2 mL of 40 mg per mL Kenalog. The patient tolerated procedure well without complication. Recheck in 6 weeks  02/07/16 INR is 2.0 and therapeutic. At that time, my plan was: Coumadin is therapeutic.  No change in dose.  Recheck in 6 weeks.  I did give the patient 60 norco for arthritic pain in her left shoulder, knees, and back.  She uses it sparingly.  03/23/16 Here for follow up.  Patient is currently  taking 5 mg of Coumadin every day and 2.5 mg on Tuesdays and Saturdays. INR today is 2.2 and therapeutic    Past Medical History:  Diagnosis Date  . Arthritis    both shoulders  . Atrial fibrillation ([place])   . Breast cancer ([place])   . Cancer ([place])    left breast  . Cataracts, bilateral   . Diverticulitis   . Diverticulosis   . Hemorrhoids    internal  . Hiatal hernia   . Hyperlipidemia    takes Zocor daily  . Hypertension    takes Amlodipine daily  . Joint pain    both shoulders  . Macular degeneration 02/2003  . Nasal congestion   . Nocturia   . Obesity   . Osteoporosis   . PONV (postoperative nausea and vomiting)   . Shortness of breath    with exertion  . Sinus drainage   . Urinary frequency   . Urinary incontinence    Past Surgical History:  Procedure Laterality Date  . bilateral knee replacements  2008  . BREAST BIOPSY  05/04/2011   Procedure: BREAST BIOPSY WITH NEEDLE LOCALIZATION;  Surgeon: Judieth Keens, DO;  Location: [facility];  Service: General;  Laterality: Left;  . CARDIOVERSION N/A 09/15/2012   Procedure: CARDIOVERSION;  Surgeon: Laverda Page, MD;  Location: [facility];  Service: Cardiovascular;  Laterality: N/A;  h&p in file-HW  . COLONOSCOPY    . ESOPHAGOGASTRODUODENOSCOPY    .  HEMORRHOID SURGERY     outpatient "done in doctor's office"  . JOINT REPLACEMENT  2008   bilateral knee replacement  . left breast biopsy  1984  . right breast biopsy  1993   Current Outpatient Prescriptions on File Prior to Visit  Medication Sig Dispense Refill  . Calcium Carbonate-Vitamin D (CALTRATE 600+D) 600-400 MG-UNIT per tablet Take 1 tablet by mouth 2 (two) times daily.      . cholecalciferol (VITAMIN D) 400 UNITS TABS Take 400 Units by mouth daily. Take in addition to Caltrate with Vitamin D to get 1200 units of Vitamin D    . digoxin (LANOXIN) 0.125 MG tablet Take 0.125 mg by mouth daily. None on Sat or Sun    . HYDROcodone-acetaminophen (NORCO) 5-325 MG  tablet Take 1 tablet by mouth every 6 (six) hours as needed for moderate pain. 60 tablet 0  . letrozole (FEMARA) 2.5 MG tablet TAKE 1 TABLET BY MOUTH DAILY. 90 tablet 3  . magnesium gluconate (MAGONATE) 500 MG tablet Take 500 mg by mouth 2 (two) times daily.    . metoprolol tartrate (LOPRESSOR) 25 MG tablet Take 50 mg by mouth 2 (two) times daily.    . simvastatin (ZOCOR) 20 MG tablet TAKE ONE TABLET BY MOUTH AT BEDTIME 30 tablet 3  . valsartan-hydrochlorothiazide (DIOVAN-HCT) 320-25 MG tablet TAKE ONE TABLET BY MOUTH ONCE DAILY 30 tablet 5  . vitamin B-12 (CYANOCOBALAMIN) 1000 MCG tablet Take 1,000 mcg by mouth daily.    Marland Kitchen warfarin (COUMADIN) 5 MG tablet Take 2 tablets (10 mg total) by mouth daily. (Patient taking differently: Take 10 mg by mouth daily. Coumain 2.5 mg Tuesday & Saturday and 5 mg other days) 60 tablet 2   No current facility-administered medications on file prior to visit.    No Known Allergies Social History   Social History  . Marital status: Widowed    Spouse name: N/A  . Number of children: N/A  . Years of education: N/A   Occupational History  . retired    Social History Main Topics  . Smoking status: Former Smoker    Packs/day: 0.25    Years: 10.00    Types: Cigarettes    Quit date: 05/21/1971  . Smokeless tobacco: Never Used     Comment: quit in the 60's  . Alcohol use No  . Drug use: No  . Sexual activity: Not Currently   Other Topics Concern  . Not on file   Social History Narrative  . No narrative on file      Review of Systems  All other systems reviewed and are negative.      Objective:   Physical Exam  Constitutional: She is oriented to person, place, and time. She appears well-developed. No distress.  Cardiovascular: Normal rate and normal heart sounds.  An irregularly irregular rhythm present.  Pulmonary/Chest: Effort normal and breath sounds normal. No respiratory distress. She has no wheezes. She has no rales.  Abdominal: Soft.  Bowel sounds are normal.  Neurological: She is alert and oriented to person, place, and time. She has normal reflexes. No cranial nerve deficit. She exhibits normal muscle tone. Coordination normal.  Vitals reviewed.         Assessment & Plan:  Atrial fibrillation ([place]) - Plan: PT with INR/Fingerstick  INR is therapeutic. No change in therapy at this time. Recheck INR in 6 weeks

## 2016-03-30 DIAGNOSIS — I482 Chronic atrial fibrillation: Secondary | ICD-10-CM | POA: Diagnosis not present

## 2016-03-30 DIAGNOSIS — R0602 Shortness of breath: Secondary | ICD-10-CM | POA: Diagnosis not present

## 2016-03-30 DIAGNOSIS — I10 Essential (primary) hypertension: Secondary | ICD-10-CM | POA: Diagnosis not present

## 2016-05-02 ENCOUNTER — Other Ambulatory Visit: Payer: Self-pay | Admitting: Family Medicine

## 2016-05-05 ENCOUNTER — Encounter: Payer: Self-pay | Admitting: Family Medicine

## 2016-05-05 ENCOUNTER — Ambulatory Visit (INDEPENDENT_AMBULATORY_CARE_PROVIDER_SITE_OTHER): Payer: Medicare Other | Admitting: Family Medicine

## 2016-05-05 DIAGNOSIS — M19019 Primary osteoarthritis, unspecified shoulder: Secondary | ICD-10-CM

## 2016-05-05 DIAGNOSIS — I4891 Unspecified atrial fibrillation: Secondary | ICD-10-CM | POA: Diagnosis not present

## 2016-05-05 LAB — PT WITH INR/FINGERSTICK
INR FINGERSTICK: 2.6 — AB (ref 0.80–1.20)
PT FINGERSTICK: 30.9 s — AB (ref 10.4–12.5)

## 2016-05-05 MED ORDER — HYDROCODONE-ACETAMINOPHEN 5-325 MG PO TABS: 1.0000 | ORAL_TABLET | Freq: Four times a day (QID) | ORAL | 0 refills | Status: DC | PRN

## 2016-05-05 MED ORDER — METOPROLOL TARTRATE 25 MG PO TABS: 50.0000 mg | ORAL_TABLET | Freq: Two times a day (BID) | ORAL | 11 refills | Status: DC

## 2016-05-05 MED ORDER — DIGOXIN 125 MCG PO TABS: 0.1250 mg | ORAL_TABLET | Freq: Every day | ORAL | 11 refills | Status: DC

## 2016-05-05 NOTE — Progress Notes (Signed)
Subjective:    Patient ID: Kathy [name], female    DOB: September 26, 1927, 83 y.o.   MRN: [id number]  HPI Here for follow up.  Patient is currently taking 5 mg of Coumadin every day and 2.5 mg on Tuesdays and Saturdays. INR today is 2.6 and therapeutic    Past Medical History:  Diagnosis Date  . Arthritis    both shoulders  . Atrial fibrillation ([place])   . Breast cancer ([place])   . Cancer ([place])    left breast  . Cataracts, bilateral   . Diverticulitis   . Diverticulosis   . Hemorrhoids    internal  . Hiatal hernia   . Hyperlipidemia    takes Zocor daily  . Hypertension    takes Amlodipine daily  . Joint pain    both shoulders  . Macular degeneration 02/2003  . Nasal congestion   . Nocturia   . Obesity   . Osteoporosis   . PONV (postoperative nausea and vomiting)   . Shortness of breath    with exertion  . Sinus drainage   . Urinary frequency   . Urinary incontinence    Past Surgical History:  Procedure Laterality Date  . bilateral knee replacements  2008  . BREAST BIOPSY  05/04/2011   Procedure: BREAST BIOPSY WITH NEEDLE LOCALIZATION;  Surgeon: Judieth Keens, DO;  Location: [facility];  Service: General;  Laterality: Left;  . CARDIOVERSION N/A 09/15/2012   Procedure: CARDIOVERSION;  Surgeon: Laverda Page, MD;  Location: [facility];  Service: Cardiovascular;  Laterality: N/A;  h&p in file-HW  . COLONOSCOPY    . ESOPHAGOGASTRODUODENOSCOPY    . HEMORRHOID SURGERY     outpatient "done in doctor's office"  . JOINT REPLACEMENT  2008   bilateral knee replacement  . left breast biopsy  1984  . right breast biopsy  1993   Current Outpatient Prescriptions on File Prior to Visit  Medication Sig Dispense Refill  . Calcium Carbonate-Vitamin D (CALTRATE 600+D) 600-400 MG-UNIT per tablet Take 1 tablet by mouth 2 (two) times daily.      . cholecalciferol (VITAMIN D) 400 UNITS TABS Take 400 Units by mouth daily. Take in addition to Caltrate with Vitamin D to get 1200 units of  Vitamin D    . digoxin (LANOXIN) 0.125 MG tablet Take 0.125 mg by mouth daily. None on Sat or Sun    . HYDROcodone-acetaminophen (NORCO) 5-325 MG tablet Take 1 tablet by mouth every 6 (six) hours as needed for moderate pain. 60 tablet 0  . letrozole (FEMARA) 2.5 MG tablet TAKE 1 TABLET BY MOUTH DAILY. 90 tablet 3  . magnesium gluconate (MAGONATE) 500 MG tablet Take 500 mg by mouth 2 (two) times daily.    . metoprolol tartrate (LOPRESSOR) 25 MG tablet Take 50 mg by mouth 2 (two) times daily.    . simvastatin (ZOCOR) 20 MG tablet TAKE ONE TABLET BY MOUTH AT BEDTIME 90 tablet 3  . valsartan-hydrochlorothiazide (DIOVAN-HCT) 320-25 MG tablet TAKE ONE TABLET BY MOUTH ONCE DAILY 30 tablet 5  . vitamin B-12 (CYANOCOBALAMIN) 1000 MCG tablet Take 1,000 mcg by mouth daily.    Marland Kitchen warfarin (COUMADIN) 5 MG tablet Take 2 tablets (10 mg total) by mouth daily. (Patient taking differently: Take 10 mg by mouth daily. Coumain 2.5 mg Tuesday & Saturday and 5 mg other days) 60 tablet 2   No current facility-administered medications on file prior to visit.    No Known Allergies Social History   Social History  .  Marital status: Widowed    Spouse name: N/A  . Number of children: N/A  . Years of education: N/A   Occupational History  . retired    Social History Main Topics  . Smoking status: Former Smoker    Packs/day: 0.25    Years: 10.00    Types: Cigarettes    Quit date: 05/21/1971  . Smokeless tobacco: Never Used     Comment: quit in the 60's  . Alcohol use No  . Drug use: No  . Sexual activity: Not Currently   Other Topics Concern  . Not on file   Social History Narrative  . No narrative on file      Review of Systems  All other systems reviewed and are negative.      Objective:   Physical Exam  Constitutional: She is oriented to person, place, and time. She appears well-developed. No distress.  Cardiovascular: Normal rate and normal heart sounds.  An irregularly irregular rhythm  present.  Pulmonary/Chest: Effort normal and breath sounds normal. No respiratory distress. She has no wheezes. She has no rales.  Abdominal: Soft. Bowel sounds are normal.  Neurological: She is alert and oriented to person, place, and time. She has normal reflexes. No cranial nerve deficit. She exhibits normal muscle tone. Coordination normal.  Vitals reviewed.         Assessment & Plan:  Atrial fibrillation ([facility])  INR is therapeutic. No change in therapy at this time. Recheck INR in 6 weeks.  I refilled Norco 5/325 (60 tabs) which she takes sparingly for arthritic pain.

## 2016-06-05 ENCOUNTER — Telehealth: Payer: Self-pay | Admitting: Adult Health

## 2016-06-05 NOTE — Telephone Encounter (Signed)
Patient called and said she needed to cancel appointment on 06/25/2016

## 2016-06-16 ENCOUNTER — Ambulatory Visit (INDEPENDENT_AMBULATORY_CARE_PROVIDER_SITE_OTHER): Payer: Medicare Other | Admitting: Family Medicine

## 2016-06-16 DIAGNOSIS — I4891 Unspecified atrial fibrillation: Secondary | ICD-10-CM

## 2016-06-16 LAB — PT WITH INR/FINGERSTICK
INR, fingerstick: 2.5 — ABNORMAL HIGH (ref 0.80–1.20)
PT, fingerstick: 30.2 seconds — ABNORMAL HIGH (ref 10.4–12.5)

## 2016-06-16 MED ORDER — METOPROLOL TARTRATE 50 MG PO TABS: 50.0000 mg | ORAL_TABLET | Freq: Two times a day (BID) | ORAL | 11 refills | Status: DC

## 2016-06-16 MED ORDER — DIGOXIN 125 MCG PO TABS: 0.1250 mg | ORAL_TABLET | Freq: Every day | ORAL | 11 refills | Status: DC

## 2016-06-16 NOTE — Progress Notes (Signed)
Subjective:    Patient ID: Kathy [name], female    DOB: [birth year], 83 y.o.   MRN: [id number]  HPI Here for follow up.  Patient is currently taking 5 mg of Coumadin every day and 2.5 mg on Tuesdays and Saturdays. INR today is 2.5 and therapeutic    Past Medical History:  Diagnosis Date  . Arthritis    both shoulders  . Atrial fibrillation ([place])   . Breast cancer ([place])   . Cancer ([place])    left breast  . Cataracts, bilateral   . Diverticulitis   . Diverticulosis   . Hemorrhoids    internal  . Hiatal hernia   . Hyperlipidemia    takes Zocor daily  . Hypertension    takes Amlodipine daily  . Joint pain    both shoulders  . Macular degeneration 02/2003  . Nasal congestion   . Nocturia   . Obesity   . Osteoporosis   . PONV (postoperative nausea and vomiting)   . Shortness of breath    with exertion  . Sinus drainage   . Urinary frequency   . Urinary incontinence    Past Surgical History:  Procedure Laterality Date  . bilateral knee replacements  2008  . BREAST BIOPSY  05/04/2011   Procedure: BREAST BIOPSY WITH NEEDLE LOCALIZATION;  Surgeon: Judieth Keens, DO;  Location: [facility];  Service: General;  Laterality: Left;  . CARDIOVERSION N/A 09/15/2012   Procedure: CARDIOVERSION;  Surgeon: Laverda Page, MD;  Location: [facility];  Service: Cardiovascular;  Laterality: N/A;  h&p in file-HW  . COLONOSCOPY    . ESOPHAGOGASTRODUODENOSCOPY    . HEMORRHOID SURGERY     outpatient "done in doctor's office"  . JOINT REPLACEMENT  2008   bilateral knee replacement  . left breast biopsy  1984  . right breast biopsy  1993   Current Outpatient Prescriptions on File Prior to Visit  Medication Sig Dispense Refill  . Calcium Carbonate-Vitamin D (CALTRATE 600+D) 600-400 MG-UNIT per tablet Take 1 tablet by mouth 2 (two) times daily.      . cholecalciferol (VITAMIN D) 400 UNITS TABS Take 400 Units by mouth daily. Take in addition to Caltrate with Vitamin D to get 1200 units of  Vitamin D    . digoxin (LANOXIN) 0.125 MG tablet Take 1 tablet (0.125 mg total) by mouth daily. None on Sat or Sun 30 tablet 11  . HYDROcodone-acetaminophen (NORCO) 5-325 MG tablet Take 1 tablet by mouth every 6 (six) hours as needed for moderate pain. 60 tablet 0  . letrozole (FEMARA) 2.5 MG tablet TAKE 1 TABLET BY MOUTH DAILY. 90 tablet 3  . magnesium gluconate (MAGONATE) 500 MG tablet Take 500 mg by mouth 2 (two) times daily.    . metoprolol tartrate (LOPRESSOR) 25 MG tablet Take 2 tablets (50 mg total) by mouth 2 (two) times daily. 60 tablet 11  . simvastatin (ZOCOR) 20 MG tablet TAKE ONE TABLET BY MOUTH AT BEDTIME 90 tablet 3  . valsartan-hydrochlorothiazide (DIOVAN-HCT) 320-25 MG tablet TAKE ONE TABLET BY MOUTH ONCE DAILY 30 tablet 5  . vitamin B-12 (CYANOCOBALAMIN) 1000 MCG tablet Take 1,000 mcg by mouth daily.    Marland Kitchen warfarin (COUMADIN) 5 MG tablet Take 2 tablets (10 mg total) by mouth daily. (Patient taking differently: Take 10 mg by mouth daily. Coumain 2.5 mg Tuesday & Saturday and 5 mg other days) 60 tablet 2   No current facility-administered medications on file prior to visit.    No  Known Allergies Social History   Social History  . Marital status: Widowed    Spouse name: N/A  . Number of children: N/A  . Years of education: N/A   Occupational History  . retired    Social History Main Topics  . Smoking status: Former Smoker    Packs/day: 0.25    Years: 10.00    Types: Cigarettes    Quit date: 05/21/1971  . Smokeless tobacco: Never Used     Comment: quit in the 60's  . Alcohol use No  . Drug use: No  . Sexual activity: Not Currently   Other Topics Concern  . Not on file   Social History Narrative  . No narrative on file      Review of Systems  All other systems reviewed and are negative.      Objective:   Physical Exam  Constitutional: She is oriented to person, place, and time. She appears well-developed. No distress.  Cardiovascular: Normal rate and  normal heart sounds.  An irregularly irregular rhythm present.  Pulmonary/Chest: Effort normal and breath sounds normal. No respiratory distress. She has no wheezes. She has no rales.  Abdominal: Soft. Bowel sounds are normal.  Neurological: She is alert and oriented to person, place, and time. She has normal reflexes. No cranial nerve deficit. She exhibits normal muscle tone. Coordination normal.  Vitals reviewed.         Assessment & Plan:  Atrial fibrillation ([facility])  INR is therapeutic. No change in therapy at this time. Recheck INR in 6 weeks.

## 2016-06-22 ENCOUNTER — Encounter: Payer: Medicare Other | Admitting: Adult Health

## 2016-06-25 ENCOUNTER — Encounter: Payer: Medicare Other | Admitting: Adult Health

## 2016-07-28 ENCOUNTER — Ambulatory Visit: Payer: Medicare Other | Admitting: Family Medicine

## 2016-07-28 ENCOUNTER — Other Ambulatory Visit: Payer: Medicare Other

## 2016-07-28 DIAGNOSIS — R791 Abnormal coagulation profile: Secondary | ICD-10-CM

## 2016-07-28 DIAGNOSIS — I4891 Unspecified atrial fibrillation: Secondary | ICD-10-CM | POA: Diagnosis not present

## 2016-07-29 LAB — PROTIME-INR
INR: 3.9 — ABNORMAL HIGH
Prothrombin Time: 39 s — ABNORMAL HIGH (ref 9.0–11.5)

## 2016-07-30 LAB — PT WITH INR/FINGERSTICK
INR, fingerstick: 4.3 — ABNORMAL HIGH (ref 0.9–1.1)
PT, fingerstick: 51.5 seconds — ABNORMAL HIGH (ref 10.5–13.1)

## 2016-07-31 ENCOUNTER — Telehealth: Payer: Self-pay | Admitting: Family Medicine

## 2016-07-31 NOTE — Telephone Encounter (Signed)
Pt aware of lab results and provider recommendations. 2 wk recheck appt made

## 2016-07-31 NOTE — Telephone Encounter (Signed)
Pt calling to check on labs from 07/28/2016.

## 2016-07-31 NOTE — Telephone Encounter (Signed)
-----   Message from Susy Frizzle, MD sent at 07/30/2016 12:17 PM EDT ----- INR was too high. Resume coumadin but 2.5 mg on M,W,F, Sat and 5 mg on Other days and recheck in 2 weeks.

## 2016-08-14 ENCOUNTER — Ambulatory Visit (INDEPENDENT_AMBULATORY_CARE_PROVIDER_SITE_OTHER): Payer: Medicare Other | Admitting: Family Medicine

## 2016-08-14 DIAGNOSIS — I4891 Unspecified atrial fibrillation: Secondary | ICD-10-CM | POA: Diagnosis not present

## 2016-08-14 LAB — PT WITH INR/FINGERSTICK
INR, fingerstick: 2.3 — ABNORMAL HIGH (ref 0.9–1.1)
PT, fingerstick: 27.7 seconds — ABNORMAL HIGH (ref 10.5–13.1)

## 2016-08-14 NOTE — Progress Notes (Signed)
Subjective:    Patient ID: Kathy [name], female    DOB: 01/16/1928, 83 y.o.   MRN: [id number]  HPI Here for follow up.  Patient is currently taking 2.5 mg of Coumadin on M/W/F/Sat and 5 mg on Tuesdays/Thursday/ Sunday. INR today is 2.3 and therapeutic.  Was changed last time when levels were supratherapeutic.    Past Medical History:  Diagnosis Date  . Arthritis    both shoulders  . Atrial fibrillation ([place])   . Breast cancer ([place])   . Cancer ([place])    left breast  . Cataracts, bilateral   . Diverticulitis   . Diverticulosis   . Hemorrhoids    internal  . Hiatal hernia   . Hyperlipidemia    takes Zocor daily  . Hypertension    takes Amlodipine daily  . Joint pain    both shoulders  . Macular degeneration 02/2003  . Nasal congestion   . Nocturia   . Obesity   . Osteoporosis   . PONV (postoperative nausea and vomiting)   . Shortness of breath    with exertion  . Sinus drainage   . Urinary frequency   . Urinary incontinence    Past Surgical History:  Procedure Laterality Date  . bilateral knee replacements  2008  . BREAST BIOPSY  05/04/2011   Procedure: BREAST BIOPSY WITH NEEDLE LOCALIZATION;  Surgeon: Judieth Keens, DO;  Location: [place];  Service: General;  Laterality: Left;  . CARDIOVERSION N/A 09/15/2012   Procedure: CARDIOVERSION;  Surgeon: Laverda Page, MD;  Location: [facility];  Service: Cardiovascular;  Laterality: N/A;  h&p in file-HW  . COLONOSCOPY    . ESOPHAGOGASTRODUODENOSCOPY    . HEMORRHOID SURGERY     outpatient "done in doctor's office"  . JOINT REPLACEMENT  2008   bilateral knee replacement  . left breast biopsy  1984  . right breast biopsy  1993    No Known Allergies Social History   Social History  . Marital status: Widowed    Spouse name: N/A  . Number of children: N/A  . Years of education: N/A   Occupational History  . retired    Social History Main Topics  . Smoking status: Former Smoker    Packs/day: 0.25      Years: 10.00    Types: Cigarettes    Quit date: 05/21/1971  . Smokeless tobacco: Never Used     Comment: quit in the 60's  . Alcohol use No  . Drug use: No  . Sexual activity: Not Currently   Other Topics Concern  . Not on file   Social History Narrative  . No narrative on file      Review of Systems  All other systems reviewed and are negative.      Objective:   Physical Exam  Constitutional: She is oriented to person, place, and time. She appears well-developed. No distress.  Cardiovascular: Normal rate and normal heart sounds.  An irregularly irregular rhythm present.  Pulmonary/Chest: Effort normal and breath sounds normal. No respiratory distress. She has no wheezes. She has no rales.  Abdominal: Soft. Bowel sounds are normal.  Neurological: She is alert and oriented to person, place, and time. She has normal reflexes. No cranial nerve deficit. She exhibits normal muscle tone. Coordination normal.  Vitals reviewed.         Assessment & Plan:     Atrial fibrillation ([place]) - Plan: PT with INR/Fingerstick  Continue Coumadin and at  the current dose and recheck in 6 weeks. Patient would like to try discontinuing simvastatin for 2 weeks to see if her myalgias in her shoulder and in her back will improve. If no better in 2 weeks, she will resume simvastatin

## 2016-09-08 DIAGNOSIS — H524 Presbyopia: Secondary | ICD-10-CM | POA: Diagnosis not present

## 2016-09-08 DIAGNOSIS — H35033 Hypertensive retinopathy, bilateral: Secondary | ICD-10-CM | POA: Diagnosis not present

## 2016-09-08 DIAGNOSIS — H353131 Nonexudative age-related macular degeneration, bilateral, early dry stage: Secondary | ICD-10-CM | POA: Diagnosis not present

## 2016-09-08 DIAGNOSIS — I10 Essential (primary) hypertension: Secondary | ICD-10-CM | POA: Diagnosis not present

## 2016-09-25 ENCOUNTER — Encounter: Payer: Self-pay | Admitting: Family Medicine

## 2016-09-25 ENCOUNTER — Ambulatory Visit (INDEPENDENT_AMBULATORY_CARE_PROVIDER_SITE_OTHER): Payer: Medicare Other | Admitting: Family Medicine

## 2016-09-25 DIAGNOSIS — M19019 Primary osteoarthritis, unspecified shoulder: Secondary | ICD-10-CM

## 2016-09-25 DIAGNOSIS — I4891 Unspecified atrial fibrillation: Secondary | ICD-10-CM

## 2016-09-25 LAB — PT WITH INR/FINGERSTICK
INR, fingerstick: 2.5 — ABNORMAL HIGH (ref 0.9–1.1)
PT FINGERSTICK: 29.7 s — AB (ref 10.5–13.1)

## 2016-09-25 MED ORDER — HYDROCODONE-ACETAMINOPHEN 5-325 MG PO TABS: 1.0000 | ORAL_TABLET | Freq: Four times a day (QID) | ORAL | 0 refills | Status: DC | PRN

## 2016-09-25 NOTE — Progress Notes (Signed)
Subjective:    Patient ID: Kathy [name], female    DOB: [birth year], 83 y.o.   MRN: [id number]  HPI Here for follow up.  Patient is currently taking 2.5 mg of Coumadin on M/W/F/Sat and 5 mg on Tuesdays/Thursday/ Sunday. INR today is 2.5 and therapeutic.   Past Medical History:  Diagnosis Date  . Arthritis    both shoulders  . Atrial fibrillation ([place])   . Breast cancer ([place])   . Cancer ([place])    left breast  . Cataracts, bilateral   . Diverticulitis   . Diverticulosis   . Hemorrhoids    internal  . Hiatal hernia   . Hyperlipidemia    takes Zocor daily  . Hypertension    takes Amlodipine daily  . Joint pain    both shoulders  . Macular degeneration 02/2003  . Nasal congestion   . Nocturia   . Obesity   . Osteoporosis   . PONV (postoperative nausea and vomiting)   . Shortness of breath    with exertion  . Sinus drainage   . Urinary frequency   . Urinary incontinence    Past Surgical History:  Procedure Laterality Date  . bilateral knee replacements  2008  . BREAST BIOPSY  05/04/2011   Procedure: BREAST BIOPSY WITH NEEDLE LOCALIZATION;  Surgeon: Judieth Keens, DO;  Location: [facility];  Service: General;  Laterality: Left;  . CARDIOVERSION N/A 09/15/2012   Procedure: CARDIOVERSION;  Surgeon: Laverda Page, MD;  Location: [employer];  Service: Cardiovascular;  Laterality: N/A;  h&p in file-HW  . COLONOSCOPY    . ESOPHAGOGASTRODUODENOSCOPY    . HEMORRHOID SURGERY     outpatient "done in doctor's office"  . JOINT REPLACEMENT  2008   bilateral knee replacement  . left breast biopsy  1984  . right breast biopsy  1993    No Known Allergies Social History   Social History  . Marital status: Widowed    Spouse name: N/A  . Number of children: N/A  . Years of education: N/A   Occupational History  . retired    Social History Main Topics  . Smoking status: Former Smoker    Packs/day: 0.25    Years: 10.00    Types: Cigarettes    Quit date:  05/21/1971  . Smokeless tobacco: Never Used     Comment: quit in the 60's  . Alcohol use No  . Drug use: No  . Sexual activity: Not Currently   Other Topics Concern  . Not on file   Social History Narrative  . No narrative on file      Review of Systems  All other systems reviewed and are negative.      Objective:   Physical Exam  Constitutional: She is oriented to person, place, and time. She appears well-developed. No distress.  Cardiovascular: Normal rate and normal heart sounds.  An irregularly irregular rhythm present.  Pulmonary/Chest: Effort normal and breath sounds normal. No respiratory distress. She has no wheezes. She has no rales.  Abdominal: Soft. Bowel sounds are normal.  Neurological: She is alert and oriented to person, place, and time. She has normal reflexes. No cranial nerve deficit. She exhibits normal muscle tone. Coordination normal.  Vitals reviewed.         Assessment & Plan:     Atrial fibrillation ([place]) - Plan: PT with INR/Fingerstick  Osteoarthritis of shoulder, unspecified laterality, unspecified osteoarthritis type - Plan: HYDROcodone-acetaminophen (NORCO) 5-325 MG tablet  Continue Coumadin and at the current dose and recheck in 6 weeks.I did refill the patient's hydrocodone that she takes 1 tablet a day as needed for shoulder pain secondary to osteoarthritis.

## 2016-11-03 ENCOUNTER — Other Ambulatory Visit: Payer: Self-pay | Admitting: Family Medicine

## 2016-11-06 ENCOUNTER — Ambulatory Visit (INDEPENDENT_AMBULATORY_CARE_PROVIDER_SITE_OTHER): Payer: Medicare Other | Admitting: Family Medicine

## 2016-11-06 ENCOUNTER — Encounter: Payer: Self-pay | Admitting: Family Medicine

## 2016-11-06 DIAGNOSIS — I4891 Unspecified atrial fibrillation: Secondary | ICD-10-CM | POA: Diagnosis not present

## 2016-11-06 LAB — PT WITH INR/FINGERSTICK
INR FINGERSTICK: 2.3 — AB (ref 0.9–1.1)
PT FINGERSTICK: 27.7 s — AB (ref 10.5–13.1)

## 2016-11-06 NOTE — Progress Notes (Signed)
Subjective:    Patient ID: Kathy [name], female    DOB: 1928/04/15, 83 y.o.   MRN: [id number]  HPI Here for follow up.  Patient is currently taking 2.5 mg of Coumadin on M/W/F/Sat and 5 mg on Tuesdays/Thursday/ Sunday. INR today is 2.3  and therapeutic.   Past Medical History:  Diagnosis Date  . Arthritis    both shoulders  . Atrial fibrillation ([place])   . Breast cancer ([place])   . Cancer ([place])    left breast  . Cataracts, bilateral   . Diverticulitis   . Diverticulosis   . Hemorrhoids    internal  . Hiatal hernia   . Hyperlipidemia    takes Zocor daily  . Hypertension    takes Amlodipine daily  . Joint pain    both shoulders  . Macular degeneration 02/2003  . Nasal congestion   . Nocturia   . Obesity   . Osteoporosis   . PONV (postoperative nausea and vomiting)   . Shortness of breath    with exertion  . Sinus drainage   . Urinary frequency   . Urinary incontinence    Past Surgical History:  Procedure Laterality Date  . bilateral knee replacements  2008  . BREAST BIOPSY  05/04/2011   Procedure: BREAST BIOPSY WITH NEEDLE LOCALIZATION;  Surgeon: Judieth Keens, DO;  Location: [facility];  Service: General;  Laterality: Left;  . CARDIOVERSION N/A 09/15/2012   Procedure: CARDIOVERSION;  Surgeon: Laverda Page, MD;  Location: [facility];  Service: Cardiovascular;  Laterality: N/A;  h&p in file-HW  . COLONOSCOPY    . ESOPHAGOGASTRODUODENOSCOPY    . HEMORRHOID SURGERY     outpatient "done in doctor's office"  . JOINT REPLACEMENT  2008   bilateral knee replacement  . left breast biopsy  1984  . right breast biopsy  1993    No Known Allergies Social History   Social History  . Marital status: Widowed    Spouse name: N/A  . Number of children: N/A  . Years of education: N/A   Occupational History  . retired    Social History Main Topics  . Smoking status: Former Smoker    Packs/day: 0.25    Years: 10.00    Types: Cigarettes    Quit date:  05/21/1971  . Smokeless tobacco: Never Used     Comment: quit in the 60's  . Alcohol use No  . Drug use: No  . Sexual activity: Not Currently   Other Topics Concern  . Not on file   Social History Narrative  . No narrative on file      Review of Systems  All other systems reviewed and are negative.      Objective:   Physical Exam  Constitutional: She is oriented to person, place, and time. She appears well-developed. No distress.  Cardiovascular: Normal rate and normal heart sounds.  An irregularly irregular rhythm present.  Pulmonary/Chest: Effort normal and breath sounds normal. No respiratory distress. She has no wheezes. She has no rales.  Abdominal: Soft. Bowel sounds are normal.  Neurological: She is alert and oriented to person, place, and time. She has normal reflexes. No cranial nerve deficit. She exhibits normal muscle tone. Coordination normal.  Vitals reviewed.         Assessment & Plan:     Atrial fibrillation ([place])  Continue her present Coumadin dose.  Recheck INR in 6 weeks. It has been almost one year since her  last lab panel. She is due for a CBC, CMP, and fasting lipid panel. I will have the patient return fasting at her next visit 6 weeks from now we can check lab work at that visit

## 2016-11-30 ENCOUNTER — Other Ambulatory Visit: Payer: Self-pay | Admitting: Family Medicine

## 2016-11-30 MED ORDER — LOSARTAN POTASSIUM-HCTZ 100-25 MG PO TABS: 1.0000 | ORAL_TABLET | Freq: Every day | ORAL | 3 refills | Status: DC

## 2016-12-16 ENCOUNTER — Ambulatory Visit (INDEPENDENT_AMBULATORY_CARE_PROVIDER_SITE_OTHER): Payer: Medicare Other | Admitting: Family Medicine

## 2016-12-16 ENCOUNTER — Other Ambulatory Visit: Payer: Self-pay | Admitting: Family Medicine

## 2016-12-16 VITALS — BP 110/60 | HR 64 | Temp 98.0°F | Resp 16 | Ht 60.0 in | Wt 170.0 lb

## 2016-12-16 DIAGNOSIS — Z7901 Long term (current) use of anticoagulants: Secondary | ICD-10-CM

## 2016-12-16 DIAGNOSIS — I10 Essential (primary) hypertension: Secondary | ICD-10-CM

## 2016-12-16 DIAGNOSIS — I4891 Unspecified atrial fibrillation: Secondary | ICD-10-CM

## 2016-12-16 DIAGNOSIS — E7800 Pure hypercholesterolemia, unspecified: Secondary | ICD-10-CM | POA: Diagnosis not present

## 2016-12-16 LAB — PT WITH INR/FINGERSTICK
INR, fingerstick: 2.5 — ABNORMAL HIGH (ref 0.9–1.1)
PT FINGERSTICK: 30 s — AB (ref 10.5–13.1)

## 2016-12-16 LAB — CBC WITH DIFFERENTIAL/PLATELET
BASOS PCT: 1 %
Basophils Absolute: 77 cells/uL (ref 0–200)
EOS ABS: 154 {cells}/uL (ref 15–500)
EOS PCT: 2 %
HCT: 43.6 % (ref 35.0–45.0)
Hemoglobin: 14.1 g/dL (ref 12.0–15.0)
Lymphocytes Relative: 37 %
Lymphs Abs: 2849 cells/uL (ref 850–3900)
MCH: 32.2 pg (ref 27.0–33.0)
MCHC: 32.3 g/dL (ref 32.0–36.0)
MCV: 99.5 fL (ref 80.0–100.0)
MONOS PCT: 9 %
MPV: 9.7 fL (ref 7.5–12.5)
Monocytes Absolute: 693 cells/uL (ref 200–950)
NEUTROS ABS: 3927 {cells}/uL (ref 1500–7800)
Neutrophils Relative %: 51 %
PLATELETS: 209 10*3/uL (ref 140–400)
RBC: 4.38 MIL/uL (ref 3.80–5.10)
RDW: 14.4 % (ref 11.0–15.0)
WBC: 7.7 10*3/uL (ref 3.8–10.8)

## 2016-12-16 NOTE — Progress Notes (Signed)
Subjective:    Patient ID: Kathy [name], female    DOB: 02-07-1928, 83 y.o.   MRN: [id number]  HPI Here for follow up.  Patient is currently taking 2.5 mg of Coumadin on M/W/F/Sat and 5 mg on Tuesdays/Thursday/ Sunday. INR today is 2.5 and therapeutic. She also here today for routine checkup. Currently her heart rate is well controlled on the combination of metoprolol and a jock 7. She denies any chest pain, orthopnea, paroxysmal nocturnal dyspnea. She does report deconditioning and shortness of breath with strenuous activity but she admits that she's become relatively sedentary and is not as active as she once was. She denies any melena or hematochezia. She denies any source of blood loss or bleeding or bruising. Her blood pressure today is well controlled. She is due for fasting lab work to monitor her cholesterol. She denies any myalgias or right upper quadrant pain on simvastatin  Past Medical History:  Diagnosis Date  . Arthritis    both shoulders  . Atrial fibrillation ([place])   . Breast cancer ([place])   . Cancer ([place])    left breast  . Cataracts, bilateral   . Diverticulitis   . Diverticulosis   . Hemorrhoids    internal  . Hiatal hernia   . Hyperlipidemia    takes Zocor daily  . Hypertension    takes Amlodipine daily  . Joint pain    both shoulders  . Macular degeneration 02/2003  . Nasal congestion   . Nocturia   . Obesity   . Osteoporosis   . PONV (postoperative nausea and vomiting)   . Shortness of breath    with exertion  . Sinus drainage   . Urinary frequency   . Urinary incontinence    Past Surgical History:  Procedure Laterality Date  . bilateral knee replacements  2008  . BREAST BIOPSY  05/04/2011   Procedure: BREAST BIOPSY WITH NEEDLE LOCALIZATION;  Surgeon: Judieth Keens, DO;  Location: [facility];  Service: General;  Laterality: Left;  . CARDIOVERSION N/A 09/15/2012   Procedure: CARDIOVERSION;  Surgeon: Laverda Page, MD;  Location: [facility];  Service: Cardiovascular;  Laterality: N/A;  h&p in file-HW  . COLONOSCOPY    . ESOPHAGOGASTRODUODENOSCOPY    . HEMORRHOID SURGERY     outpatient "done in doctor's office"  . JOINT REPLACEMENT  2008   bilateral knee replacement  . left breast biopsy  1984  . right breast biopsy  1993    No Known Allergies Social History   Social History  . Marital status: Widowed    Spouse name: N/A  . Number of children: N/A  . Years of education: N/A   Occupational History  . retired    Social History Main Topics  . Smoking status: Former Smoker    Packs/day: 0.25    Years: 10.00    Types: Cigarettes    Quit date: 05/21/1971  . Smokeless tobacco: Never Used     Comment: quit in the 60's  . Alcohol use No  . Drug use: No  . Sexual activity: Not Currently   Other Topics Concern  . Not on file   Social History Narrative  . No narrative on file      Review of Systems  All other systems reviewed and are negative.      Objective:   Physical Exam  Constitutional: She is oriented to person, place, and time. She appears well-developed. No distress.  Cardiovascular: Normal rate  and normal heart sounds.  An irregularly irregular rhythm present.  Pulmonary/Chest: Effort normal and breath sounds normal. No respiratory distress. She has no wheezes. She has no rales.  Abdominal: Soft. Bowel sounds are normal.  Neurological: She is alert and oriented to person, place, and time. She has normal reflexes. No cranial nerve deficit. She exhibits normal muscle tone. Coordination normal.  Vitals reviewed.         Assessment & Plan:     Long term current use of anticoagulant therapy  Atrial fibrillation ([facility]) - Plan: PT with INR/Fingerstick  Essential hypertension - Plan: CBC with Differential/Platelet, COMPLETE METABOLIC PANEL WITH GFR, Lipid panel  Pure hypercholesterolemia  Patient's Coumadin is therapeutic. Recheck INR in 6 weeks. Blood pressure is excellent. I will  make no changes in her antihypertensive medication. She is due to check a CMP to monitor her potassium was low with her renal function test. I will also check a fasting lipid panel. Goal LDL cholesterol is less than 100. I will also check a CBC to evaluate for any sign of anemia given her use for chronic anticoagulant.  Recommended flu shot once available.

## 2016-12-17 LAB — LIPID PANEL
Cholesterol: 96 mg/dL (ref <200)
HDL: 34 mg/dL — ABNORMAL LOW (ref >50)
LDL Cholesterol: 38 mg/dL (ref <100)
Total CHOL/HDL Ratio: 2.8 Ratio (ref <5.0)
Triglycerides: 120 mg/dL (ref <150)
VLDL: 24 mg/dL (ref <30)

## 2016-12-17 LAB — COMPLETE METABOLIC PANEL WITH GFR
ALT: 8 U/L (ref 6–29)
AST: 13 U/L (ref 10–35)
Albumin: 4.1 g/dL (ref 3.6–5.1)
Alkaline Phosphatase: 62 U/L (ref 33–130)
BILIRUBIN TOTAL: 0.9 mg/dL (ref 0.2–1.2)
BUN: 21 mg/dL (ref 7–25)
CO2: 24 mmol/L (ref 20–32)
CREATININE: 1.01 mg/dL — AB (ref 0.60–0.88)
Calcium: 9.4 mg/dL (ref 8.6–10.4)
Chloride: 103 mmol/L (ref 98–110)
GFR, Est African American: 58 mL/min — ABNORMAL LOW (ref >=60)
GFR, Est Non African American: 51 mL/min — ABNORMAL LOW (ref >=60)
GLUCOSE: 120 mg/dL — AB (ref 70–99)
Potassium: 4.4 mmol/L (ref 3.5–5.3)
SODIUM: 140 mmol/L (ref 135–146)
TOTAL PROTEIN: 6.5 g/dL (ref 6.1–8.1)

## 2016-12-18 ENCOUNTER — Ambulatory Visit: Payer: Medicare Other | Admitting: Family Medicine

## 2016-12-18 LAB — HEMOGLOBIN A1C
Hgb A1c MFr Bld: 6 % — ABNORMAL HIGH (ref <5.7)
Mean Plasma Glucose: 126 mg/dL

## 2016-12-21 ENCOUNTER — Encounter: Payer: Self-pay | Admitting: Family Medicine

## 2016-12-21 DIAGNOSIS — R7303 Prediabetes: Secondary | ICD-10-CM | POA: Insufficient documentation

## 2017-01-28 ENCOUNTER — Ambulatory Visit (INDEPENDENT_AMBULATORY_CARE_PROVIDER_SITE_OTHER): Payer: Medicare Other | Admitting: Family Medicine

## 2017-01-28 ENCOUNTER — Encounter: Payer: Self-pay | Admitting: Family Medicine

## 2017-01-28 VITALS — BP 110/68 | HR 77 | Temp 97.9°F | Resp 14 | Ht 60.0 in | Wt 170.5 lb

## 2017-01-28 DIAGNOSIS — I482 Chronic atrial fibrillation, unspecified: Secondary | ICD-10-CM

## 2017-01-28 DIAGNOSIS — R7303 Prediabetes: Secondary | ICD-10-CM

## 2017-01-28 DIAGNOSIS — Z23 Encounter for immunization: Secondary | ICD-10-CM | POA: Diagnosis not present

## 2017-01-28 LAB — PT WITH INR/FINGERSTICK
INR, fingerstick: 2.6 ratio — ABNORMAL HIGH
PT, fingerstick: 31.7 s — ABNORMAL HIGH (ref 10.5–13.1)

## 2017-01-28 NOTE — Progress Notes (Signed)
Subjective:    Patient ID: Kathy [name], female    DOB: 10/01/1927, 83 y.o.   MRN: [id number]  Medication Refill    Here for follow up.  Patient is currently taking 2.5 mg of Coumadin on M/W/F/Sat and 5 mg on Tuesdays/Thursday/ Sunday. INR today is 2.6 and therapeutic. At her last office visit, her sugar was found to be 120. We checked a hemoglobin A1c which was elevated at 6.0 indicating mild prediabetes. She would like to discuss this further.  Past Medical History:  Diagnosis Date  . Arthritis    both shoulders  . Atrial fibrillation ([place])   . Breast cancer ([place])   . Cancer ([place])    left breast  . Cataracts, bilateral   . Diverticulitis   . Diverticulosis   . Hemorrhoids    internal  . Hiatal hernia   . Hyperlipidemia    takes Zocor daily  . Hypertension    takes Amlodipine daily  . Joint pain    both shoulders  . Macular degeneration 02/2003  . Nasal congestion   . Nocturia   . Obesity   . Osteoporosis   . PONV (postoperative nausea and vomiting)   . Prediabetes   . Shortness of breath    with exertion  . Sinus drainage   . Urinary frequency   . Urinary incontinence    Past Surgical History:  Procedure Laterality Date  . bilateral knee replacements  2008  . BREAST BIOPSY  05/04/2011   Procedure: BREAST BIOPSY WITH NEEDLE LOCALIZATION;  Surgeon: Judieth Keens, DO;  Location: [facility];  Service: General;  Laterality: Left;  . CARDIOVERSION N/A 09/15/2012   Procedure: CARDIOVERSION;  Surgeon: Laverda Page, MD;  Location: [facility];  Service: Cardiovascular;  Laterality: N/A;  h&p in file-HW  . COLONOSCOPY    . ESOPHAGOGASTRODUODENOSCOPY    . HEMORRHOID SURGERY     outpatient "done in doctor's office"  . JOINT REPLACEMENT  2008   bilateral knee replacement  . left breast biopsy  1984  . right breast biopsy  1993    No Known Allergies Social History   Social History  . Marital status: Widowed    Spouse name: N/A  . Number of children:  N/A  . Years of education: N/A   Occupational History  . retired    Social History Main Topics  . Smoking status: Former Smoker    Packs/day: 0.25    Years: 10.00    Types: Cigarettes    Quit date: 05/21/1971  . Smokeless tobacco: Never Used     Comment: quit in the 60's  . Alcohol use No  . Drug use: No  . Sexual activity: Not Currently   Other Topics Concern  . Not on file   Social History Narrative  . No narrative on file      Review of Systems  All other systems reviewed and are negative.      Objective:   Physical Exam  Constitutional: She is oriented to person, place, and time. She appears well-developed. No distress.  Cardiovascular: Normal rate and normal heart sounds.  An irregularly irregular rhythm present.  Pulmonary/Chest: Effort normal and breath sounds normal. No respiratory distress. She has no wheezes. She has no rales.  Abdominal: Soft. Bowel sounds are normal.  Neurological: She is alert and oriented to person, place, and time. She has normal reflexes. No cranial nerve deficit. She exhibits normal muscle tone. Coordination normal.  Vitals reviewed.  Assessment & Plan:     Chronic atrial fibrillation ([place]) - Plan: PT with INR/Fingerstick  Prediabetes   Patient's Coumadin is therapeutic. Recheck INR in 6 weeks. She received her flu shot today. We discussed a low carbohydrate diet. I recommended restriction in her consumption of bread, rice, potatoes, posterior. Also recommended reduction in her consumption of sweets. Recheck hemoglobin A1c in 6 months.

## 2017-01-28 NOTE — Addendum Note (Signed)
Addended by: Vonna Kotyk A on: 01/28/2017 04:36 PM   Modules accepted: Orders

## 2017-02-04 DIAGNOSIS — Z1231 Encounter for screening mammogram for malignant neoplasm of breast: Secondary | ICD-10-CM | POA: Diagnosis not present

## 2017-02-04 DIAGNOSIS — Z853 Personal history of malignant neoplasm of breast: Secondary | ICD-10-CM | POA: Diagnosis not present

## 2017-02-04 LAB — HM MAMMOGRAPHY

## 2017-02-08 ENCOUNTER — Encounter: Payer: Self-pay | Admitting: Family Medicine

## 2017-03-11 ENCOUNTER — Ambulatory Visit (INDEPENDENT_AMBULATORY_CARE_PROVIDER_SITE_OTHER): Payer: Medicare Other | Admitting: Family Medicine

## 2017-03-11 ENCOUNTER — Encounter: Payer: Self-pay | Admitting: Family Medicine

## 2017-03-11 VITALS — BP 130/62 | HR 66 | Temp 98.0°F | Resp 16 | Ht 60.0 in | Wt 166.0 lb

## 2017-03-11 DIAGNOSIS — I482 Chronic atrial fibrillation, unspecified: Secondary | ICD-10-CM

## 2017-03-11 NOTE — Progress Notes (Signed)
Subjective:    Patient ID: Kathy [name], female    DOB: [birth year], 83 y.o.   MRN: [id number]  Medication Refill    Here for follow up.  Patient is currently taking 2.5 mg of Coumadin on M/W/F/Sat and 5 mg on Tuesdays/Thursday/ Sunday.   Past Medical History:  Diagnosis Date  . Arthritis    both shoulders  . Atrial fibrillation ([place])   . Breast cancer ([place])   . Cancer ([place])    left breast  . Cataracts, bilateral   . Diverticulitis   . Diverticulosis   . Hemorrhoids    internal  . Hiatal hernia   . Hyperlipidemia    takes Zocor daily  . Hypertension    takes Amlodipine daily  . Joint pain    both shoulders  . Macular degeneration 02/2003  . Nasal congestion   . Nocturia   . Obesity   . Osteoporosis   . PONV (postoperative nausea and vomiting)   . Prediabetes   . Shortness of breath    with exertion  . Sinus drainage   . Urinary frequency   . Urinary incontinence    Past Surgical History:  Procedure Laterality Date  . bilateral knee replacements  2008  . BREAST BIOPSY  05/04/2011   Procedure: BREAST BIOPSY WITH NEEDLE LOCALIZATION;  Surgeon: Judieth Keens, DO;  Location: [facility];  Service: General;  Laterality: Left;  . CARDIOVERSION N/A 09/15/2012   Procedure: CARDIOVERSION;  Surgeon: Laverda Page, MD;  Location: [facility];  Service: Cardiovascular;  Laterality: N/A;  h&p in file-HW  . COLONOSCOPY    . ESOPHAGOGASTRODUODENOSCOPY    . HEMORRHOID SURGERY     outpatient "done in doctor's office"  . JOINT REPLACEMENT  2008   bilateral knee replacement  . left breast biopsy  1984  . right breast biopsy  1993    No Known Allergies Social History   Socioeconomic History  . Marital status: Widowed    Spouse name: Not on file  . Number of children: Not on file  . Years of education: Not on file  . Highest education level: Not on file  Social Needs  . Financial resource strain: Not on file  . Food insecurity - worry: Not on file  . Food  insecurity - inability: Not on file  . Transportation needs - medical: Not on file  . Transportation needs - non-medical: Not on file  Occupational History  . Occupation: retired  Tobacco Use  . Smoking status: Former Smoker    Packs/day: 0.25    Years: 10.00    Pack years: 2.50    Types: Cigarettes    Last attempt to quit: 05/21/1971    Years since quitting: 45.8  . Smokeless tobacco: Never Used  . Tobacco comment: quit in the 60's  Substance and Sexual Activity  . Alcohol use: No  . Drug use: No  . Sexual activity: Not Currently  Other Topics Concern  . Not on file  Social History Narrative  . Not on file      Review of Systems  All other systems reviewed and are negative.      Objective:   Physical Exam  Constitutional: She is oriented to person, place, and time. She appears well-developed. No distress.  Cardiovascular: Normal rate and normal heart sounds. An irregularly irregular rhythm present.  Pulmonary/Chest: Effort normal and breath sounds normal. No respiratory distress. She has no wheezes. She has no rales.  Abdominal:  Soft. Bowel sounds are normal.  Neurological: She is alert and oriented to person, place, and time. She has normal reflexes. No cranial nerve deficit. She exhibits normal muscle tone. Coordination normal.  Vitals reviewed.         Assessment & Plan:     Chronic atrial fibrillation ([place]) - Plan: Protime-INR   Will send INR to the central lab for testing and make any necessary changes tomorrow if needed.  Recheck in 6 weeks.

## 2017-03-12 LAB — PROTIME-INR
INR: 1.8 — AB
Prothrombin Time: 18.5 s — ABNORMAL HIGH (ref 9.0–11.5)

## 2017-04-12 ENCOUNTER — Encounter: Payer: Self-pay | Admitting: Family Medicine

## 2017-04-12 DIAGNOSIS — M8589 Other specified disorders of bone density and structure, multiple sites: Secondary | ICD-10-CM | POA: Diagnosis not present

## 2017-04-22 ENCOUNTER — Encounter: Payer: Self-pay | Admitting: Family Medicine

## 2017-04-22 ENCOUNTER — Ambulatory Visit (INDEPENDENT_AMBULATORY_CARE_PROVIDER_SITE_OTHER): Payer: Medicare Other | Admitting: Family Medicine

## 2017-04-22 DIAGNOSIS — I4891 Unspecified atrial fibrillation: Secondary | ICD-10-CM

## 2017-04-22 LAB — PT WITH INR/FINGERSTICK
INR, fingerstick: 2.4 ratio — ABNORMAL HIGH
PT, fingerstick: 29.2 s — ABNORMAL HIGH (ref 10.5–13.1)

## 2017-04-22 NOTE — Progress Notes (Signed)
Subjective:    Patient ID: Kathy [name], female    DOB: [birth year], 83 y.o.   MRN: [id number]  Medication Refill    Here for follow up.  Patient is currently taking 2.5 mg of Coumadin on M/W/F/Sat and 5 mg on Tuesdays/Thursday/ Sunday.   INR is 2.4  Past Medical History:  Diagnosis Date  . Arthritis    both shoulders  . Atrial fibrillation ([place])   . Breast cancer ([place])   . Cancer ([place])    left breast  . Cataracts, bilateral   . Diverticulitis   . Diverticulosis   . Hemorrhoids    internal  . Hiatal hernia   . Hyperlipidemia    takes Zocor daily  . Hypertension    takes Amlodipine daily  . Joint pain    both shoulders  . Macular degeneration 02/2003  . Nasal congestion   . Nocturia   . Obesity   . Osteoporosis   . PONV (postoperative nausea and vomiting)   . Prediabetes   . Shortness of breath    with exertion  . Sinus drainage   . Urinary frequency   . Urinary incontinence    Past Surgical History:  Procedure Laterality Date  . bilateral knee replacements  2008  . BREAST BIOPSY  05/04/2011   Procedure: BREAST BIOPSY WITH NEEDLE LOCALIZATION;  Surgeon: Judieth Keens, DO;  Location: [facility];  Service: General;  Laterality: Left;  . CARDIOVERSION N/A 09/15/2012   Procedure: CARDIOVERSION;  Surgeon: Laverda Page, MD;  Location: [facility];  Service: Cardiovascular;  Laterality: N/A;  h&p in file-HW  . COLONOSCOPY    . ESOPHAGOGASTRODUODENOSCOPY    . HEMORRHOID SURGERY     outpatient "done in doctor's office"  . JOINT REPLACEMENT  2008   bilateral knee replacement  . left breast biopsy  1984  . right breast biopsy  1993    No Known Allergies Social History   Socioeconomic History  . Marital status: Widowed    Spouse name: Not on file  . Number of children: Not on file  . Years of education: Not on file  . Highest education level: Not on file  Social Needs  . Financial resource strain: Not on file  . Food insecurity - worry: Not on  file  . Food insecurity - inability: Not on file  . Transportation needs - medical: Not on file  . Transportation needs - non-medical: Not on file  Occupational History  . Occupation: retired  Tobacco Use  . Smoking status: Former Smoker    Packs/day: 0.25    Years: 10.00    Pack years: 2.50    Types: Cigarettes    Last attempt to quit: 05/21/1971    Years since quitting: 45.9  . Smokeless tobacco: Never Used  . Tobacco comment: quit in the 60's  Substance and Sexual Activity  . Alcohol use: No  . Drug use: No  . Sexual activity: Not Currently  Other Topics Concern  . Not on file  Social History Narrative  . Not on file      Review of Systems  All other systems reviewed and are negative.      Objective:   Physical Exam  Constitutional: She is oriented to person, place, and time. She appears well-developed. No distress.  Cardiovascular: Normal rate and normal heart sounds. An irregularly irregular rhythm present.  Pulmonary/Chest: Effort normal and breath sounds normal. No respiratory distress. She has no wheezes. She has  no rales.  Abdominal: Soft. Bowel sounds are normal.  Neurological: She is alert and oriented to person, place, and time. She has normal reflexes. No cranial nerve deficit. She exhibits normal muscle tone. Coordination normal.  Vitals reviewed.         Assessment & Plan:     Atrial fibrillation, unspecified type ([place]) - Plan: PT with INR/Fingerstick   INR is therapeutic. No changes in her Coumadin dose. Recheck in 6 weeks

## 2017-05-12 ENCOUNTER — Other Ambulatory Visit: Payer: Self-pay | Admitting: Family Medicine

## 2017-05-19 ENCOUNTER — Other Ambulatory Visit: Payer: Self-pay | Admitting: Family Medicine

## 2017-05-19 MED ORDER — HYDROCHLOROTHIAZIDE 25 MG PO TABS: 25.0000 mg | ORAL_TABLET | Freq: Every day | ORAL | 3 refills | Status: DC

## 2017-05-19 MED ORDER — LOSARTAN POTASSIUM 100 MG PO TABS: 100.0000 mg | ORAL_TABLET | Freq: Every day | ORAL | 3 refills | Status: DC

## 2017-06-03 ENCOUNTER — Ambulatory Visit (INDEPENDENT_AMBULATORY_CARE_PROVIDER_SITE_OTHER): Payer: Medicare Other | Admitting: Family Medicine

## 2017-06-03 ENCOUNTER — Encounter: Payer: Self-pay | Admitting: Family Medicine

## 2017-06-03 VITALS — BP 110/76 | HR 72 | Temp 98.3°F | Resp 16 | Ht 60.0 in | Wt 166.0 lb

## 2017-06-03 DIAGNOSIS — L989 Disorder of the skin and subcutaneous tissue, unspecified: Secondary | ICD-10-CM

## 2017-06-03 DIAGNOSIS — I482 Chronic atrial fibrillation, unspecified: Secondary | ICD-10-CM

## 2017-06-03 LAB — PT WITH INR/FINGERSTICK
INR FINGERSTICK: 1.9 ratio — AB
PT FINGERSTICK: 22.9 s — AB (ref 10.5–13.1)

## 2017-06-03 NOTE — Progress Notes (Signed)
Subjective:    Patient ID: Kathy [name], female    DOB: [birth year], 84 y.o.   MRN: [id number]  Medication Refill    Here for follow up.  Patient is currently taking 2.5 mg of Coumadin on M/W/F/Sat and 5 mg on Tuesdays/Thursday/ Sunday.   INR is 1.9.  She also has an irregular lesion on the skin covering her right nostril that has been there for almost 2 months.  It is approximately 4 mm in diameter.  It is a bleeding scabbed papule.  It will not heal.  Past Medical History:  Diagnosis Date  . Arthritis    both shoulders  . Atrial fibrillation ([place])   . Breast cancer ([place])   . Cancer ([place])    left breast  . Cataracts, bilateral   . Diverticulitis   . Diverticulosis   . Hemorrhoids    internal  . Hiatal hernia   . Hyperlipidemia    takes Zocor daily  . Hypertension    takes Amlodipine daily  . Joint pain    both shoulders  . Macular degeneration 02/2003  . Nasal congestion   . Nocturia   . Obesity   . Osteoporosis   . PONV (postoperative nausea and vomiting)   . Prediabetes   . Shortness of breath    with exertion  . Sinus drainage   . Urinary frequency   . Urinary incontinence    Past Surgical History:  Procedure Laterality Date  . bilateral knee replacements  2008  . BREAST BIOPSY  05/04/2011   Procedure: BREAST BIOPSY WITH NEEDLE LOCALIZATION;  Surgeon: Judieth Keens, DO;  Location: [facility];  Service: General;  Laterality: Left;  . CARDIOVERSION N/A 09/15/2012   Procedure: CARDIOVERSION;  Surgeon: Laverda Page, MD;  Location: [facility];  Service: Cardiovascular;  Laterality: N/A;  h&p in file-HW  . COLONOSCOPY    . ESOPHAGOGASTRODUODENOSCOPY    . HEMORRHOID SURGERY     outpatient "done in doctor's office"  . JOINT REPLACEMENT  2008   bilateral knee replacement  . left breast biopsy  1984  . right breast biopsy  1993    No Known Allergies Social History   Socioeconomic History  . Marital status: Widowed    Spouse name: Not on file    . Number of children: Not on file  . Years of education: Not on file  . Highest education level: Not on file  Social Needs  . Financial resource strain: Not on file  . Food insecurity - worry: Not on file  . Food insecurity - inability: Not on file  . Transportation needs - medical: Not on file  . Transportation needs - non-medical: Not on file  Occupational History  . Occupation: retired  Tobacco Use  . Smoking status: Former Smoker    Packs/day: 0.25    Years: 10.00    Pack years: 2.50    Types: Cigarettes    Last attempt to quit: 05/21/1971    Years since quitting: 46.0  . Smokeless tobacco: Never Used  . Tobacco comment: quit in the 60's  Substance and Sexual Activity  . Alcohol use: No  . Drug use: No  . Sexual activity: Not Currently  Other Topics Concern  . Not on file  Social History Narrative  . Not on file      Review of Systems  All other systems reviewed and are negative.      Objective:   Physical Exam  Constitutional:  She is oriented to person, place, and time. She appears well-developed. No distress.  HENT:  Nose:    Cardiovascular: Normal rate and normal heart sounds. An irregularly irregular rhythm present.  Pulmonary/Chest: Effort normal and breath sounds normal. No respiratory distress. She has no wheezes. She has no rales.  Abdominal: Soft. Bowel sounds are normal.  Neurological: She is alert and oriented to person, place, and time. She has normal reflexes. No cranial nerve deficit. She exhibits normal muscle tone. Coordination normal.  Vitals reviewed.         Assessment & Plan:     Chronic atrial fibrillation ([place]) - Plan: PT with INR/Fingerstick, PT with INR/Fingerstick  Non-healing skin lesion of nose   INR is acceptable. No changes in her Coumadin dose. Recheck in 6 weeks  I suspect the lesion on her right nostril is likely a malignant lesion such as an early squamous cell carcinoma.  I gave the patient and the option of  referral to a dermatologist for an excisional biopsy versus an attempted cryotherapy with liquid nitrogen.  Patient would like to try cryotherapy first to minimize scarring.  Lesion was treated with liquid nitrogen cryotherapy for a total of 30 seconds.  If the lesion persist, I would recommend dermatology consult for excisional biopsy

## 2017-06-10 ENCOUNTER — Other Ambulatory Visit: Payer: Self-pay | Admitting: Family Medicine

## 2017-07-15 ENCOUNTER — Encounter: Payer: Self-pay | Admitting: Family Medicine

## 2017-07-15 ENCOUNTER — Ambulatory Visit (INDEPENDENT_AMBULATORY_CARE_PROVIDER_SITE_OTHER): Payer: Medicare Other | Admitting: Family Medicine

## 2017-07-15 VITALS — BP 112/64 | HR 64 | Temp 98.0°F | Resp 14 | Ht 60.0 in | Wt 162.0 lb

## 2017-07-15 DIAGNOSIS — I482 Chronic atrial fibrillation, unspecified: Secondary | ICD-10-CM

## 2017-07-15 DIAGNOSIS — I10 Essential (primary) hypertension: Secondary | ICD-10-CM

## 2017-07-15 LAB — PT WITH INR/FINGERSTICK
INR, fingerstick: 2.3 ratio — ABNORMAL HIGH
PT FINGERSTICK: 27.5 s — AB (ref 10.5–13.1)

## 2017-07-15 MED ORDER — LISINOPRIL 20 MG PO TABS: 20.0000 mg | ORAL_TABLET | Freq: Every day | ORAL | 3 refills | Status: DC

## 2017-07-15 NOTE — Progress Notes (Signed)
Subjective:    Patient ID: Kathy [name], female    DOB: Dec 31, 1927, 84 y.o.   MRN: [id number]  Medication Refill   06/03/17 Here for follow up.  Patient is currently taking 2.5 mg of Coumadin on M/W/F/Sat and 5 mg on Tuesdays/Thursday/ Sunday.   INR is 1.9.  She also has an irregular lesion on the skin covering her right nostril that has been there for almost 2 months.  It is approximately 4 mm in diameter.  It is a bleeding scabbed papule.  It will not heal.  At that time, my plan was: INR is acceptable. No changes in her Coumadin dose. Recheck in 6 weeks  I suspect the lesion on her right nostril is likely a malignant lesion such as an early squamous cell carcinoma.  I gave the patient and the option of referral to a dermatologist for an excisional biopsy versus an attempted cryotherapy with liquid nitrogen.  Patient would like to try cryotherapy first to minimize scarring.  Lesion was treated with liquid nitrogen cryotherapy for a total of 30 seconds.  If the lesion persist, I would recommend dermatology consult for excisional biopsy.  07/15/17 Here for follow up to repeat INR.   Her INR today is therapeutic at 2.3.  She denies any bleeding or bruising.  Her blood pressure is well controlled at 112/64 however she is currently on losartan 100 mg a day.  Her manufacture has started to be recalled and she is concerned about taking this medication moving forward.  The lesion that I treated with cryotherapy at her last visit has shrunk in size and is now a 3 mm scab with no residual erythema Past Medical History:  Diagnosis Date  . Arthritis    both shoulders  . Atrial fibrillation ([place])   . Breast cancer ([place])   . Cancer ([place])    left breast  . Cataracts, bilateral   . Diverticulitis   . Diverticulosis   . Hemorrhoids    internal  . Hiatal hernia   . Hyperlipidemia    takes Zocor daily  . Hypertension    takes Amlodipine daily  . Joint pain    both shoulders  . Macular  degeneration 02/2003  . Nasal congestion   . Nocturia   . Obesity   . Osteoporosis   . PONV (postoperative nausea and vomiting)   . Prediabetes   . Shortness of breath    with exertion  . Sinus drainage   . Urinary frequency   . Urinary incontinence    Past Surgical History:  Procedure Laterality Date  . bilateral knee replacements  2008  . BREAST BIOPSY  05/04/2011   Procedure: BREAST BIOPSY WITH NEEDLE LOCALIZATION;  Surgeon: Judieth Keens, DO;  Location: [facility];  Service: General;  Laterality: Left;  . CARDIOVERSION N/A 09/15/2012   Procedure: CARDIOVERSION;  Surgeon: Laverda Page, MD;  Location: [facility];  Service: Cardiovascular;  Laterality: N/A;  h&p in file-HW  . COLONOSCOPY    . ESOPHAGOGASTRODUODENOSCOPY    . HEMORRHOID SURGERY     outpatient "done in doctor's office"  . JOINT REPLACEMENT  2008   bilateral knee replacement  . left breast biopsy  1984  . right breast biopsy  1993    No Known Allergies Social History   Socioeconomic History  . Marital status: Widowed    Spouse name: Not on file  . Number of children: Not on file  . Years of education: Not  on file  . Highest education level: Not on file  Occupational History  . Occupation: retired  Scientific laboratory technician  . Financial resource strain: Not on file  . Food insecurity:    Worry: Not on file    Inability: Not on file  . Transportation needs:    Medical: Not on file    Non-medical: Not on file  Tobacco Use  . Smoking status: Former Smoker    Packs/day: 0.25    Years: 10.00    Pack years: 2.50    Types: Cigarettes    Last attempt to quit: 05/21/1971    Years since quitting: 46.1  . Smokeless tobacco: Never Used  . Tobacco comment: quit in the 60's  Substance and Sexual Activity  . Alcohol use: No  . Drug use: No  . Sexual activity: Not Currently  Lifestyle  . Physical activity:    Days per week: Not on file    Minutes per session: Not on file  . Stress: Not on file  Relationships  .  Social connections:    Talks on phone: Not on file    Gets together: Not on file    Attends religious service: Not on file    Active member of club or organization: Not on file    Attends meetings of clubs or organizations: Not on file    Relationship status: Not on file  . Intimate partner violence:    Fear of current or ex partner: Not on file    Emotionally abused: Not on file    Physically abused: Not on file    Forced sexual activity: Not on file  Other Topics Concern  . Not on file  Social History Narrative  . Not on file      Review of Systems  All other systems reviewed and are negative.      Objective:   Physical Exam  Constitutional: She is oriented to person, place, and time. She appears well-developed. No distress.  HENT:  Nose:    Cardiovascular: Normal rate and normal heart sounds. An irregularly irregular rhythm present.  Pulmonary/Chest: Effort normal and breath sounds normal. No respiratory distress. She has no wheezes. She has no rales.  Abdominal: Soft. Bowel sounds are normal.  Neurological: She is alert and oriented to person, place, and time. She has normal reflexes. No cranial nerve deficit. She exhibits normal muscle tone. Coordination normal.  Vitals reviewed.         Assessment & Plan:     Essential hypertension  Chronic atrial fibrillation ([place]) - Plan: PT with INR/Fingerstick    INR is therapeutic. No change in coumadin.  Monitor for resolution of the scab in 6 weeks.  DC losartan and switch to lisinopril 20 mg poqday.

## 2017-07-28 ENCOUNTER — Other Ambulatory Visit: Payer: Self-pay | Admitting: Family Medicine

## 2017-07-29 ENCOUNTER — Ambulatory Visit: Payer: Medicare Other | Admitting: Family Medicine

## 2017-08-26 ENCOUNTER — Ambulatory Visit (INDEPENDENT_AMBULATORY_CARE_PROVIDER_SITE_OTHER): Payer: Medicare Other | Admitting: Family Medicine

## 2017-08-26 ENCOUNTER — Encounter: Payer: Self-pay | Admitting: Family Medicine

## 2017-08-26 VITALS — BP 108/74 | HR 98 | Temp 98.1°F | Ht 60.0 in | Wt 162.0 lb

## 2017-08-26 DIAGNOSIS — L989 Disorder of the skin and subcutaneous tissue, unspecified: Secondary | ICD-10-CM | POA: Diagnosis not present

## 2017-08-26 DIAGNOSIS — I482 Chronic atrial fibrillation, unspecified: Secondary | ICD-10-CM

## 2017-08-26 LAB — PT WITH INR/FINGERSTICK
INR, fingerstick: 2.1 ratio — ABNORMAL HIGH
PT FINGERSTICK: 25.3 s — AB (ref 10.5–13.1)

## 2017-08-26 NOTE — Progress Notes (Signed)
Subjective:    Patient ID: Kathy [name], female    DOB: 02/16/1928, 84 y.o.   MRN: [id number]  Medication Refill   06/03/17 Here for follow up.  Patient is currently taking 2.5 mg of Coumadin on M/W/F/Sat and 5 mg on Tuesdays/Thursday/ Sunday.   INR is 1.9.  She also has an irregular lesion on the skin covering her right nostril that has been there for almost 2 months.  It is approximately 4 mm in diameter.  It is a bleeding scabbed papule.  It will not heal.  At that time, my plan was: INR is acceptable. No changes in her Coumadin dose. Recheck in 6 weeks  I suspect the lesion on her right nostril is likely a malignant lesion such as an early squamous cell carcinoma.  I gave the patient and the option of referral to a dermatologist for an excisional biopsy versus an attempted cryotherapy with liquid nitrogen.  Patient would like to try cryotherapy first to minimize scarring.  Lesion was treated with liquid nitrogen cryotherapy for a total of 30 seconds.  If the lesion persist, I would recommend dermatology consult for excisional biopsy.  07/15/17 Here for follow up to repeat INR.   Her INR today is therapeutic at 2.3.  She denies any bleeding or bruising.  Her blood pressure is well controlled at 112/64 however she is currently on losartan 100 mg a day.  Her manufacture has started to be recalled and she is concerned about taking this medication moving forward.  The lesion that I treated with cryotherapy at her last visit has shrunk in size and is now a 3 mm scab with no residual erythema.  At that time, my plan was: INR is therapeutic. No change in coumadin.  Monitor for resolution of the scab in 6 weeks.  DC losartan and switch to lisinopril 20 mg poqday.  08/26/17 Here today to recheck INR.  Her INR today is therapeutic at 2.1.  Unfortunately she continues to have a 3 mm papular lesion on the right nostril.  It appears to be a thick scab however it will not heal.  It bleeds occasionally.   It has been present now since treatment in February.  Therefore it has been present for almost 3 months.  This raises concern for underlying squamous cell carcinoma.  I have explained this to the patient and have recommended a dermatology consultation for biopsy and curettage Past Medical History:  Diagnosis Date  . Arthritis    both shoulders  . Atrial fibrillation ([place])   . Breast cancer ([place])   . Cancer ([place])    left breast  . Cataracts, bilateral   . Diverticulitis   . Diverticulosis   . Hemorrhoids    internal  . Hiatal hernia   . Hyperlipidemia    takes Zocor daily  . Hypertension    takes Amlodipine daily  . Joint pain    both shoulders  . Macular degeneration 02/2003  . Nasal congestion   . Nocturia   . Obesity   . Osteoporosis   . PONV (postoperative nausea and vomiting)   . Prediabetes   . Shortness of breath    with exertion  . Sinus drainage   . Urinary frequency   . Urinary incontinence    Past Surgical History:  Procedure Laterality Date  . bilateral knee replacements  2008  . BREAST BIOPSY  05/04/2011   Procedure: BREAST BIOPSY WITH NEEDLE LOCALIZATION;  Surgeon: Myrtis Ser  Lilyan Punt, DO;  Location: [facility];  Service: General;  Laterality: Left;  . CARDIOVERSION N/A 09/15/2012   Procedure: CARDIOVERSION;  Surgeon: Laverda Page, MD;  Location: [facility];  Service: Cardiovascular;  Laterality: N/A;  h&p in file-HW  . COLONOSCOPY    . ESOPHAGOGASTRODUODENOSCOPY    . HEMORRHOID SURGERY     outpatient "done in doctor's office"  . JOINT REPLACEMENT  2008   bilateral knee replacement  . left breast biopsy  1984  . right breast biopsy  1993    No Known Allergies Social History   Socioeconomic History  . Marital status: Widowed    Spouse name: Not on file  . Number of children: Not on file  . Years of education: Not on file  . Highest education level: Not on file  Occupational History  . Occupation: retired  Scientific laboratory technician  . Financial resource  strain: Not on file  . Food insecurity:    Worry: Not on file    Inability: Not on file  . Transportation needs:    Medical: Not on file    Non-medical: Not on file  Tobacco Use  . Smoking status: Former Smoker    Packs/day: 0.25    Years: 10.00    Pack years: 2.50    Types: Cigarettes    Last attempt to quit: 05/21/1971    Years since quitting: 46.2  . Smokeless tobacco: Never Used  . Tobacco comment: quit in the 60's  Substance and Sexual Activity  . Alcohol use: No  . Drug use: No  . Sexual activity: Not Currently  Lifestyle  . Physical activity:    Days per week: Not on file    Minutes per session: Not on file  . Stress: Not on file  Relationships  . Social connections:    Talks on phone: Not on file    Gets together: Not on file    Attends religious service: Not on file    Active member of club or organization: Not on file    Attends meetings of clubs or organizations: Not on file    Relationship status: Not on file  . Intimate partner violence:    Fear of current or ex partner: Not on file    Emotionally abused: Not on file    Physically abused: Not on file    Forced sexual activity: Not on file  Other Topics Concern  . Not on file  Social History Narrative  . Not on file      Review of Systems  All other systems reviewed and are negative.      Objective:   Physical Exam  Constitutional: She is oriented to person, place, and time. She appears well-developed. No distress.  HENT:  Nose:    Cardiovascular: Normal rate and normal heart sounds. An irregularly irregular rhythm present.  Pulmonary/Chest: Effort normal and breath sounds normal. No respiratory distress. She has no wheezes. She has no rales.  Abdominal: Soft. Bowel sounds are normal.  Neurological: She is alert and oriented to person, place, and time. She has normal reflexes. No cranial nerve deficit. She exhibits normal muscle tone. Coordination normal.  Vitals reviewed.           Assessment & Plan:     Chronic atrial fibrillation ([place]) - Plan: PT with INR/Fingerstick  INR is therapeutic.  Recheck INR in 6 weeks.  I explained to the patient I am concerned that the lesion on her right nostril is a squamous cell carcinoma.  I recommend a dermatology consultation for biopsy and curettage.  I have recommended dermatology for better cosmetic results.  Patient agrees with dermatology consultation

## 2017-09-13 ENCOUNTER — Other Ambulatory Visit: Payer: Self-pay

## 2017-09-13 DIAGNOSIS — D229 Melanocytic nevi, unspecified: Secondary | ICD-10-CM | POA: Diagnosis not present

## 2017-09-13 DIAGNOSIS — C44311 Basal cell carcinoma of skin of nose: Secondary | ICD-10-CM | POA: Diagnosis not present

## 2017-10-07 ENCOUNTER — Encounter: Payer: Self-pay | Admitting: Family Medicine

## 2017-10-07 ENCOUNTER — Ambulatory Visit (INDEPENDENT_AMBULATORY_CARE_PROVIDER_SITE_OTHER): Payer: Medicare Other | Admitting: Family Medicine

## 2017-10-07 VITALS — BP 142/80 | HR 68 | Temp 97.7°F | Resp 14 | Ht 60.0 in | Wt 162.0 lb

## 2017-10-07 DIAGNOSIS — I482 Chronic atrial fibrillation, unspecified: Secondary | ICD-10-CM

## 2017-10-07 DIAGNOSIS — E7800 Pure hypercholesterolemia, unspecified: Secondary | ICD-10-CM

## 2017-10-07 LAB — PT WITH INR/FINGERSTICK
INR, fingerstick: 2.3 ratio — ABNORMAL HIGH
PT FINGERSTICK: 27.7 s — AB (ref 10.5–13.1)

## 2017-10-07 NOTE — Progress Notes (Signed)
Subjective:    Patient ID: Kathy [name], female    DOB: 08/22/1927, 84 y.o.   MRN: [id number]  Medication Refill   06/03/17 Here for follow up.  Patient is currently taking 2.5 mg of Coumadin on M/W/F/Sat and 5 mg on Tuesdays/Thursday/ Sunday.   INR is 1.9.  She also has an irregular lesion on the skin covering her right nostril that has been there for almost 2 months.  It is approximately 4 mm in diameter.  It is a bleeding scabbed papule.  It will not heal.  At that time, my plan was: INR is acceptable. No changes in her Coumadin dose. Recheck in 6 weeks  I suspect the lesion on her right nostril is likely a malignant lesion such as an early squamous cell carcinoma.  I gave the patient and the option of referral to a dermatologist for an excisional biopsy versus an attempted cryotherapy with liquid nitrogen.  Patient would like to try cryotherapy first to minimize scarring.  Lesion was treated with liquid nitrogen cryotherapy for a total of 30 seconds.  If the lesion persist, I would recommend dermatology consult for excisional biopsy.  07/15/17 Here for follow up to repeat INR.   Her INR today is therapeutic at 2.3.  She denies any bleeding or bruising.  Her blood pressure is well controlled at 112/64 however she is currently on losartan 100 mg a day.  Her manufacture has started to be recalled and she is concerned about taking this medication moving forward.  The lesion that I treated with cryotherapy at her last visit has shrunk in size and is now a 3 mm scab with no residual erythema.  At that time, my plan was: INR is therapeutic. No change in coumadin.  Monitor for resolution of the scab in 6 weeks.  DC losartan and switch to lisinopril 20 mg poqday.  08/26/17 Here today to recheck INR.  Her INR today is therapeutic at 2.1.  Unfortunately she continues to have a 3 mm papular lesion on the right nostril.  It appears to be a thick scab however it will not heal.  It bleeds occasionally.   It has been present now since treatment in February.  Therefore it has been present for almost 3 months.  This raises concern for underlying squamous cell carcinoma.  I have explained this to the patient and have recommended a dermatology consultation for biopsy and curettage  10/07/17 Patient is here today for follow-up.  Dermatology has recommended that she see a Mohs surgeon.  Appointment is scheduled for August.  I have recommended that the patient contact the surgery office and see if she needs to discontinue her Coumadin prior to her procedure.  Fortunately her INR today is therapeutic at 2.3.  She denies any bleeding or bruising.  She has not had fasting lab work in quite some time.  Past medical history is significant for hypertension, and hyperlipidemia in addition to her atrial fibrillation.  She also has a remote history of prediabetes.  She denies any chest pain shortness of breath or dyspnea on exertion.  She denies any myalgias or right upper quadrant pain.  She does have persistent pain in both shoulders due to arthritis but otherwise is doing well. Past Medical History:  Diagnosis Date  . Arthritis    both shoulders  . Atrial fibrillation ([place])   . Breast cancer ([place])   . Cancer ([place])    left breast  . Cataracts, bilateral   .  Diverticulitis   . Diverticulosis   . Hemorrhoids    internal  . Hiatal hernia   . Hyperlipidemia    takes Zocor daily  . Hypertension    takes Amlodipine daily  . Joint pain    both shoulders  . Macular degeneration 02/2003  . Nasal congestion   . Nocturia   . Obesity   . Osteoporosis   . PONV (postoperative nausea and vomiting)   . Prediabetes   . Shortness of breath    with exertion  . Sinus drainage   . Urinary frequency   . Urinary incontinence    Past Surgical History:  Procedure Laterality Date  . bilateral knee replacements  2008  . BREAST BIOPSY  05/04/2011   Procedure: BREAST BIOPSY WITH NEEDLE LOCALIZATION;  Surgeon: Judieth Keens, DO;  Location: [facility];  Service: General;  Laterality: Left;  . CARDIOVERSION N/A 09/15/2012   Procedure: CARDIOVERSION;  Surgeon: Laverda Page, MD;  Location: [facility];  Service: Cardiovascular;  Laterality: N/A;  h&p in file-HW  . COLONOSCOPY    . ESOPHAGOGASTRODUODENOSCOPY    . HEMORRHOID SURGERY     outpatient "done in doctor's office"  . JOINT REPLACEMENT  2008   bilateral knee replacement  . left breast biopsy  1984  . right breast biopsy  1993    No Known Allergies Social History   Socioeconomic History  . Marital status: Widowed    Spouse name: Not on file  . Number of children: Not on file  . Years of education: Not on file  . Highest education level: Not on file  Occupational History  . Occupation: retired  Scientific laboratory technician  . Financial resource strain: Not on file  . Food insecurity:    Worry: Not on file    Inability: Not on file  . Transportation needs:    Medical: Not on file    Non-medical: Not on file  Tobacco Use  . Smoking status: Former Smoker    Packs/day: 0.25    Years: 10.00    Pack years: 2.50    Types: Cigarettes    Last attempt to quit: 05/21/1971    Years since quitting: 46.4  . Smokeless tobacco: Never Used  . Tobacco comment: quit in the 60's  Substance and Sexual Activity  . Alcohol use: No  . Drug use: No  . Sexual activity: Not Currently  Lifestyle  . Physical activity:    Days per week: Not on file    Minutes per session: Not on file  . Stress: Not on file  Relationships  . Social connections:    Talks on phone: Not on file    Gets together: Not on file    Attends religious service: Not on file    Active member of club or organization: Not on file    Attends meetings of clubs or organizations: Not on file    Relationship status: Not on file  . Intimate partner violence:    Fear of current or ex partner: Not on file    Emotionally abused: Not on file    Physically abused: Not on file    Forced sexual activity: Not  on file  Other Topics Concern  . Not on file  Social History Narrative  . Not on file      Review of Systems  All other systems reviewed and are negative.      Objective:   Physical Exam  Constitutional: She is oriented to person, place,  and time. She appears well-developed. No distress.  Cardiovascular: Normal rate and normal heart sounds. An irregularly irregular rhythm present.  Pulmonary/Chest: Effort normal and breath sounds normal. No respiratory distress. She has no wheezes. She has no rales.  Abdominal: Soft. Bowel sounds are normal.  Neurological: She is alert and oriented to person, place, and time. She has normal reflexes. No cranial nerve deficit. She exhibits normal muscle tone. Coordination normal.  Vitals reviewed.         Assessment & Plan:     Chronic atrial fibrillation ([place]) - Plan: PT with INR/Fingerstick  Pure hypercholesterolemia - Plan: CBC with Differential/Platelet, COMPLETE METABOLIC PANEL WITH GFR, LDL Cholesterol, Direct ' INR is therapeutic.  I will make those changes in her Coumadin and recheck her level in 6 weeks.  Her blood pressure is adequately controlled given her age.  I will check direct LDL, CMP, and a CBC to monitor her management of her hyperlipidemia.  Goal LDL cholesterol would be less than 100.  Otherwise the patient is doing well with no concerns.

## 2017-10-08 LAB — COMPLETE METABOLIC PANEL WITH GFR
AG Ratio: 2 (calc) (ref 1.0–2.5)
ALKALINE PHOSPHATASE (APISO): 52 U/L (ref 33–130)
ALT: 8 U/L (ref 6–29)
AST: 15 U/L (ref 10–35)
Albumin: 4.3 g/dL (ref 3.6–5.1)
BUN/Creatinine Ratio: 22 (calc) (ref 6–22)
BUN: 21 mg/dL (ref 7–25)
CHLORIDE: 103 mmol/L (ref 98–110)
CO2: 27 mmol/L (ref 20–32)
CREATININE: 0.94 mg/dL — AB (ref 0.60–0.88)
Calcium: 9.5 mg/dL (ref 8.6–10.4)
GFR, Est African American: 63 mL/min/{1.73_m2} (ref >=60)
GFR, Est Non African American: 55 mL/min/{1.73_m2} — ABNORMAL LOW (ref >=60)
GLUCOSE: 100 mg/dL — AB (ref 65–99)
Globulin: 2.2 g/dL (calc) (ref 1.9–3.7)
Potassium: 4.5 mmol/L (ref 3.5–5.3)
Sodium: 139 mmol/L (ref 135–146)
Total Bilirubin: 0.9 mg/dL (ref 0.2–1.2)
Total Protein: 6.5 g/dL (ref 6.1–8.1)

## 2017-10-08 LAB — CBC WITH DIFFERENTIAL/PLATELET
BASOS ABS: 53 {cells}/uL (ref 0–200)
Basophils Relative: 0.8 %
EOS PCT: 2.3 %
Eosinophils Absolute: 152 cells/uL (ref 15–500)
HCT: 39.7 % (ref 35.0–45.0)
Hemoglobin: 13.4 g/dL (ref 11.7–15.5)
Lymphs Abs: 2693 cells/uL (ref 850–3900)
MCH: 32.5 pg (ref 27.0–33.0)
MCHC: 33.8 g/dL (ref 32.0–36.0)
MCV: 96.4 fL (ref 80.0–100.0)
MONOS PCT: 7.6 %
MPV: 10.5 fL (ref 7.5–12.5)
NEUTROS ABS: 3201 {cells}/uL (ref 1500–7800)
NEUTROS PCT: 48.5 %
Platelets: 190 10*3/uL (ref 140–400)
RBC: 4.12 10*6/uL (ref 3.80–5.10)
RDW: 12.8 % (ref 11.0–15.0)
Total Lymphocyte: 40.8 %
WBC mixed population: 502 cells/uL (ref 200–950)
WBC: 6.6 10*3/uL (ref 3.8–10.8)

## 2017-10-08 LAB — LDL CHOLESTEROL, DIRECT: LDL DIRECT: 46 mg/dL (ref <100)

## 2017-11-18 ENCOUNTER — Ambulatory Visit (INDEPENDENT_AMBULATORY_CARE_PROVIDER_SITE_OTHER): Payer: Medicare Other | Admitting: Family Medicine

## 2017-11-18 ENCOUNTER — Encounter: Payer: Self-pay | Admitting: Family Medicine

## 2017-11-18 DIAGNOSIS — I482 Chronic atrial fibrillation, unspecified: Secondary | ICD-10-CM

## 2017-11-18 LAB — PT WITH INR/FINGERSTICK
INR FINGERSTICK: 2.5 ratio — AB
PT, fingerstick: 29.7 s — ABNORMAL HIGH (ref 10.5–13.1)

## 2017-11-18 NOTE — Progress Notes (Signed)
Subjective:    Patient ID: Kathy [name], female    DOB: 11-15-1927, 84 y.o.   MRN: [id number]  Medication Refill   06/03/17 Here for follow up.  Patient is currently taking 2.5 mg of Coumadin on M/W/F/Sat and 5 mg on Tuesdays/Thursday/ Sunday.   INR is 1.9.  She also has an irregular lesion on the skin covering her right nostril that has been there for almost 2 months.  It is approximately 4 mm in diameter.  It is a bleeding scabbed papule.  It will not heal.  At that time, my plan was: INR is acceptable. No changes in her Coumadin dose. Recheck in 6 weeks  I suspect the lesion on her right nostril is likely a malignant lesion such as an early squamous cell carcinoma.  I gave the patient and the option of referral to a dermatologist for an excisional biopsy versus an attempted cryotherapy with liquid nitrogen.  Patient would like to try cryotherapy first to minimize scarring.  Lesion was treated with liquid nitrogen cryotherapy for a total of 30 seconds.  If the lesion persist, I would recommend dermatology consult for excisional biopsy.  07/15/17 Here for follow up to repeat INR.   Her INR today is therapeutic at 2.3.  She denies any bleeding or bruising.  Her blood pressure is well controlled at 112/64 however she is currently on losartan 100 mg a day.  Her manufacture has started to be recalled and she is concerned about taking this medication moving forward.  The lesion that I treated with cryotherapy at her last visit has shrunk in size and is now a 3 mm scab with no residual erythema.  At that time, my plan was: INR is therapeutic. No change in coumadin.  Monitor for resolution of the scab in 6 weeks.  DC losartan and switch to lisinopril 20 mg poqday.  08/26/17 Here today to recheck INR.  Her INR today is therapeutic at 2.1.  Unfortunately she continues to have a 3 mm papular lesion on the right nostril.  It appears to be a thick scab however it will not heal.  It bleeds occasionally.   It has been present now since treatment in February.  Therefore it has been present for almost 3 months.  This raises concern for underlying squamous cell carcinoma.  I have explained this to the patient and have recommended a dermatology consultation for biopsy and curettage  10/07/17 Patient is here today for follow-up.  Dermatology has recommended that she see a Mohs surgeon.  Appointment is scheduled for August.  I have recommended that the patient contact the surgery office and see if she needs to discontinue her Coumadin prior to her procedure.  Fortunately her INR today is therapeutic at 2.3.  She denies any bleeding or bruising.  She has not had fasting lab work in quite some time.  Past medical history is significant for hypertension, and hyperlipidemia in addition to her atrial fibrillation.  She also has a remote history of prediabetes.  She denies any chest pain shortness of breath or dyspnea on exertion.  She denies any myalgias or right upper quadrant pain.  She does have persistent pain in both shoulders due to arthritis but otherwise is doing well.  At that time, my plan was:  INR is therapeutic.  I will make no changes in her Coumadin and recheck her level in 6 weeks.  Her blood pressure is adequately controlled given her age.  I  will check direct LDL, CMP, and a CBC to monitor her management of her hyperlipidemia.  Goal LDL cholesterol would be less than 100.  Otherwise the patient is doing well with no concerns.  11/18/17 Patient has an appointment on August 6 with MOHS surgery to remove the lesion on her right nostril.  She is here today to recheck her INR.  Her INR today is therapeutic at 2.5. Past Medical History:  Diagnosis Date  . Arthritis    both shoulders  . Atrial fibrillation ([place])   . Breast cancer ([place])   . Cancer ([place])    left breast  . Cataracts, bilateral   . Diverticulitis   . Diverticulosis   . Hemorrhoids    internal  . Hiatal hernia   . Hyperlipidemia     takes Zocor daily  . Hypertension    takes Amlodipine daily  . Joint pain    both shoulders  . Macular degeneration 02/2003  . Nasal congestion   . Nocturia   . Obesity   . Osteoporosis   . PONV (postoperative nausea and vomiting)   . Prediabetes   . Shortness of breath    with exertion  . Sinus drainage   . Urinary frequency   . Urinary incontinence    Past Surgical History:  Procedure Laterality Date  . bilateral knee replacements  2008  . BREAST BIOPSY  05/04/2011   Procedure: BREAST BIOPSY WITH NEEDLE LOCALIZATION;  Surgeon: Judieth Keens, DO;  Location: [facility];  Service: General;  Laterality: Left;  . CARDIOVERSION N/A 09/15/2012   Procedure: CARDIOVERSION;  Surgeon: Laverda Page, MD;  Location: [facility];  Service: Cardiovascular;  Laterality: N/A;  h&p in file-HW  . COLONOSCOPY    . ESOPHAGOGASTRODUODENOSCOPY    . HEMORRHOID SURGERY     outpatient "done in doctor's office"  . JOINT REPLACEMENT  2008   bilateral knee replacement  . left breast biopsy  1984  . right breast biopsy  1993    No Known Allergies Social History   Socioeconomic History  . Marital status: Widowed    Spouse name: Not on file  . Number of children: Not on file  . Years of education: Not on file  . Highest education level: Not on file  Occupational History  . Occupation: retired  Scientific laboratory technician  . Financial resource strain: Not on file  . Food insecurity:    Worry: Not on file    Inability: Not on file  . Transportation needs:    Medical: Not on file    Non-medical: Not on file  Tobacco Use  . Smoking status: Former Smoker    Packs/day: 0.25    Years: 10.00    Pack years: 2.50    Types: Cigarettes    Last attempt to quit: 05/21/1971    Years since quitting: 46.5  . Smokeless tobacco: Never Used  . Tobacco comment: quit in the 60's  Substance and Sexual Activity  . Alcohol use: No  . Drug use: No  . Sexual activity: Not Currently  Lifestyle  . Physical activity:     Days per week: Not on file    Minutes per session: Not on file  . Stress: Not on file  Relationships  . Social connections:    Talks on phone: Not on file    Gets together: Not on file    Attends religious service: Not on file    Active member of club or organization: Not on file  Attends meetings of clubs or organizations: Not on file    Relationship status: Not on file  . Intimate partner violence:    Fear of current or ex partner: Not on file    Emotionally abused: Not on file    Physically abused: Not on file    Forced sexual activity: Not on file  Other Topics Concern  . Not on file  Social History Narrative  . Not on file      Review of Systems  All other systems reviewed and are negative.      Objective:   Physical Exam  Constitutional: She is oriented to person, place, and time. She appears well-developed. No distress.  Cardiovascular: Normal rate and normal heart sounds. An irregularly irregular rhythm present.  Pulmonary/Chest: Effort normal and breath sounds normal. No respiratory distress. She has no wheezes. She has no rales.  Abdominal: Soft. Bowel sounds are normal.  Neurological: She is alert and oriented to person, place, and time. She has normal reflexes. No cranial nerve deficit. She exhibits normal muscle tone. Coordination normal.  Vitals reviewed.         Assessment & Plan:      Chronic atrial fibrillation ([place]) - Plan: PT with INR/Fingerstick  I have not received communication from the dermatologist however I have recommended that the patient discontinue Coumadin 4 days prior to surgery.  She could then resume the Coumadin after surgery.  I wrote this on a letter including the patient's INR and faxed it to the dermatologist.  If this is not sufficient they can let us know prior to surgery.  I would be willing to allow the patient to come off Coumadin for 1 week prior to surgery if necessary but I believe 4 days should be sufficient for minor  surgery.

## 2017-11-30 DIAGNOSIS — C44311 Basal cell carcinoma of skin of nose: Secondary | ICD-10-CM | POA: Diagnosis not present

## 2017-12-08 ENCOUNTER — Other Ambulatory Visit: Payer: Self-pay | Admitting: Family Medicine

## 2017-12-30 ENCOUNTER — Ambulatory Visit (INDEPENDENT_AMBULATORY_CARE_PROVIDER_SITE_OTHER): Payer: Medicare Other | Admitting: Family Medicine

## 2017-12-30 ENCOUNTER — Encounter: Payer: Self-pay | Admitting: Family Medicine

## 2017-12-30 VITALS — BP 122/70 | HR 56 | Temp 98.4°F | Resp 16 | Wt 163.0 lb

## 2017-12-30 DIAGNOSIS — I482 Chronic atrial fibrillation, unspecified: Secondary | ICD-10-CM

## 2017-12-30 DIAGNOSIS — Z23 Encounter for immunization: Secondary | ICD-10-CM | POA: Diagnosis not present

## 2017-12-30 LAB — PT WITH INR/FINGERSTICK
INR, fingerstick: 2.6 ratio — ABNORMAL HIGH
PT, fingerstick: 31 s — ABNORMAL HIGH (ref 10.5–13.1)

## 2017-12-30 NOTE — Addendum Note (Signed)
Addended by: Shary Decamp B on: 12/30/2017 04:50 PM   Modules accepted: Orders

## 2017-12-30 NOTE — Progress Notes (Signed)
Subjective:    Patient ID: Kathy [name], female    DOB: Mar 12, 1928, 84 y.o.   MRN: [id number]  Medication Refill   Patient is here today to recheck her INR.  She recently had MOHS to remove a cancerous lesion from her right nostril.  She was able to do the surgery without discontinuing Coumadin.  She did extremely well and I am extremely impressed by how well the site is healing.  The surgeon did an amazing job and you can hardly tell that anything has been done.  Patient is very pleased  INR is 2.6.  Currently taking 5 mg of Coumadin on Tuesday, Thursday, Saturday, Sunday.  Taking 2.5 mg of Coumadin on Monday Wednesday and Friday.  She denies any bleeding or bruising Current Outpatient Medications on File Prior to Visit  Medication Sig Dispense Refill  . Calcium Carbonate-Vitamin D (CALTRATE 600+D) 600-400 MG-UNIT per tablet Take 1 tablet by mouth 2 (two) times daily.      . cholecalciferol (VITAMIN D) 400 UNITS TABS Take 400 Units by mouth daily. Take in addition to Caltrate with Vitamin D to get 1200 units of Vitamin D    . digoxin (LANOXIN) 0.125 MG tablet TAKE 1 TABLET BY MOUTH ONCE DAILY NONE  ON  SAT  OR  SUN 30 tablet 11  . hydrochlorothiazide (HYDRODIURIL) 25 MG tablet Take 1 tablet (25 mg total) by mouth daily. 90 tablet 3  . HYDROcodone-acetaminophen (NORCO) 5-325 MG tablet Take 1 tablet by mouth every 6 (six) hours as needed for moderate pain. 60 tablet 0  . lisinopril (PRINIVIL,ZESTRIL) 20 MG tablet Take 1 tablet (20 mg total) by mouth daily. This replaces losartan 90 tablet 3  . magnesium gluconate (MAGONATE) 500 MG tablet Take 500 mg by mouth 2 (two) times daily.    . metoprolol tartrate (LOPRESSOR) 50 MG tablet TAKE 1 TABLET BY MOUTH TWICE DAILY 60 tablet 11  . simvastatin (ZOCOR) 20 MG tablet TAKE ONE TABLET BY MOUTH AT BEDTIME 90 tablet 3  . vitamin B-12 (CYANOCOBALAMIN) 1000 MCG tablet Take 1,000 mcg by mouth daily.    Marland Kitchen warfarin (COUMADIN) 5 MG tablet Take 2 tablets  (10 mg total) by mouth daily. (Patient taking differently: Take 10 mg by mouth daily. 07/31/16 2.5 mg M-W-F, 5 MG T-TH-SAT -SU) 60 tablet 2  . warfarin (COUMADIN) 5 MG tablet TAKE 2 TABLETS BY MOUTH ONCE DAILY 60 tablet 11   No current facility-administered medications on file prior to visit.     Past Medical History:  Diagnosis Date  . Arthritis    both shoulders  . Atrial fibrillation ([place])   . Breast cancer ([place])   . Cancer ([place])    left breast  . Cataracts, bilateral   . Diverticulitis   . Diverticulosis   . Hemorrhoids    internal  . Hiatal hernia   . Hyperlipidemia    takes Zocor daily  . Hypertension    takes Amlodipine daily  . Joint pain    both shoulders  . Macular degeneration 02/2003  . Nasal congestion   . Nocturia   . Obesity   . Osteoporosis   . PONV (postoperative nausea and vomiting)   . Prediabetes   . Shortness of breath    with exertion  . Sinus drainage   . Urinary frequency   . Urinary incontinence    Past Surgical History:  Procedure Laterality Date  . bilateral knee replacements  2008  . BREAST BIOPSY  05/04/2011   Procedure: BREAST BIOPSY WITH NEEDLE LOCALIZATION;  Surgeon: Judieth Keens, DO;  Location: [facility];  Service: General;  Laterality: Left;  . CARDIOVERSION N/A 09/15/2012   Procedure: CARDIOVERSION;  Surgeon: Laverda Page, MD;  Location: [facility];  Service: Cardiovascular;  Laterality: N/A;  h&p in file-HW  . COLONOSCOPY    . ESOPHAGOGASTRODUODENOSCOPY    . HEMORRHOID SURGERY     outpatient "done in doctor's office"  . JOINT REPLACEMENT  2008   bilateral knee replacement  . left breast biopsy  1984  . right breast biopsy  1993    No Known Allergies Social History   Socioeconomic History  . Marital status: Widowed    Spouse name: Not on file  . Number of children: Not on file  . Years of education: Not on file  . Highest education level: Not on file  Occupational History  . Occupation: retired  Scientific laboratory technician  .  Financial resource strain: Not on file  . Food insecurity:    Worry: Not on file    Inability: Not on file  . Transportation needs:    Medical: Not on file    Non-medical: Not on file  Tobacco Use  . Smoking status: Former Smoker    Packs/day: 0.25    Years: 10.00    Pack years: 2.50    Types: Cigarettes    Last attempt to quit: 05/21/1971    Years since quitting: 46.6  . Smokeless tobacco: Never Used  . Tobacco comment: quit in the 60's  Substance and Sexual Activity  . Alcohol use: No  . Drug use: No  . Sexual activity: Not Currently  Lifestyle  . Physical activity:    Days per week: Not on file    Minutes per session: Not on file  . Stress: Not on file  Relationships  . Social connections:    Talks on phone: Not on file    Gets together: Not on file    Attends religious service: Not on file    Active member of club or organization: Not on file    Attends meetings of clubs or organizations: Not on file    Relationship status: Not on file  . Intimate partner violence:    Fear of current or ex partner: Not on file    Emotionally abused: Not on file    Physically abused: Not on file    Forced sexual activity: Not on file  Other Topics Concern  . Not on file  Social History Narrative  . Not on file      Review of Systems  All other systems reviewed and are negative.      Objective:   Physical Exam  Constitutional: She is oriented to person, place, and time. She appears well-developed. No distress.  Cardiovascular: Normal rate and normal heart sounds. An irregularly irregular rhythm present.  Pulmonary/Chest: Effort normal and breath sounds normal. No respiratory distress. She has no wheezes. She has no rales.  Abdominal: Soft. Bowel sounds are normal.  Neurological: She is alert and oriented to person, place, and time. She has normal reflexes. No cranial nerve deficit. She exhibits normal muscle tone. Coordination normal.  Vitals reviewed.           Assessment & Plan:      Atrial fibrillation.  INR is therapeutic.  Continue current dose of Coumadin and recheck INR in 6 weeks.  Patient received her flu shot today

## 2018-02-09 DIAGNOSIS — Z853 Personal history of malignant neoplasm of breast: Secondary | ICD-10-CM | POA: Diagnosis not present

## 2018-02-09 DIAGNOSIS — Z1231 Encounter for screening mammogram for malignant neoplasm of breast: Secondary | ICD-10-CM | POA: Diagnosis not present

## 2018-02-10 ENCOUNTER — Ambulatory Visit (INDEPENDENT_AMBULATORY_CARE_PROVIDER_SITE_OTHER): Payer: Medicare Other | Admitting: Family Medicine

## 2018-02-10 ENCOUNTER — Encounter: Payer: Self-pay | Admitting: Family Medicine

## 2018-02-10 VITALS — BP 110/74 | HR 68 | Temp 98.1°F | Resp 18 | Ht 60.0 in | Wt 162.0 lb

## 2018-02-10 DIAGNOSIS — I482 Chronic atrial fibrillation, unspecified: Secondary | ICD-10-CM | POA: Diagnosis not present

## 2018-02-10 LAB — PT WITH INR/FINGERSTICK
INR, fingerstick: 2.7 ratio — ABNORMAL HIGH
PT, fingerstick: 32.2 s — ABNORMAL HIGH (ref 10.5–13.1)

## 2018-02-10 NOTE — Progress Notes (Signed)
Subjective:    Patient ID: Kathy [name], female    DOB: 12-Jun-1927, 84 y.o.   MRN: [id number]  Medication Refill   Patient is here today to recheck her INR. INR is 2.7.  Currently taking 5 mg of Coumadin on Tuesday, Thursday, Saturday, Sunday.  Taking 2.5 mg of Coumadin on Monday Wednesday and Friday.  She denies any bleeding or bruising. She denies sob, doe, chest pain or palpitations.   Current Outpatient Medications on File Prior to Visit  Medication Sig Dispense Refill  . Calcium Carbonate-Vitamin D (CALTRATE 600+D) 600-400 MG-UNIT per tablet Take 1 tablet by mouth 2 (two) times daily.      . cholecalciferol (VITAMIN D) 400 UNITS TABS Take 400 Units by mouth daily. Take in addition to Caltrate with Vitamin D to get 1200 units of Vitamin D    . digoxin (LANOXIN) 0.125 MG tablet TAKE 1 TABLET BY MOUTH ONCE DAILY NONE  ON  SAT  OR  SUN 30 tablet 11  . hydrochlorothiazide (HYDRODIURIL) 25 MG tablet Take 1 tablet (25 mg total) by mouth daily. 90 tablet 3  . HYDROcodone-acetaminophen (NORCO) 5-325 MG tablet Take 1 tablet by mouth every 6 (six) hours as needed for moderate pain. 60 tablet 0  . lisinopril (PRINIVIL,ZESTRIL) 20 MG tablet Take 1 tablet (20 mg total) by mouth daily. This replaces losartan 90 tablet 3  . magnesium gluconate (MAGONATE) 500 MG tablet Take 500 mg by mouth 2 (two) times daily.    . metoprolol tartrate (LOPRESSOR) 50 MG tablet TAKE 1 TABLET BY MOUTH TWICE DAILY 60 tablet 11  . simvastatin (ZOCOR) 20 MG tablet TAKE ONE TABLET BY MOUTH AT BEDTIME 90 tablet 3  . vitamin B-12 (CYANOCOBALAMIN) 1000 MCG tablet Take 1,000 mcg by mouth daily.    Marland Kitchen warfarin (COUMADIN) 5 MG tablet Take 2 tablets (10 mg total) by mouth daily. (Patient taking differently: Take 10 mg by mouth daily. 07/31/16 2.5 mg M-W-F, 5 MG T-TH-SAT -SU) 60 tablet 2  . warfarin (COUMADIN) 5 MG tablet TAKE 2 TABLETS BY MOUTH ONCE DAILY 60 tablet 11   No current facility-administered medications on file prior  to visit.     Past Medical History:  Diagnosis Date  . Arthritis    both shoulders  . Atrial fibrillation ([place])   . Breast cancer ([place])   . Cancer ([place])    left breast  . Cataracts, bilateral   . Diverticulitis   . Diverticulosis   . Hemorrhoids    internal  . Hiatal hernia   . Hyperlipidemia    takes Zocor daily  . Hypertension    takes Amlodipine daily  . Joint pain    both shoulders  . Macular degeneration 02/2003  . Nasal congestion   . Nocturia   . Obesity   . Osteoporosis   . PONV (postoperative nausea and vomiting)   . Prediabetes   . Shortness of breath    with exertion  . Sinus drainage   . Urinary frequency   . Urinary incontinence    Past Surgical History:  Procedure Laterality Date  . bilateral knee replacements  2008  . BREAST BIOPSY  05/04/2011   Procedure: BREAST BIOPSY WITH NEEDLE LOCALIZATION;  Surgeon: Judieth Keens, DO;  Location: [facility];  Service: General;  Laterality: Left;  . CARDIOVERSION N/A 09/15/2012   Procedure: CARDIOVERSION;  Surgeon: Laverda Page, MD;  Location: [facility];  Service: Cardiovascular;  Laterality: N/A;  h&p in file-HW  .  COLONOSCOPY    . ESOPHAGOGASTRODUODENOSCOPY    . HEMORRHOID SURGERY     outpatient "done in doctor's office"  . JOINT REPLACEMENT  2008   bilateral knee replacement  . left breast biopsy  1984  . right breast biopsy  1993    No Known Allergies Social History   Socioeconomic History  . Marital status: Widowed    Spouse name: Not on file  . Number of children: Not on file  . Years of education: Not on file  . Highest education level: Not on file  Occupational History  . Occupation: retired  Scientific laboratory technician  . Financial resource strain: Not on file  . Food insecurity:    Worry: Not on file    Inability: Not on file  . Transportation needs:    Medical: Not on file    Non-medical: Not on file  Tobacco Use  . Smoking status: Former Smoker    Packs/day: 0.25    Years: 10.00    Pack  years: 2.50    Types: Cigarettes    Last attempt to quit: 05/21/1971    Years since quitting: 46.7  . Smokeless tobacco: Never Used  . Tobacco comment: quit in the 60's  Substance and Sexual Activity  . Alcohol use: No  . Drug use: No  . Sexual activity: Not Currently  Lifestyle  . Physical activity:    Days per week: Not on file    Minutes per session: Not on file  . Stress: Not on file  Relationships  . Social connections:    Talks on phone: Not on file    Gets together: Not on file    Attends religious service: Not on file    Active member of club or organization: Not on file    Attends meetings of clubs or organizations: Not on file    Relationship status: Not on file  . Intimate partner violence:    Fear of current or ex partner: Not on file    Emotionally abused: Not on file    Physically abused: Not on file    Forced sexual activity: Not on file  Other Topics Concern  . Not on file  Social History Narrative  . Not on file      Review of Systems  All other systems reviewed and are negative.      Objective:   Physical Exam  Constitutional: She is oriented to person, place, and time. She appears well-developed. No distress.  Cardiovascular: Normal rate and normal heart sounds. An irregularly irregular rhythm present.  Pulmonary/Chest: Effort normal and breath sounds normal. No respiratory distress. She has no wheezes. She has no rales.  Abdominal: Soft. Bowel sounds are normal.  Neurological: She is alert and oriented to person, place, and time. She has normal reflexes. No cranial nerve deficit. She exhibits normal muscle tone. Coordination normal.  Vitals reviewed.         Assessment & Plan:    Chronic atrial fibrillation - Plan: PT with INR/Fingerstick, PT with INR/Fingerstick  Therapeutic INR.  No change in dosage of coumadin and recheck in 6 weeks.

## 2018-02-19 IMAGING — DX DG CHEST 2V
2 series · 2 of 2 positions shown · non-contrast
Comparison: PA and lateral chest 04/30/2011.

CLINICAL DATA: Dizziness beginning this morning. Initial encounter.

EXAM:
CHEST  2 VIEW

[chest pa]
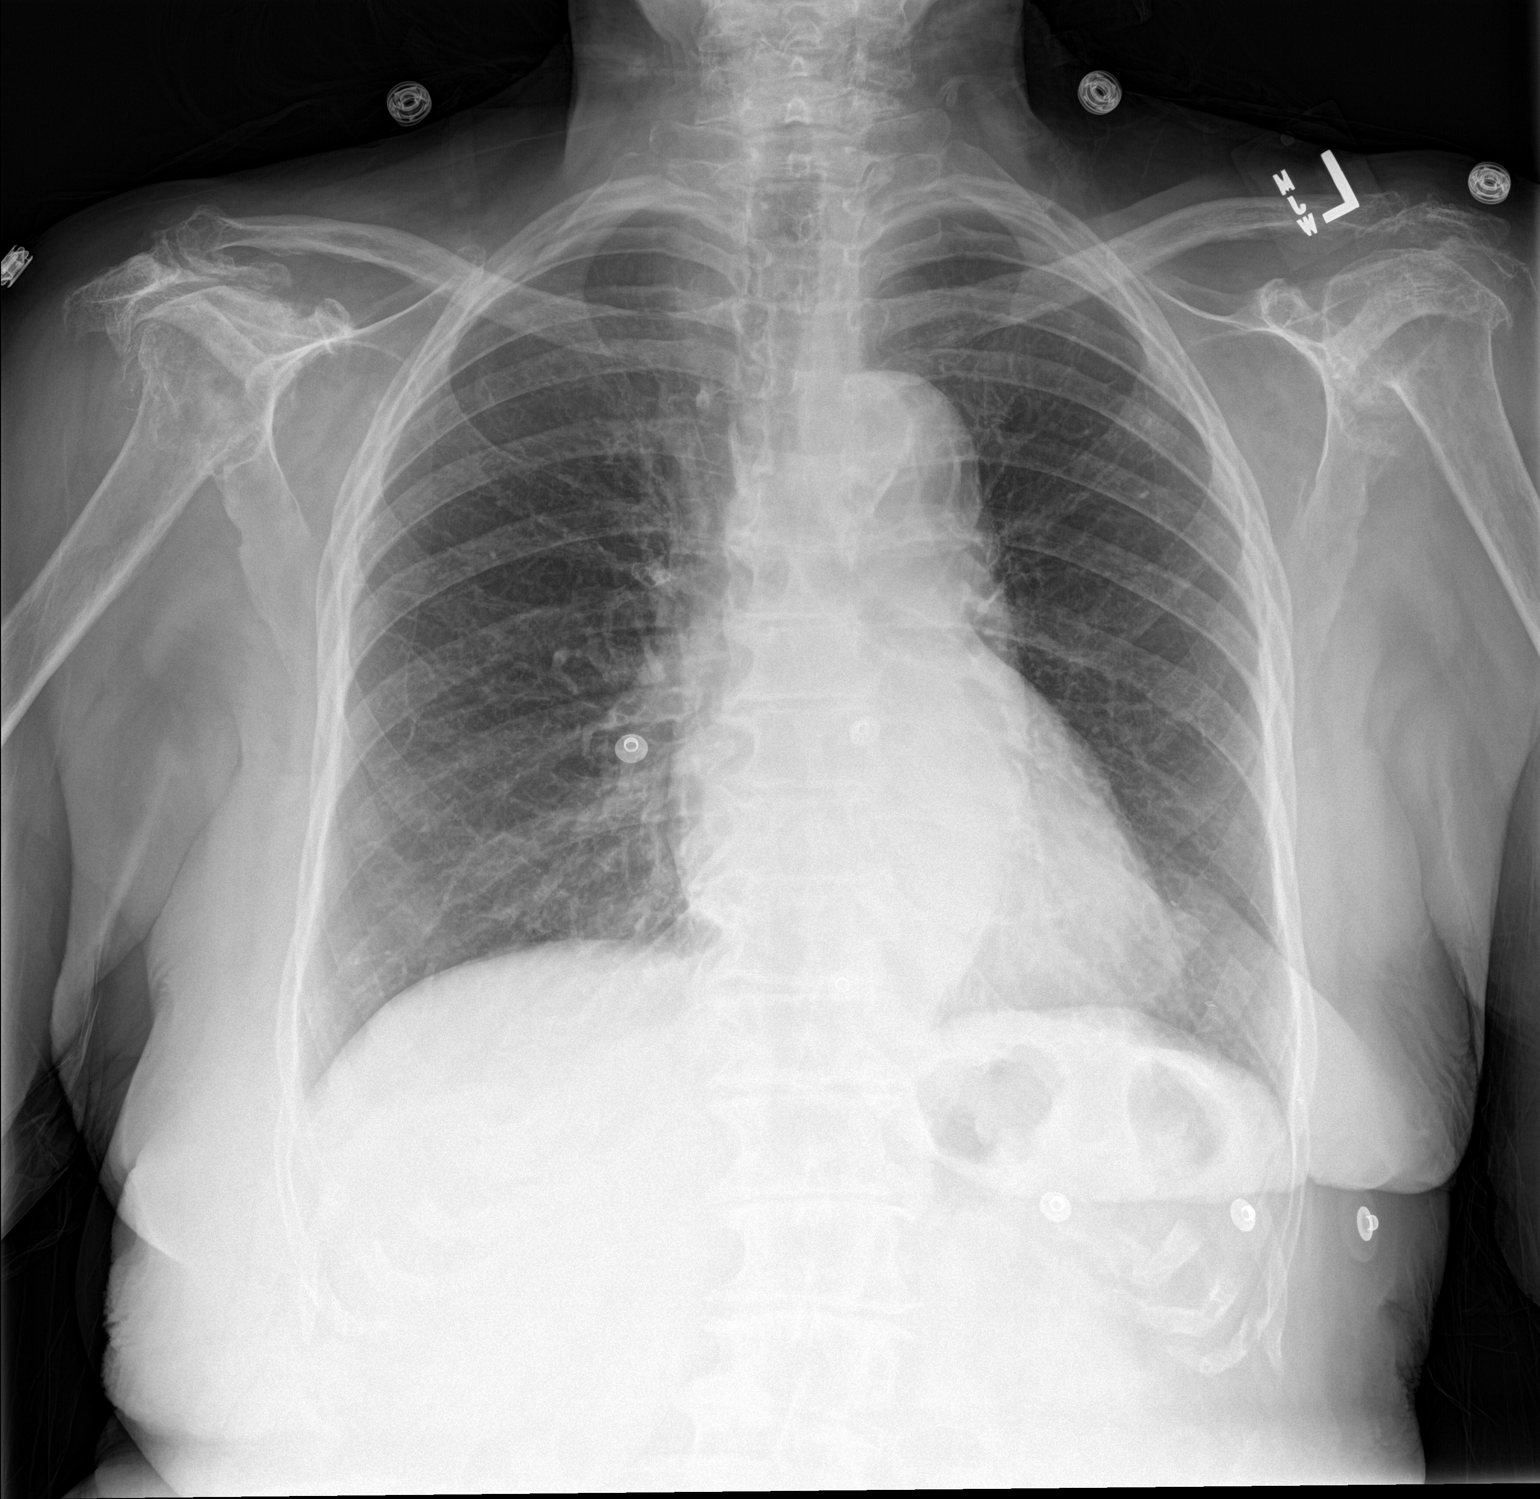

[chest lat]
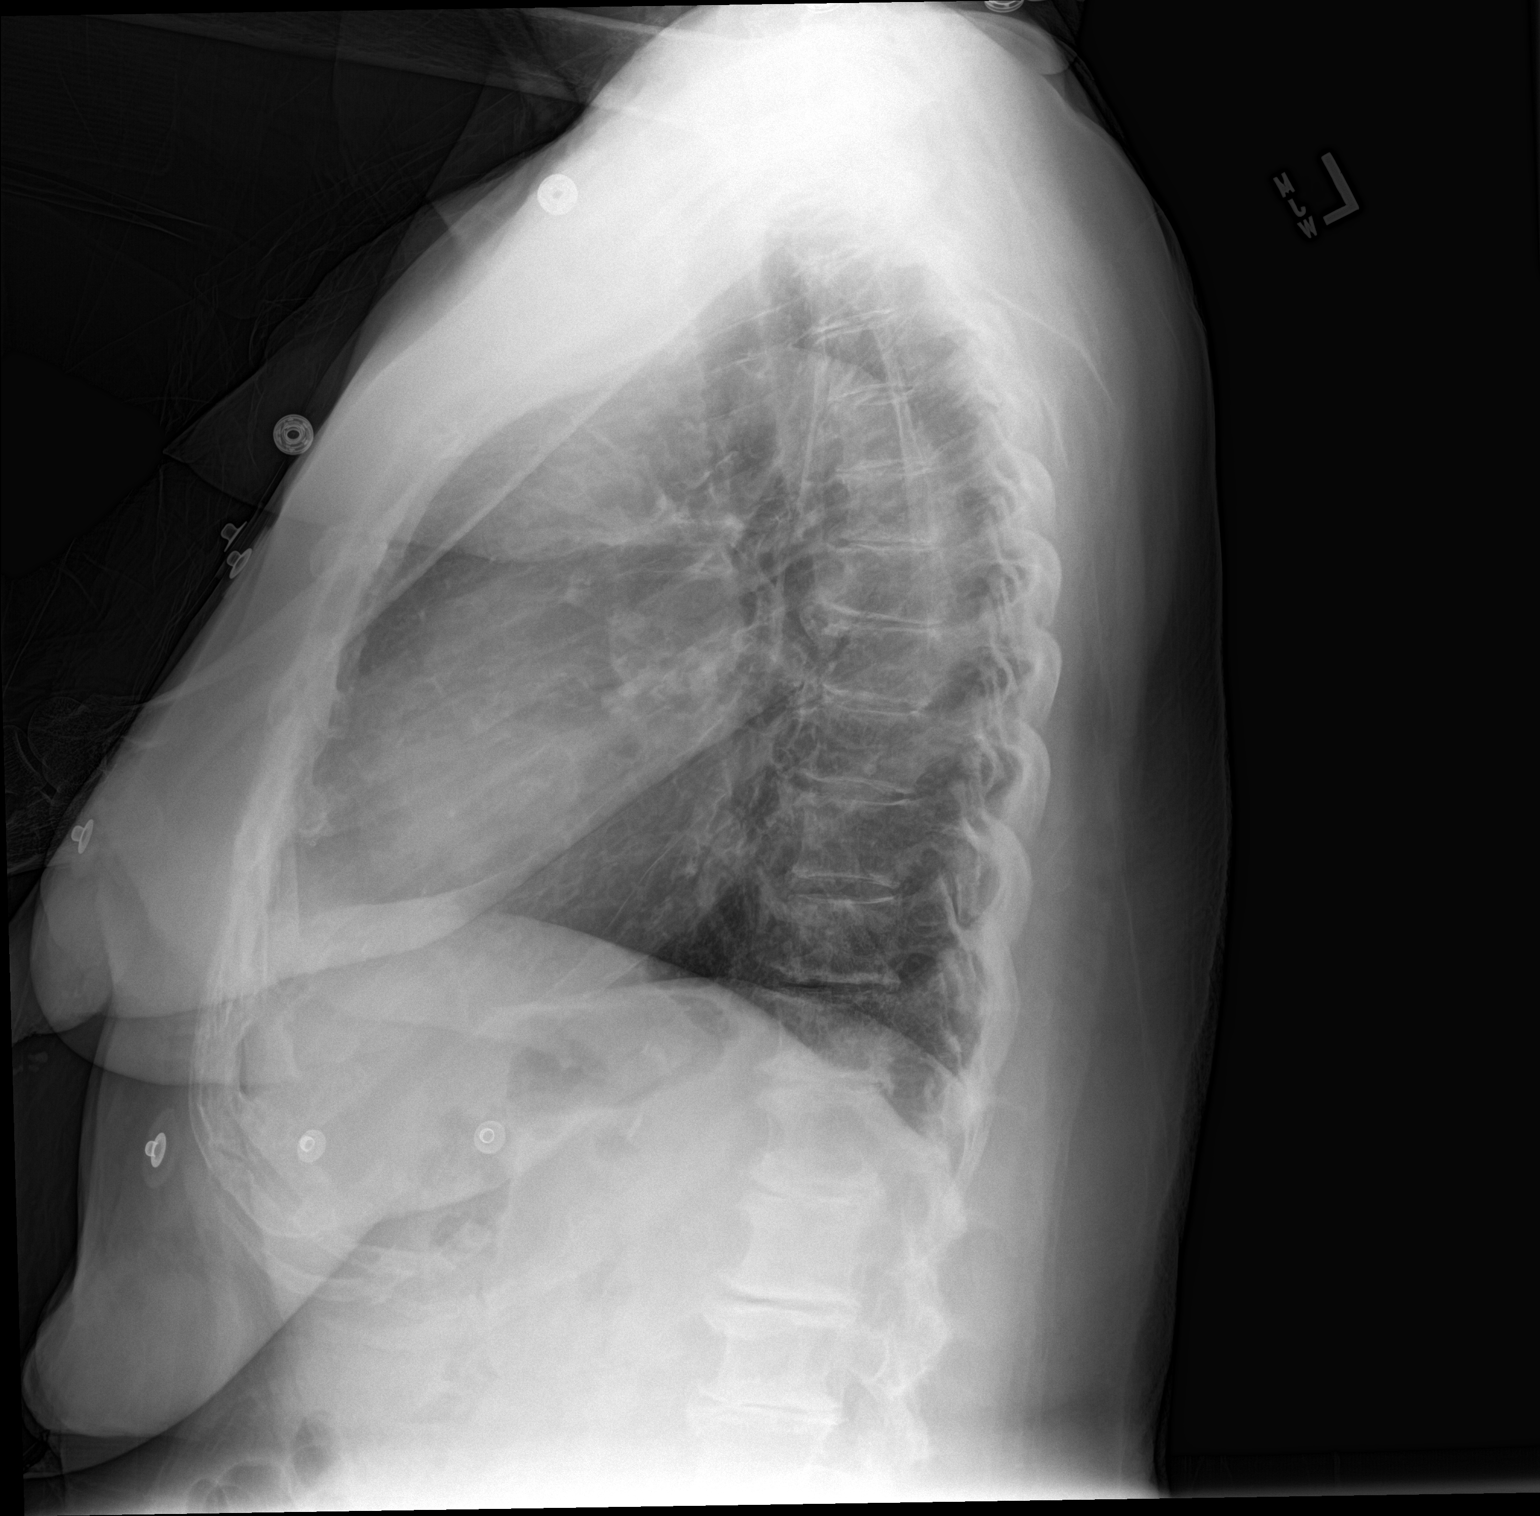

[2 of 2 positions shown; findings below may reference images not displayed]

FINDINGS: The lungs are clear. Heart size is normal. No pneumothorax or
pleural effusion. No focal bony abnormality. Multilevel thoracic and
lumbar spondylosis and severe degenerative disease about the
shoulders is noted.
IMPRESSION: No acute disease.

## 2018-02-19 IMAGING — CT CT HEAD W/O CM
2 series · 15 of 30 positions shown, 17 images · non-contrast
Comparison: None.

CLINICAL DATA: Dizziness, right-sided weakness.

EXAM:
CT HEAD WITHOUT CONTRAST
TECHNIQUE: Contiguous axial images were obtained from the base of the skull
through the vertex without intravenous contrast.

[Series 2: head without · axial · non-contrast · 0.42mm/px · z∈[+11,+116]mm · 7 of 29 slices shown, 9 images]
[im 4/29  brain]
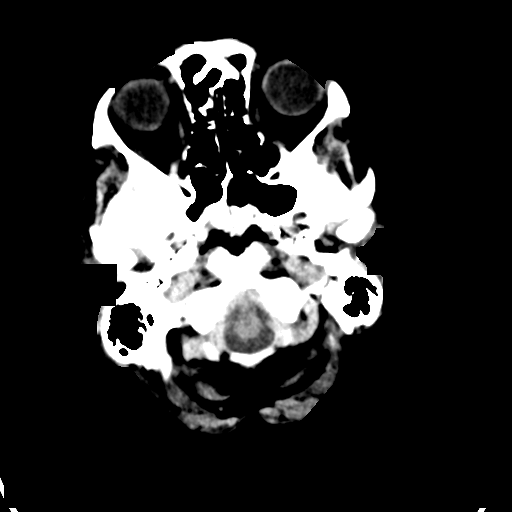
[im 4/29  bone]
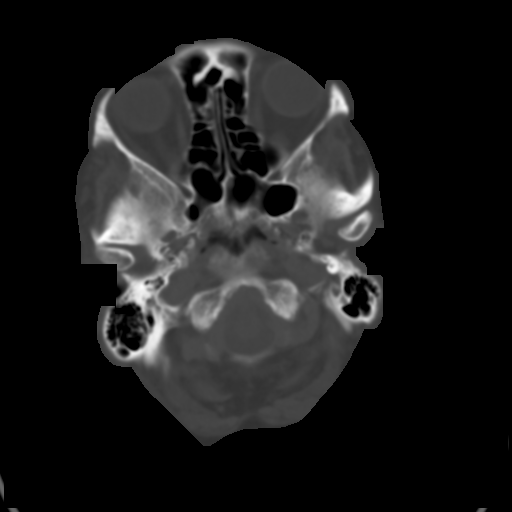
[im 8/29  brain]
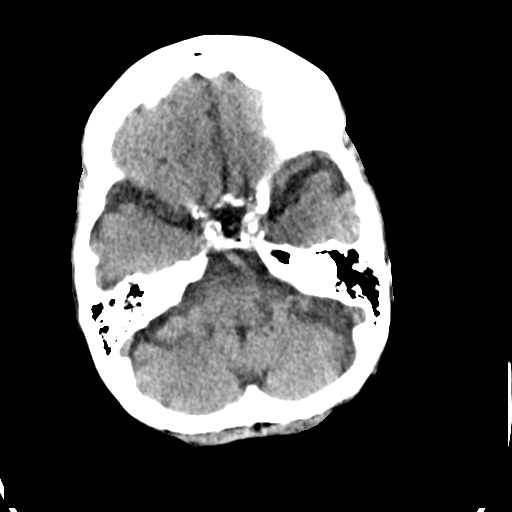
[im 11/29  brain]
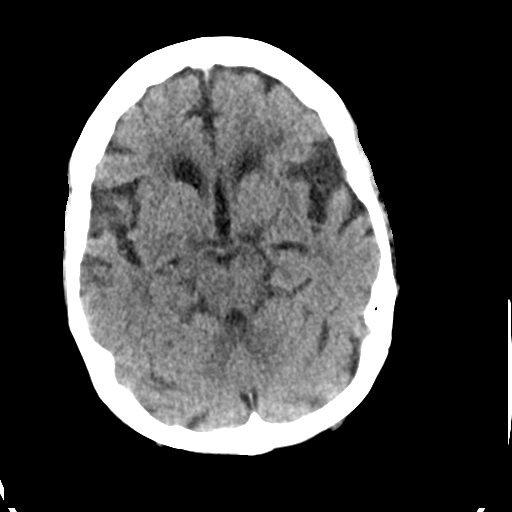
[im 15/29  brain]
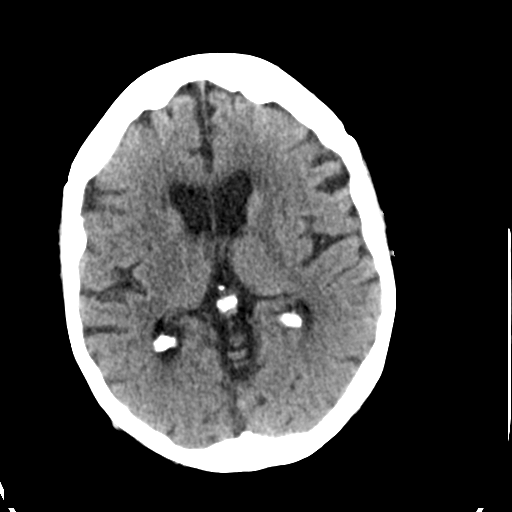
[im 18/29  brain]
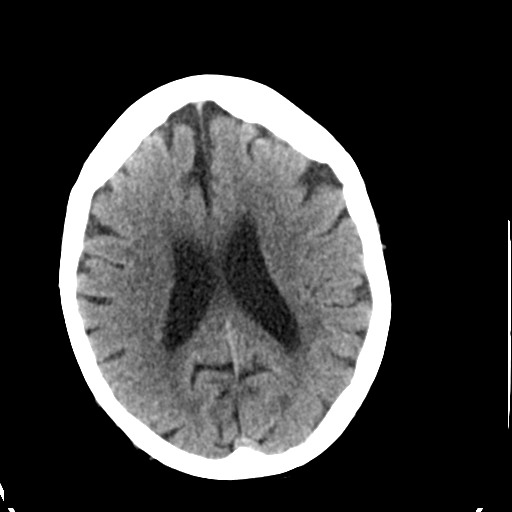
[im 18/29  bone]
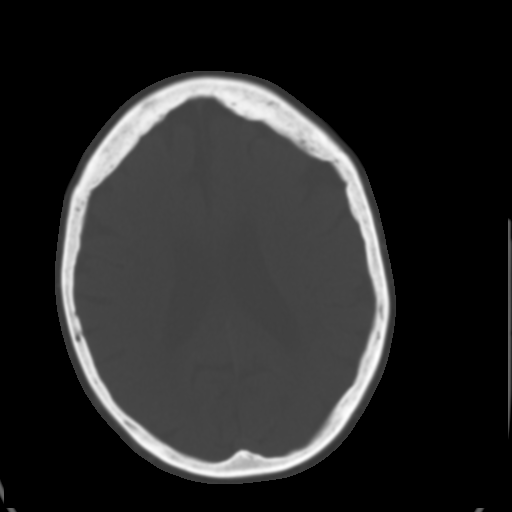
[im 22/29  brain]
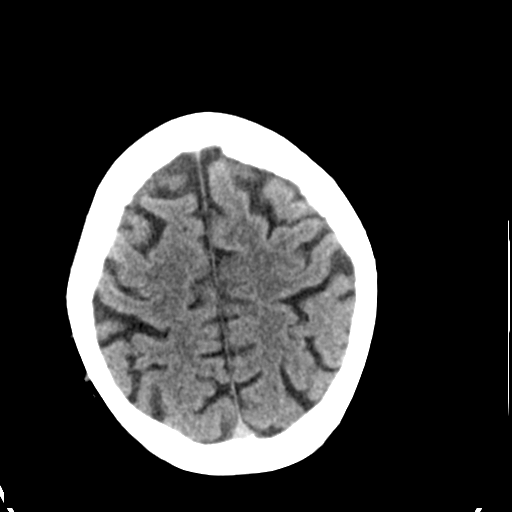
[im 25/29  brain]
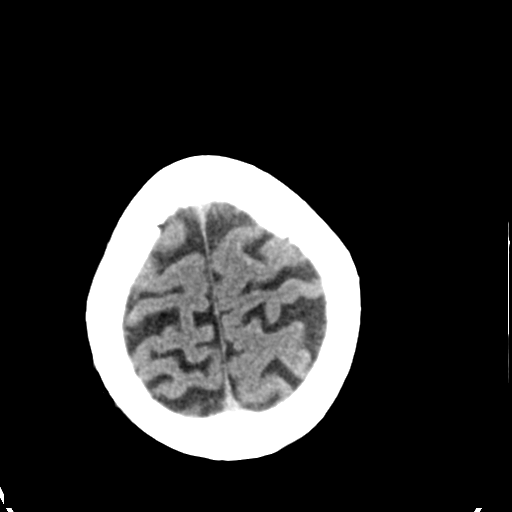

[Series 3: head bone · axial · 0.42mm/px · z∈[+10,+122]mm · 8 of 72 slices shown]
[im 8/72  bone]
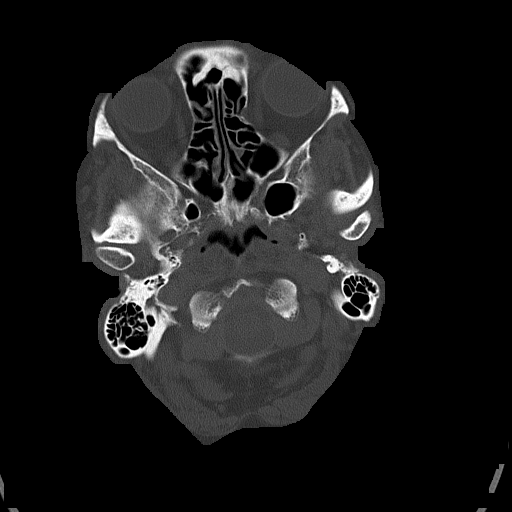
[im 15/72  bone]
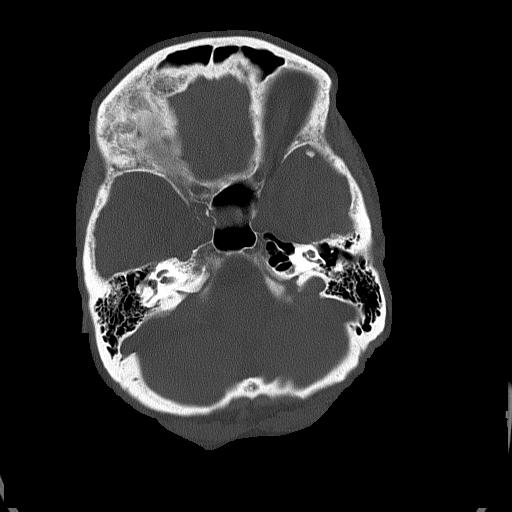
[im 22/72  bone]
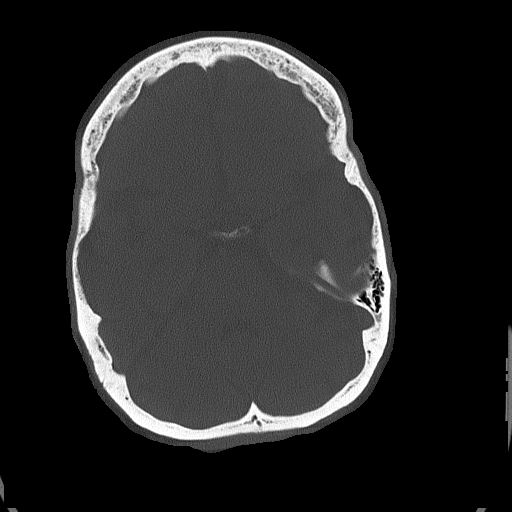
[im 32/72  bone]
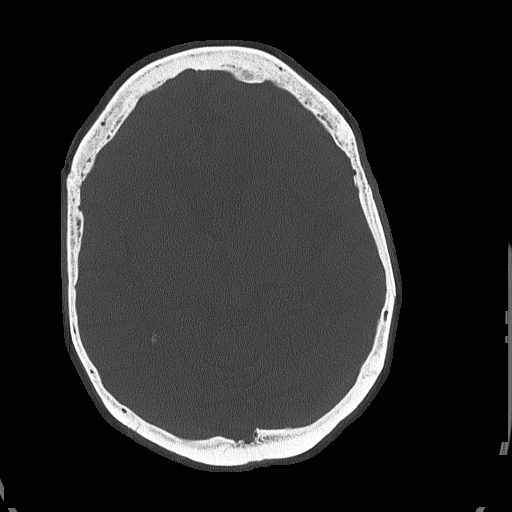
[im 40/72  bone]
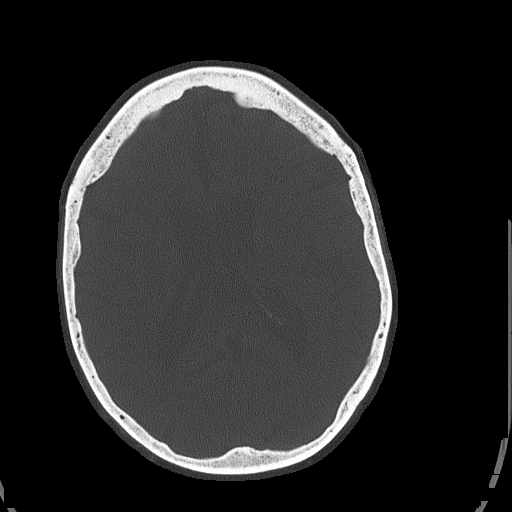
[im 50/72  bone]
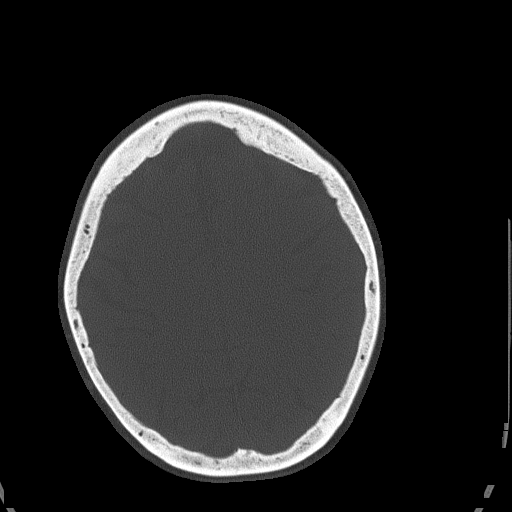
[im 57/72  bone]
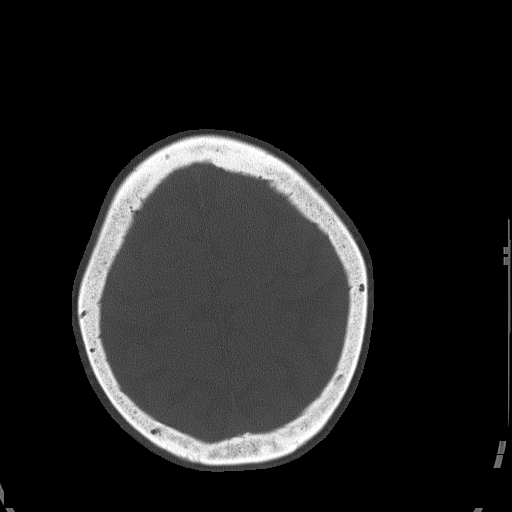
[im 64/72  bone]
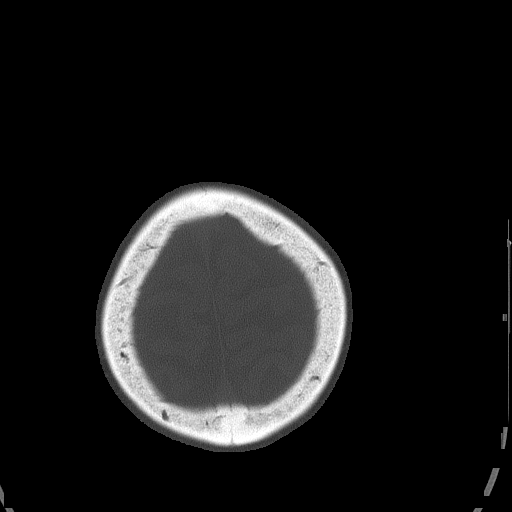

[15 of 30 positions shown; findings below may reference images not displayed]

FINDINGS: Bony calvarium appears intact. Mild diffuse cortical atrophy is
noted. Mild chronic ischemic white matter disease is noted. No mass
effect or midline shift is noted. Ventricular size is within normal
limits. There is no evidence of mass lesion, hemorrhage or acute
infarction.
IMPRESSION: Mild diffuse cortical atrophy. Mild chronic ischemic white matter
disease. No acute intracranial abnormality seen.

## 2018-03-22 ENCOUNTER — Ambulatory Visit: Payer: Medicare Other | Admitting: Family Medicine

## 2018-03-29 ENCOUNTER — Encounter: Payer: Self-pay | Admitting: Family Medicine

## 2018-03-29 ENCOUNTER — Ambulatory Visit (INDEPENDENT_AMBULATORY_CARE_PROVIDER_SITE_OTHER): Payer: Medicare Other | Admitting: Family Medicine

## 2018-03-29 DIAGNOSIS — I482 Chronic atrial fibrillation, unspecified: Secondary | ICD-10-CM

## 2018-03-29 LAB — PT WITH INR/FINGERSTICK
INR, fingerstick: 3.2 ratio — ABNORMAL HIGH
PT FINGERSTICK: 37.9 s — AB (ref 10.5–13.1)

## 2018-03-29 NOTE — Progress Notes (Signed)
Subjective:    Patient ID: Kathy [name], female    DOB: 30-May-1927, 84 y.o.   MRN: [id number]  Medication Refill   Patient is here today to recheck her INR. INR is 3.2.  Currently taking 5 mg of Coumadin on Tuesday, Thursday, Saturday, Sunday.  Taking 2.5 mg of Coumadin on Monday Wednesday and Friday.  She denies any bleeding or bruising. She denies sob, doe, chest pain or palpitations.   Current Outpatient Medications on File Prior to Visit  Medication Sig Dispense Refill  . Calcium Carbonate-Vitamin D (CALTRATE 600+D) 600-400 MG-UNIT per tablet Take 1 tablet by mouth 2 (two) times daily.      . cholecalciferol (VITAMIN D) 400 UNITS TABS Take 400 Units by mouth daily. Take in addition to Caltrate with Vitamin D to get 1200 units of Vitamin D    . digoxin (LANOXIN) 0.125 MG tablet TAKE 1 TABLET BY MOUTH ONCE DAILY NONE  ON  SAT  OR  SUN 30 tablet 11  . hydrochlorothiazide (HYDRODIURIL) 25 MG tablet Take 1 tablet (25 mg total) by mouth daily. 90 tablet 3  . HYDROcodone-acetaminophen (NORCO) 5-325 MG tablet Take 1 tablet by mouth every 6 (six) hours as needed for moderate pain. 60 tablet 0  . lisinopril (PRINIVIL,ZESTRIL) 20 MG tablet Take 1 tablet (20 mg total) by mouth daily. This replaces losartan 90 tablet 3  . magnesium gluconate (MAGONATE) 500 MG tablet Take 500 mg by mouth 2 (two) times daily.    . metoprolol tartrate (LOPRESSOR) 50 MG tablet TAKE 1 TABLET BY MOUTH TWICE DAILY 60 tablet 11  . simvastatin (ZOCOR) 20 MG tablet TAKE ONE TABLET BY MOUTH AT BEDTIME 90 tablet 3  . vitamin B-12 (CYANOCOBALAMIN) 1000 MCG tablet Take 1,000 mcg by mouth daily.    Marland Kitchen warfarin (COUMADIN) 5 MG tablet Take 2 tablets (10 mg total) by mouth daily. (Patient taking differently: Take 10 mg by mouth daily. 07/31/16 2.5 mg M-W-F, 5 MG T-TH-SAT -SU) 60 tablet 2  . warfarin (COUMADIN) 5 MG tablet TAKE 2 TABLETS BY MOUTH ONCE DAILY 60 tablet 11   No current facility-administered medications on file prior  to visit.     Past Medical History:  Diagnosis Date  . Arthritis    both shoulders  . Atrial fibrillation ([place])   . Breast cancer ([place])   . Cancer ([place])    left breast  . Cataracts, bilateral   . Diverticulitis   . Diverticulosis   . Hemorrhoids    internal  . Hiatal hernia   . Hyperlipidemia    takes Zocor daily  . Hypertension    takes Amlodipine daily  . Joint pain    both shoulders  . Macular degeneration 02/2003  . Nasal congestion   . Nocturia   . Obesity   . Osteoporosis   . PONV (postoperative nausea and vomiting)   . Prediabetes   . Shortness of breath    with exertion  . Sinus drainage   . Urinary frequency   . Urinary incontinence    Past Surgical History:  Procedure Laterality Date  . bilateral knee replacements  2008  . BREAST BIOPSY  05/04/2011   Procedure: BREAST BIOPSY WITH NEEDLE LOCALIZATION;  Surgeon: Judieth Keens, DO;  Location: [facility];  Service: General;  Laterality: Left;  . CARDIOVERSION N/A 09/15/2012   Procedure: CARDIOVERSION;  Surgeon: Laverda Page, MD;  Location: [facility];  Service: Cardiovascular;  Laterality: N/A;  h&p in file-HW  .  COLONOSCOPY    . ESOPHAGOGASTRODUODENOSCOPY    . HEMORRHOID SURGERY     outpatient "done in doctor's office"  . JOINT REPLACEMENT  2008   bilateral knee replacement  . left breast biopsy  1984  . right breast biopsy  1993    No Known Allergies Social History   Socioeconomic History  . Marital status: Widowed    Spouse name: Not on file  . Number of children: Not on file  . Years of education: Not on file  . Highest education level: Not on file  Occupational History  . Occupation: retired  Scientific laboratory technician  . Financial resource strain: Not on file  . Food insecurity:    Worry: Not on file    Inability: Not on file  . Transportation needs:    Medical: Not on file    Non-medical: Not on file  Tobacco Use  . Smoking status: Former Smoker    Packs/day: 0.25    Years: 10.00    Pack  years: 2.50    Types: Cigarettes    Last attempt to quit: 05/21/1971    Years since quitting: 46.8  . Smokeless tobacco: Never Used  . Tobacco comment: quit in the 60's  Substance and Sexual Activity  . Alcohol use: No  . Drug use: No  . Sexual activity: Not Currently  Lifestyle  . Physical activity:    Days per week: Not on file    Minutes per session: Not on file  . Stress: Not on file  Relationships  . Social connections:    Talks on phone: Not on file    Gets together: Not on file    Attends religious service: Not on file    Active member of club or organization: Not on file    Attends meetings of clubs or organizations: Not on file    Relationship status: Not on file  . Intimate partner violence:    Fear of current or ex partner: Not on file    Emotionally abused: Not on file    Physically abused: Not on file    Forced sexual activity: Not on file  Other Topics Concern  . Not on file  Social History Narrative  . Not on file      Review of Systems  All other systems reviewed and are negative.      Objective:   Physical Exam  Constitutional: She is oriented to person, place, and time. She appears well-developed. No distress.  Cardiovascular: Normal rate and normal heart sounds. An irregularly irregular rhythm present.  Pulmonary/Chest: Effort normal and breath sounds normal. No respiratory distress. She has no wheezes. She has no rales.  Abdominal: Soft. Bowel sounds are normal.  Neurological: She is alert and oriented to person, place, and time. She has normal reflexes. No cranial nerve deficit. She exhibits normal muscle tone. Coordination normal.  Vitals reviewed.         Assessment & Plan:    Chronic atrial fibrillation - Plan: PT with INR/Fingerstick  INR is supratherapeutic.  Hold Coumadin for 1 day.  Then resume previous dose and recheck INR in 6 weeks.

## 2018-05-04 ENCOUNTER — Other Ambulatory Visit: Payer: Self-pay | Admitting: Family Medicine

## 2018-05-12 ENCOUNTER — Ambulatory Visit (INDEPENDENT_AMBULATORY_CARE_PROVIDER_SITE_OTHER): Payer: Medicare Other | Admitting: Family Medicine

## 2018-05-12 ENCOUNTER — Encounter: Payer: Self-pay | Admitting: Family Medicine

## 2018-05-12 VITALS — BP 110/62 | HR 90 | Temp 98.2°F | Resp 14 | Ht 60.0 in | Wt 159.0 lb

## 2018-05-12 DIAGNOSIS — R29898 Other symptoms and signs involving the musculoskeletal system: Secondary | ICD-10-CM | POA: Diagnosis not present

## 2018-05-12 DIAGNOSIS — I482 Chronic atrial fibrillation, unspecified: Secondary | ICD-10-CM

## 2018-05-12 LAB — PT WITH INR/FINGERSTICK
INR, fingerstick: 2.3 ratio — ABNORMAL HIGH
PT, fingerstick: 27.5 s — ABNORMAL HIGH (ref 10.5–13.1)

## 2018-05-12 NOTE — Progress Notes (Signed)
Subjective:    Patient ID: Kathy [name], female    DOB: 1928/03/30, 85 y.o.   MRN: [id number]  Medication Refill   03/29/18 Patient is here today to recheck her INR. INR is 3.2.  Currently taking 5 mg of Coumadin on Tuesday, Thursday, Saturday, Sunday.  Taking 2.5 mg of Coumadin on Monday Wednesday and Friday.  She denies any bleeding or bruising. She denies sob, doe, chest pain or palpitations.  At that time, my plan was: INR is supratherapeutic.  Hold Coumadin for 1 day.  Then resume previous dose and recheck INR in 6 weeks.   05/12/18 Here to recheck INR.  INR today is therapeutic at 2.3.  However on New Year's, the patient fell.  Her leg simply gave out on her.  She laid in the floor for more than an hour until she was able to slide to a phone to call for help.  Since that time, her family has gotten her fall alarms is able to wear.  She is also using a cane around the home.  Physical exam is performed today and there is no evidence of a stroke or any muscle weakness other than the normal age-related changes that cause her to feel unsteady on her feet.  She does have some neuropathy in the bottoms of her feet that may occur slightly more prone to falls.  She also is interested in having a DNR. Current Outpatient Medications on File Prior to Visit  Medication Sig Dispense Refill  . Calcium Carbonate-Vitamin D (CALTRATE 600+D) 600-400 MG-UNIT per tablet Take 1 tablet by mouth 2 (two) times daily.      . cholecalciferol (VITAMIN D) 400 UNITS TABS Take 400 Units by mouth daily. Take in addition to Caltrate with Vitamin D to get 1200 units of Vitamin D    . digoxin (LANOXIN) 0.125 MG tablet TAKE 1 TABLET BY MOUTH ONCE DAILY NONE  ON  SAT  OR  SUN 30 tablet 11  . hydrochlorothiazide (HYDRODIURIL) 25 MG tablet TAKE 1 TABLET BY MOUTH DAILY 90 tablet 0  . HYDROcodone-acetaminophen (NORCO) 5-325 MG tablet Take 1 tablet by mouth every 6 (six) hours as needed for moderate pain. 60 tablet 0  .  lisinopril (PRINIVIL,ZESTRIL) 20 MG tablet TAKE 1 TABLET BY MOUTH DAILY ( THIS REPLACES LOSARTAN ) 90 tablet 0  . magnesium gluconate (MAGONATE) 500 MG tablet Take 500 mg by mouth 2 (two) times daily.    . metoprolol tartrate (LOPRESSOR) 50 MG tablet TAKE 1 TABLET BY MOUTH TWICE DAILY 60 tablet 11  . simvastatin (ZOCOR) 20 MG tablet TAKE 1 TABLET BY MOUTH AT BEDTIME 90 tablet 0  . vitamin B-12 (CYANOCOBALAMIN) 1000 MCG tablet Take 1,000 mcg by mouth daily.    Marland Kitchen warfarin (COUMADIN) 5 MG tablet Take 2 tablets (10 mg total) by mouth daily. (Patient taking differently: Take 10 mg by mouth daily. 07/31/16 2.5 mg M-W-F, 5 MG T-TH-SAT -SU) 60 tablet 2  . warfarin (COUMADIN) 5 MG tablet TAKE 2 TABLETS BY MOUTH ONCE DAILY 60 tablet 11   No current facility-administered medications on file prior to visit.     Past Medical History:  Diagnosis Date  . Arthritis    both shoulders  . Atrial fibrillation ([place])   . Breast cancer ([place])   . Cancer ([place])    left breast  . Cataracts, bilateral   . Diverticulitis   . Diverticulosis   . Hemorrhoids    internal  . Hiatal  hernia   . Hyperlipidemia    takes Zocor daily  . Hypertension    takes Amlodipine daily  . Joint pain    both shoulders  . Macular degeneration 02/2003  . Nasal congestion   . Nocturia   . Obesity   . Osteoporosis   . PONV (postoperative nausea and vomiting)   . Prediabetes   . Shortness of breath    with exertion  . Sinus drainage   . Urinary frequency   . Urinary incontinence    Past Surgical History:  Procedure Laterality Date  . bilateral knee replacements  2008  . BREAST BIOPSY  05/04/2011   Procedure: BREAST BIOPSY WITH NEEDLE LOCALIZATION;  Surgeon: Judieth Keens, DO;  Location: [facility];  Service: General;  Laterality: Left;  . CARDIOVERSION N/A 09/15/2012   Procedure: CARDIOVERSION;  Surgeon: Laverda Page, MD;  Location: [facility];  Service: Cardiovascular;  Laterality: N/A;  h&p in file-HW  . COLONOSCOPY     . ESOPHAGOGASTRODUODENOSCOPY    . HEMORRHOID SURGERY     outpatient "done in doctor's office"  . JOINT REPLACEMENT  2008   bilateral knee replacement  . left breast biopsy  1984  . right breast biopsy  1993    No Known Allergies Social History   Socioeconomic History  . Marital status: Widowed    Spouse name: Not on file  . Number of children: Not on file  . Years of education: Not on file  . Highest education level: Not on file  Occupational History  . Occupation: retired  Scientific laboratory technician  . Financial resource strain: Not on file  . Food insecurity:    Worry: Not on file    Inability: Not on file  . Transportation needs:    Medical: Not on file    Non-medical: Not on file  Tobacco Use  . Smoking status: Former Smoker    Packs/day: 0.25    Years: 10.00    Pack years: 2.50    Types: Cigarettes    Last attempt to quit: 05/21/1971    Years since quitting: 47.0  . Smokeless tobacco: Never Used  . Tobacco comment: quit in the 60's  Substance and Sexual Activity  . Alcohol use: No  . Drug use: No  . Sexual activity: Not Currently  Lifestyle  . Physical activity:    Days per week: Not on file    Minutes per session: Not on file  . Stress: Not on file  Relationships  . Social connections:    Talks on phone: Not on file    Gets together: Not on file    Attends religious service: Not on file    Active member of club or organization: Not on file    Attends meetings of clubs or organizations: Not on file    Relationship status: Not on file  . Intimate partner violence:    Fear of current or ex partner: Not on file    Emotionally abused: Not on file    Physically abused: Not on file    Forced sexual activity: Not on file  Other Topics Concern  . Not on file  Social History Narrative  . Not on file      Review of Systems  All other systems reviewed and are negative.      Objective:   Physical Exam  Constitutional: She is oriented to person, place, and time.  She appears well-developed. No distress.  Cardiovascular: Normal rate and normal heart sounds. An  irregularly irregular rhythm present.  Pulmonary/Chest: Effort normal and breath sounds normal. No respiratory distress. She has no wheezes. She has no rales.  Abdominal: Soft. Bowel sounds are normal.  Neurological: She is alert and oriented to person, place, and time. She has normal reflexes. No cranial nerve deficit. She exhibits normal muscle tone. Coordination normal.  Vitals reviewed.         Assessment & Plan:     Chronic atrial fibrillation - Plan: PT with INR/Fingerstick, PT with INR/Fingerstick  INR is therapeutic.  Recheck INR in 6 weeks.  I will complete the DNR forms for her chart.  I have recommended home health physical therapy to decrease her risk of falls by improving her muscle strength in her legs

## 2018-05-19 DIAGNOSIS — R29898 Other symptoms and signs involving the musculoskeletal system: Secondary | ICD-10-CM | POA: Diagnosis not present

## 2018-05-19 DIAGNOSIS — I10 Essential (primary) hypertension: Secondary | ICD-10-CM | POA: Diagnosis not present

## 2018-05-19 DIAGNOSIS — M810 Age-related osteoporosis without current pathological fracture: Secondary | ICD-10-CM | POA: Diagnosis not present

## 2018-05-19 DIAGNOSIS — Z7901 Long term (current) use of anticoagulants: Secondary | ICD-10-CM | POA: Diagnosis not present

## 2018-05-19 DIAGNOSIS — Z853 Personal history of malignant neoplasm of breast: Secondary | ICD-10-CM | POA: Diagnosis not present

## 2018-05-19 DIAGNOSIS — Z87891 Personal history of nicotine dependence: Secondary | ICD-10-CM | POA: Diagnosis not present

## 2018-05-19 DIAGNOSIS — Z742 Need for assistance at home and no other household member able to render care: Secondary | ICD-10-CM | POA: Diagnosis not present

## 2018-05-19 DIAGNOSIS — G629 Polyneuropathy, unspecified: Secondary | ICD-10-CM | POA: Diagnosis not present

## 2018-05-19 DIAGNOSIS — Z96653 Presence of artificial knee joint, bilateral: Secondary | ICD-10-CM | POA: Diagnosis not present

## 2018-05-19 DIAGNOSIS — R2681 Unsteadiness on feet: Secondary | ICD-10-CM | POA: Diagnosis not present

## 2018-05-19 DIAGNOSIS — Z9181 History of falling: Secondary | ICD-10-CM | POA: Diagnosis not present

## 2018-05-19 DIAGNOSIS — Z79899 Other long term (current) drug therapy: Secondary | ICD-10-CM | POA: Diagnosis not present

## 2018-05-19 DIAGNOSIS — I482 Chronic atrial fibrillation, unspecified: Secondary | ICD-10-CM | POA: Diagnosis not present

## 2018-05-20 DIAGNOSIS — M810 Age-related osteoporosis without current pathological fracture: Secondary | ICD-10-CM | POA: Diagnosis not present

## 2018-05-20 DIAGNOSIS — G629 Polyneuropathy, unspecified: Secondary | ICD-10-CM | POA: Diagnosis not present

## 2018-05-20 DIAGNOSIS — R2681 Unsteadiness on feet: Secondary | ICD-10-CM | POA: Diagnosis not present

## 2018-05-20 DIAGNOSIS — R29898 Other symptoms and signs involving the musculoskeletal system: Secondary | ICD-10-CM | POA: Diagnosis not present

## 2018-05-20 DIAGNOSIS — Z9181 History of falling: Secondary | ICD-10-CM | POA: Diagnosis not present

## 2018-05-20 DIAGNOSIS — I482 Chronic atrial fibrillation, unspecified: Secondary | ICD-10-CM | POA: Diagnosis not present

## 2018-05-24 DIAGNOSIS — R29898 Other symptoms and signs involving the musculoskeletal system: Secondary | ICD-10-CM | POA: Diagnosis not present

## 2018-05-24 DIAGNOSIS — G629 Polyneuropathy, unspecified: Secondary | ICD-10-CM | POA: Diagnosis not present

## 2018-05-24 DIAGNOSIS — I482 Chronic atrial fibrillation, unspecified: Secondary | ICD-10-CM | POA: Diagnosis not present

## 2018-05-24 DIAGNOSIS — M810 Age-related osteoporosis without current pathological fracture: Secondary | ICD-10-CM | POA: Diagnosis not present

## 2018-05-24 DIAGNOSIS — Z9181 History of falling: Secondary | ICD-10-CM | POA: Diagnosis not present

## 2018-05-24 DIAGNOSIS — R2681 Unsteadiness on feet: Secondary | ICD-10-CM | POA: Diagnosis not present

## 2018-05-27 DIAGNOSIS — R2681 Unsteadiness on feet: Secondary | ICD-10-CM | POA: Diagnosis not present

## 2018-05-27 DIAGNOSIS — G629 Polyneuropathy, unspecified: Secondary | ICD-10-CM | POA: Diagnosis not present

## 2018-05-27 DIAGNOSIS — M810 Age-related osteoporosis without current pathological fracture: Secondary | ICD-10-CM | POA: Diagnosis not present

## 2018-05-27 DIAGNOSIS — I482 Chronic atrial fibrillation, unspecified: Secondary | ICD-10-CM | POA: Diagnosis not present

## 2018-05-27 DIAGNOSIS — Z9181 History of falling: Secondary | ICD-10-CM | POA: Diagnosis not present

## 2018-05-27 DIAGNOSIS — R29898 Other symptoms and signs involving the musculoskeletal system: Secondary | ICD-10-CM | POA: Diagnosis not present

## 2018-05-31 DIAGNOSIS — Z9181 History of falling: Secondary | ICD-10-CM | POA: Diagnosis not present

## 2018-05-31 DIAGNOSIS — R29898 Other symptoms and signs involving the musculoskeletal system: Secondary | ICD-10-CM | POA: Diagnosis not present

## 2018-05-31 DIAGNOSIS — M810 Age-related osteoporosis without current pathological fracture: Secondary | ICD-10-CM | POA: Diagnosis not present

## 2018-05-31 DIAGNOSIS — G629 Polyneuropathy, unspecified: Secondary | ICD-10-CM | POA: Diagnosis not present

## 2018-05-31 DIAGNOSIS — R2681 Unsteadiness on feet: Secondary | ICD-10-CM | POA: Diagnosis not present

## 2018-05-31 DIAGNOSIS — I482 Chronic atrial fibrillation, unspecified: Secondary | ICD-10-CM | POA: Diagnosis not present

## 2018-06-03 DIAGNOSIS — Z9181 History of falling: Secondary | ICD-10-CM | POA: Diagnosis not present

## 2018-06-03 DIAGNOSIS — M810 Age-related osteoporosis without current pathological fracture: Secondary | ICD-10-CM | POA: Diagnosis not present

## 2018-06-03 DIAGNOSIS — R2681 Unsteadiness on feet: Secondary | ICD-10-CM | POA: Diagnosis not present

## 2018-06-03 DIAGNOSIS — R29898 Other symptoms and signs involving the musculoskeletal system: Secondary | ICD-10-CM | POA: Diagnosis not present

## 2018-06-03 DIAGNOSIS — I482 Chronic atrial fibrillation, unspecified: Secondary | ICD-10-CM | POA: Diagnosis not present

## 2018-06-03 DIAGNOSIS — G629 Polyneuropathy, unspecified: Secondary | ICD-10-CM | POA: Diagnosis not present

## 2018-06-07 DIAGNOSIS — R29898 Other symptoms and signs involving the musculoskeletal system: Secondary | ICD-10-CM | POA: Diagnosis not present

## 2018-06-07 DIAGNOSIS — M810 Age-related osteoporosis without current pathological fracture: Secondary | ICD-10-CM | POA: Diagnosis not present

## 2018-06-07 DIAGNOSIS — G629 Polyneuropathy, unspecified: Secondary | ICD-10-CM | POA: Diagnosis not present

## 2018-06-07 DIAGNOSIS — Z9181 History of falling: Secondary | ICD-10-CM | POA: Diagnosis not present

## 2018-06-07 DIAGNOSIS — I482 Chronic atrial fibrillation, unspecified: Secondary | ICD-10-CM | POA: Diagnosis not present

## 2018-06-07 DIAGNOSIS — R2681 Unsteadiness on feet: Secondary | ICD-10-CM | POA: Diagnosis not present

## 2018-06-08 ENCOUNTER — Other Ambulatory Visit: Payer: Self-pay | Admitting: Family Medicine

## 2018-06-09 DIAGNOSIS — Z9181 History of falling: Secondary | ICD-10-CM | POA: Diagnosis not present

## 2018-06-09 DIAGNOSIS — R29898 Other symptoms and signs involving the musculoskeletal system: Secondary | ICD-10-CM | POA: Diagnosis not present

## 2018-06-09 DIAGNOSIS — I482 Chronic atrial fibrillation, unspecified: Secondary | ICD-10-CM | POA: Diagnosis not present

## 2018-06-09 DIAGNOSIS — R2681 Unsteadiness on feet: Secondary | ICD-10-CM | POA: Diagnosis not present

## 2018-06-09 DIAGNOSIS — G629 Polyneuropathy, unspecified: Secondary | ICD-10-CM | POA: Diagnosis not present

## 2018-06-09 DIAGNOSIS — M810 Age-related osteoporosis without current pathological fracture: Secondary | ICD-10-CM | POA: Diagnosis not present

## 2018-06-23 ENCOUNTER — Ambulatory Visit (INDEPENDENT_AMBULATORY_CARE_PROVIDER_SITE_OTHER): Payer: Medicare Other | Admitting: Family Medicine

## 2018-06-23 ENCOUNTER — Encounter: Payer: Self-pay | Admitting: Family Medicine

## 2018-06-23 DIAGNOSIS — I482 Chronic atrial fibrillation, unspecified: Secondary | ICD-10-CM

## 2018-06-23 LAB — PT WITH INR/FINGERSTICK
INR FINGERSTICK: 2.4 ratio — AB
PT, fingerstick: 28.7 s — ABNORMAL HIGH (ref 10.5–13.1)

## 2018-06-23 NOTE — Progress Notes (Signed)
Subjective:    Patient ID: Kathy [name], female    DOB: 04-Jun-1927, 85 y.o.   MRN: [id number]  Medication Refill    Here to recheck INR.  INR today is therapeutic at 2.4.  Patient states that she is doing better since her last visit.  She has received 8 rounds of physical therapy and her legs feel much stronger.  Her strength is better.  She is still using a cane and a walker to ambulate but overall she feels much more steady on her feet.  She is back home living independently. Current Outpatient Medications on File Prior to Visit  Medication Sig Dispense Refill  . Calcium Carbonate-Vitamin D (CALTRATE 600+D) 600-400 MG-UNIT per tablet Take 1 tablet by mouth 2 (two) times daily.      . cholecalciferol (VITAMIN D) 400 UNITS TABS Take 400 Units by mouth daily. Take in addition to Caltrate with Vitamin D to get 1200 units of Vitamin D    . digoxin (LANOXIN) 0.125 MG tablet TAKE 1 TABLET BY MOUTH ONCE DAILY NONE  ON  SAT  OR  SUN 30 tablet 11  . hydrochlorothiazide (HYDRODIURIL) 25 MG tablet TAKE 1 TABLET BY MOUTH DAILY 90 tablet 0  . HYDROcodone-acetaminophen (NORCO) 5-325 MG tablet Take 1 tablet by mouth every 6 (six) hours as needed for moderate pain. 60 tablet 0  . lisinopril (PRINIVIL,ZESTRIL) 20 MG tablet TAKE 1 TABLET BY MOUTH DAILY ( THIS REPLACES LOSARTAN ) 90 tablet 0  . magnesium gluconate (MAGONATE) 500 MG tablet Take 500 mg by mouth 2 (two) times daily.    . metoprolol tartrate (LOPRESSOR) 50 MG tablet TAKE 1 TABLET BY MOUTH TWICE DAILY 60 tablet 5  . simvastatin (ZOCOR) 20 MG tablet TAKE 1 TABLET BY MOUTH AT BEDTIME 90 tablet 0  . vitamin B-12 (CYANOCOBALAMIN) 1000 MCG tablet Take 1,000 mcg by mouth daily.    Marland Kitchen warfarin (COUMADIN) 5 MG tablet Take 2 tablets (10 mg total) by mouth daily. (Patient taking differently: Take 10 mg by mouth daily. 2.5 mg M-W-F, 5 MG T-TH-SAT -SU) 60 tablet 2  . warfarin (COUMADIN) 5 MG tablet TAKE 2 TABLETS BY MOUTH ONCE DAILY 60 tablet 11   No  current facility-administered medications on file prior to visit.     Past Medical History:  Diagnosis Date  . Arthritis    both shoulders  . Atrial fibrillation ([place])   . Breast cancer ([place])   . Cancer ([place])    left breast  . Cataracts, bilateral   . Diverticulitis   . Diverticulosis   . Hemorrhoids    internal  . Hiatal hernia   . Hyperlipidemia    takes Zocor daily  . Hypertension    takes Amlodipine daily  . Joint pain    both shoulders  . Macular degeneration 02/2003  . Nasal congestion   . Nocturia   . Obesity   . Osteoporosis   . PONV (postoperative nausea and vomiting)   . Prediabetes   . Shortness of breath    with exertion  . Sinus drainage   . Urinary frequency   . Urinary incontinence    Past Surgical History:  Procedure Laterality Date  . bilateral knee replacements  2008  . BREAST BIOPSY  05/04/2011   Procedure: BREAST BIOPSY WITH NEEDLE LOCALIZATION;  Surgeon: Judieth Keens, DO;  Location: [facility];  Service: General;  Laterality: Left;  . CARDIOVERSION N/A 09/15/2012   Procedure: CARDIOVERSION;  Surgeon: Cammy Brochure  Carlynn Herald, MD;  [facility];  Service: Cardiovascular;  Laterality: N/A;  h&p in file-HW  . COLONOSCOPY    . ESOPHAGOGASTRODUODENOSCOPY    . HEMORRHOID SURGERY     outpatient "done in doctor's office"  . JOINT REPLACEMENT  2008   bilateral knee replacement  . left breast biopsy  1984  . right breast biopsy  1993    No Known Allergies Social History   Socioeconomic History  . Marital status: Widowed    Spouse name: Not on file  . Number of children: Not on file  . Years of education: Not on file  . Highest education level: Not on file  Occupational History  . Occupation: retired  Scientific laboratory technician  . Financial resource strain: Not on file  . Food insecurity:    Worry: Not on file    Inability: Not on file  . Transportation needs:    Medical: Not on file    Non-medical: Not on file  Tobacco Use  . Smoking status:  Former Smoker    Packs/day: 0.25    Years: 10.00    Pack years: 2.50    Types: Cigarettes    Last attempt to quit: 05/21/1971    Years since quitting: 47.1  . Smokeless tobacco: Never Used  . Tobacco comment: quit in the 60's  Substance and Sexual Activity  . Alcohol use: No  . Drug use: No  . Sexual activity: Not Currently  Lifestyle  . Physical activity:    Days per week: Not on file    Minutes per session: Not on file  . Stress: Not on file  Relationships  . Social connections:    Talks on phone: Not on file    Gets together: Not on file    Attends religious service: Not on file    Active member of club or organization: Not on file    Attends meetings of clubs or organizations: Not on file    Relationship status: Not on file  . Intimate partner violence:    Fear of current or ex partner: Not on file    Emotionally abused: Not on file    Physically abused: Not on file    Forced sexual activity: Not on file  Other Topics Concern  . Not on file  Social History Narrative  . Not on file      Review of Systems  All other systems reviewed and are negative.      Objective:   Physical Exam  Constitutional: She is oriented to person, place, and time. She appears well-developed. No distress.  Cardiovascular: Normal rate and normal heart sounds. An irregularly irregular rhythm present.  Pulmonary/Chest: Effort normal and breath sounds normal. No respiratory distress. She has no wheezes. She has no rales.  Abdominal: Soft. Bowel sounds are normal.  Neurological: She is alert and oriented to person, place, and time. She has normal reflexes. No cranial nerve deficit. She exhibits normal muscle tone. Coordination normal.  Vitals reviewed.         Assessment & Plan:     Chronic atrial fibrillation - Plan: PT with INR/Fingerstick   INR is therapeutic.  Recheck INR in 6 weeks.

## 2018-08-04 ENCOUNTER — Other Ambulatory Visit: Payer: Self-pay

## 2018-08-04 ENCOUNTER — Encounter: Payer: Self-pay | Admitting: Family Medicine

## 2018-08-04 ENCOUNTER — Ambulatory Visit (INDEPENDENT_AMBULATORY_CARE_PROVIDER_SITE_OTHER): Payer: Medicare Other | Admitting: Family Medicine

## 2018-08-04 VITALS — BP 122/68 | HR 72 | Temp 98.3°F | Resp 14 | Ht 60.0 in | Wt 158.0 lb

## 2018-08-04 DIAGNOSIS — I482 Chronic atrial fibrillation, unspecified: Secondary | ICD-10-CM | POA: Diagnosis not present

## 2018-08-04 NOTE — Progress Notes (Signed)
Subjective:    Patient ID: Kathy [name], female    DOB: Nov 02, 1927, 85 y.o.   MRN: [id number]  Medication Refill    Here to recheck INR.  INR today is therapeutic at 2.1.   Current Outpatient Medications on File Prior to Visit  Medication Sig Dispense Refill  . Calcium Carbonate-Vitamin D (CALTRATE 600+D) 600-400 MG-UNIT per tablet Take 1 tablet by mouth 2 (two) times daily.      . cholecalciferol (VITAMIN D) 400 UNITS TABS Take 400 Units by mouth daily. Take in addition to Caltrate with Vitamin D to get 1200 units of Vitamin D    . digoxin (LANOXIN) 0.125 MG tablet TAKE 1 TABLET BY MOUTH ONCE DAILY NONE  ON  SAT  OR  SUN 30 tablet 11  . hydrochlorothiazide (HYDRODIURIL) 25 MG tablet TAKE 1 TABLET BY MOUTH DAILY 90 tablet 0  . HYDROcodone-acetaminophen (NORCO) 5-325 MG tablet Take 1 tablet by mouth every 6 (six) hours as needed for moderate pain. 60 tablet 0  . lisinopril (PRINIVIL,ZESTRIL) 20 MG tablet TAKE 1 TABLET BY MOUTH DAILY ( THIS REPLACES LOSARTAN ) 90 tablet 0  . metoprolol tartrate (LOPRESSOR) 50 MG tablet TAKE 1 TABLET BY MOUTH TWICE DAILY 60 tablet 5  . simvastatin (ZOCOR) 20 MG tablet TAKE 1 TABLET BY MOUTH AT BEDTIME 90 tablet 0  . vitamin B-12 (CYANOCOBALAMIN) 1000 MCG tablet Take 1,000 mcg by mouth daily.    Marland Kitchen warfarin (COUMADIN) 5 MG tablet TAKE 2 TABLETS BY MOUTH ONCE DAILY (Patient taking differently: MWF 2.5 mg and 5 mg all other days) 60 tablet 11   No current facility-administered medications on file prior to visit.     Past Medical History:  Diagnosis Date  . Arthritis    both shoulders  . Atrial fibrillation ([place])   . Breast cancer ([place])   . Cancer ([place])    left breast  . Cataracts, bilateral   . Diverticulitis   . Diverticulosis   . Hemorrhoids    internal  . Hiatal hernia   . Hyperlipidemia    takes Zocor daily  . Hypertension    takes Amlodipine daily  . Joint pain    both shoulders  . Macular degeneration 02/2003  . Nasal  congestion   . Nocturia   . Obesity   . Osteoporosis   . PONV (postoperative nausea and vomiting)   . Prediabetes   . Shortness of breath    with exertion  . Sinus drainage   . Urinary frequency   . Urinary incontinence    Past Surgical History:  Procedure Laterality Date  . bilateral knee replacements  2008  . BREAST BIOPSY  05/04/2011   Procedure: BREAST BIOPSY WITH NEEDLE LOCALIZATION;  Surgeon: Judieth Keens, DO;  Location: [facility];  Service: General;  Laterality: Left;  . CARDIOVERSION N/A 09/15/2012   Procedure: CARDIOVERSION;  Surgeon: Laverda Page, MD;  Location: [facility];  Service: Cardiovascular;  Laterality: N/A;  h&p in file-HW  . COLONOSCOPY    . ESOPHAGOGASTRODUODENOSCOPY    . HEMORRHOID SURGERY     outpatient "done in doctor's office"  . JOINT REPLACEMENT  2008   bilateral knee replacement  . left breast biopsy  1984  . right breast biopsy  1993    No Known Allergies Social History   Socioeconomic History  . Marital status: Widowed    Spouse name: Not on file  . Number of children: Not on file  . Years  of education: Not on file  . Highest education level: Not on file  Occupational History  . Occupation: retired  Scientific laboratory technician  . Financial resource strain: Not on file  . Food insecurity:    Worry: Not on file    Inability: Not on file  . Transportation needs:    Medical: Not on file    Non-medical: Not on file  Tobacco Use  . Smoking status: Former Smoker    Packs/day: 0.25    Years: 10.00    Pack years: 2.50    Types: Cigarettes    Last attempt to quit: 05/21/1971    Years since quitting: 47.2  . Smokeless tobacco: Never Used  . Tobacco comment: quit in the 60's  Substance and Sexual Activity  . Alcohol use: No  . Drug use: No  . Sexual activity: Not Currently  Lifestyle  . Physical activity:    Days per week: Not on file    Minutes per session: Not on file  . Stress: Not on file  Relationships  . Social connections:    Talks  on phone: Not on file    Gets together: Not on file    Attends religious service: Not on file    Active member of club or organization: Not on file    Attends meetings of clubs or organizations: Not on file    Relationship status: Not on file  . Intimate partner violence:    Fear of current or ex partner: Not on file    Emotionally abused: Not on file    Physically abused: Not on file    Forced sexual activity: Not on file  Other Topics Concern  . Not on file  Social History Narrative  . Not on file      Review of Systems  All other systems reviewed and are negative.      Objective:   Physical Exam  Constitutional: She is oriented to person, place, and time. She appears well-developed. No distress.  Cardiovascular: Normal rate and normal heart sounds. An irregularly irregular rhythm present.  Pulmonary/Chest: Effort normal and breath sounds normal. No respiratory distress. She has no wheezes. She has no rales.  Abdominal: Soft. Bowel sounds are normal.  Neurological: She is alert and oriented to person, place, and time. She has normal reflexes. No cranial nerve deficit. She exhibits normal muscle tone. Coordination normal.  Vitals reviewed.         Assessment & Plan:     Atrial Fibrillation  INR is therapeutic.  Recheck INR in 6 weeks.

## 2018-08-08 ENCOUNTER — Other Ambulatory Visit: Payer: Self-pay | Admitting: Family Medicine

## 2018-08-12 ENCOUNTER — Other Ambulatory Visit: Payer: Self-pay | Admitting: *Deleted

## 2018-08-12 MED ORDER — DIGOXIN 125 MCG PO TABS: ORAL_TABLET | ORAL | 0 refills | Status: DC

## 2018-08-12 NOTE — Telephone Encounter (Signed)
Received fax requesting approval to switch Digoxin manufacturer to [employer].   Patient prior brand is on backorder.   Ok to switch?

## 2018-09-23 ENCOUNTER — Ambulatory Visit: Payer: Medicare Other | Admitting: Family Medicine

## 2018-10-03 ENCOUNTER — Other Ambulatory Visit: Payer: Self-pay

## 2018-10-03 ENCOUNTER — Encounter: Payer: Self-pay | Admitting: Family Medicine

## 2018-10-03 ENCOUNTER — Ambulatory Visit (INDEPENDENT_AMBULATORY_CARE_PROVIDER_SITE_OTHER): Payer: Medicare Other | Admitting: Family Medicine

## 2018-10-03 DIAGNOSIS — I482 Chronic atrial fibrillation, unspecified: Secondary | ICD-10-CM | POA: Diagnosis not present

## 2018-10-03 LAB — PT WITH INR/FINGERSTICK
INR, fingerstick: 2.5 ratio — ABNORMAL HIGH
PT, fingerstick: 30.4 s — ABNORMAL HIGH (ref 10.5–13.1)

## 2018-10-03 NOTE — Progress Notes (Signed)
Subjective:    Patient ID: Kathy [name], female    DOB: 1928/03/12, 85 y.o.   MRN: [id number]  Medication Refill    Here to recheck INR.  INR today is therapeutic at 2.6.  Current Outpatient Medications on File Prior to Visit  Medication Sig Dispense Refill  . Calcium Carbonate-Vitamin D (CALTRATE 600+D) 600-400 MG-UNIT per tablet Take 1 tablet by mouth 2 (two) times daily.      . cholecalciferol (VITAMIN D) 400 UNITS TABS Take 400 Units by mouth daily. Take in addition to Caltrate with Vitamin D to get 1200 units of Vitamin D    . digoxin (LANOXIN) 0.125 MG tablet TAKE 1 TABLET BY MOUTH ONCE DAILY ( DO NOT TAKE ON SATURDAY OR SUNDAY ) 30 tablet 0  . hydrochlorothiazide (HYDRODIURIL) 25 MG tablet Take 1 tablet by mouth daily 90 tablet 0  . HYDROcodone-acetaminophen (NORCO) 5-325 MG tablet Take 1 tablet by mouth every 6 (six) hours as needed for moderate pain. 60 tablet 0  . lisinopril (PRINIVIL,ZESTRIL) 20 MG tablet TAKE 1 TABLET BY MOUTH DAILY ( THIS REPLACES LOSARTAN ) 90 tablet 0  . metoprolol tartrate (LOPRESSOR) 50 MG tablet TAKE 1 TABLET BY MOUTH TWICE DAILY 60 tablet 5  . simvastatin (ZOCOR) 20 MG tablet TAKE 1 TABLET BY MOUTH AT BEDTIME 90 tablet 0  . vitamin B-12 (CYANOCOBALAMIN) 1000 MCG tablet Take 1,000 mcg by mouth daily.    Marland Kitchen warfarin (COUMADIN) 5 MG tablet TAKE 2 TABLETS BY MOUTH ONCE DAILY (Patient taking differently: MWF 2.5 mg and 5 mg all other days) 60 tablet 11   No current facility-administered medications on file prior to visit.     Past Medical History:  Diagnosis Date  . Arthritis    both shoulders  . Atrial fibrillation ([place])   . Breast cancer ([place])   . Cancer ([place])    left breast  . Cataracts, bilateral   . Diverticulitis   . Diverticulosis   . Hemorrhoids    internal  . Hiatal hernia   . Hyperlipidemia    takes Zocor daily  . Hypertension    takes Amlodipine daily  . Joint pain    both shoulders  . Macular degeneration 02/2003  .  Nasal congestion   . Nocturia   . Obesity   . Osteoporosis   . PONV (postoperative nausea and vomiting)   . Prediabetes   . Shortness of breath    with exertion  . Sinus drainage   . Urinary frequency   . Urinary incontinence    Past Surgical History:  Procedure Laterality Date  . bilateral knee replacements  2008  . BREAST BIOPSY  05/04/2011   Procedure: BREAST BIOPSY WITH NEEDLE LOCALIZATION;  Surgeon: Judieth Keens, DO;  Location: [facility];  Service: General;  Laterality: Left;  . CARDIOVERSION N/A 09/15/2012   Procedure: CARDIOVERSION;  Surgeon: Laverda Page, MD;  Location: [facility];  Service: Cardiovascular;  Laterality: N/A;  h&p in file-HW  . COLONOSCOPY    . ESOPHAGOGASTRODUODENOSCOPY    . HEMORRHOID SURGERY     outpatient "done in doctor's office"  . JOINT REPLACEMENT  2008   bilateral knee replacement  . left breast biopsy  1984  . right breast biopsy  1993    No Known Allergies Social History   Socioeconomic History  . Marital status: Widowed    Spouse name: Not on file  . Number of children: Not on file  . Years of  education: Not on file  . Highest education level: Not on file  Occupational History  . Occupation: retired  Scientific laboratory technician  . Financial resource strain: Not on file  . Food insecurity:    Worry: Not on file    Inability: Not on file  . Transportation needs:    Medical: Not on file    Non-medical: Not on file  Tobacco Use  . Smoking status: Former Smoker    Packs/day: 0.25    Years: 10.00    Pack years: 2.50    Types: Cigarettes    Last attempt to quit: 05/21/1971    Years since quitting: 47.4  . Smokeless tobacco: Never Used  . Tobacco comment: quit in the 60's  Substance and Sexual Activity  . Alcohol use: No  . Drug use: No  . Sexual activity: Not Currently  Lifestyle  . Physical activity:    Days per week: Not on file    Minutes per session: Not on file  . Stress: Not on file  Relationships  . Social connections:     Talks on phone: Not on file    Gets together: Not on file    Attends religious service: Not on file    Active member of club or organization: Not on file    Attends meetings of clubs or organizations: Not on file    Relationship status: Not on file  . Intimate partner violence:    Fear of current or ex partner: Not on file    Emotionally abused: Not on file    Physically abused: Not on file    Forced sexual activity: Not on file  Other Topics Concern  . Not on file  Social History Narrative  . Not on file      Review of Systems  All other systems reviewed and are negative.      Objective:   Physical Exam  Constitutional: She is oriented to person, place, and time. She appears well-developed. No distress.  Cardiovascular: Normal rate and normal heart sounds. An irregularly irregular rhythm present.  Pulmonary/Chest: Effort normal and breath sounds normal. No respiratory distress. She has no wheezes. She has no rales.  Abdominal: Soft. Bowel sounds are normal.  Neurological: She is alert and oriented to person, place, and time. She has normal reflexes. No cranial nerve deficit. She exhibits normal muscle tone. Coordination normal.  Vitals reviewed.         Assessment & Plan:     Atrial Fibrillation  INR is therapeutic.  Recheck INR in 6 weeks.

## 2018-10-05 ENCOUNTER — Other Ambulatory Visit: Payer: Self-pay | Admitting: Family Medicine

## 2018-10-18 ENCOUNTER — Ambulatory Visit (INDEPENDENT_AMBULATORY_CARE_PROVIDER_SITE_OTHER): Payer: Medicare Other | Admitting: Family Medicine

## 2018-10-18 ENCOUNTER — Other Ambulatory Visit: Payer: Self-pay

## 2018-10-18 ENCOUNTER — Encounter: Payer: Self-pay | Admitting: Family Medicine

## 2018-10-18 VITALS — BP 120/60 | HR 88 | Temp 98.9°F | Resp 16 | Ht 60.0 in | Wt 156.0 lb

## 2018-10-18 DIAGNOSIS — M25511 Pain in right shoulder: Secondary | ICD-10-CM

## 2018-10-18 MED ORDER — OXYCODONE-ACETAMINOPHEN 5-325 MG PO TABS: 1.0000 | ORAL_TABLET | ORAL | 0 refills | Status: DC | PRN

## 2018-10-18 NOTE — Progress Notes (Signed)
Subjective:    Patient ID: Kathy [name], female    DOB: [birth year], 85 y.o.   MRN: [id number]  HPI Patient presents with moderate to severe pain in her right shoulder.  She is holding her arm down against her side.  If she tries to abduct the shoulder even is much is 30 degrees she complains of severe pain.  She winces when I grab her arm and try to gently lift her arm forward or to the side.  She will not allow me to passively abduct the shoulder greater than 45 degrees due to pain.  She involuntarily guards.  She denies any falls or injury.  Patient screams softly when I attempt to do the drop test even from 45 degrees.  It is almost like she has a frozen shoulder.  She states that she has been having pain in her right shoulder for quite some time.  We have been treating it symptomatically with hydrocodone however recently, without any specific inciting event, she has developed severe pain in the shoulder.  Her behavior today is akin to a complete rotator cuff tear versus frozen shoulder/adhesive capsulitis. Past Medical History:  Diagnosis Date  . Arthritis    both shoulders  . Atrial fibrillation ([place])   . Breast cancer ([place])   . Cancer ([place])    left breast  . Cataracts, bilateral   . Diverticulitis   . Diverticulosis   . Hemorrhoids    internal  . Hiatal hernia   . Hyperlipidemia    takes Zocor daily  . Hypertension    takes Amlodipine daily  . Joint pain    both shoulders  . Macular degeneration 02/2003  . Nasal congestion   . Nocturia   . Obesity   . Osteoporosis   . PONV (postoperative nausea and vomiting)   . Prediabetes   . Shortness of breath    with exertion  . Sinus drainage   . Urinary frequency   . Urinary incontinence    Past Surgical History:  Procedure Laterality Date  . bilateral knee replacements  2008  . BREAST BIOPSY  05/04/2011   Procedure: BREAST BIOPSY WITH NEEDLE LOCALIZATION;  Surgeon: Judieth Keens, DO;  Location: [facility];  Service: General;   Laterality: Left;  . CARDIOVERSION N/A 09/15/2012   Procedure: CARDIOVERSION;  Surgeon: Laverda Page, MD;  Location: [facility];  Service: Cardiovascular;  Laterality: N/A;  h&p in file-HW  . COLONOSCOPY    . ESOPHAGOGASTRODUODENOSCOPY    . HEMORRHOID SURGERY     outpatient "done in doctor's office"  . JOINT REPLACEMENT  2008   bilateral knee replacement  . left breast biopsy  1984  . right breast biopsy  1993   Current Outpatient Medications on File Prior to Visit  Medication Sig Dispense Refill  . Calcium Carbonate-Vitamin D (CALTRATE 600+D) 600-400 MG-UNIT per tablet Take 1 tablet by mouth 2 (two) times daily.      . cholecalciferol (VITAMIN D) 400 UNITS TABS Take 400 Units by mouth daily. Take in addition to Caltrate with Vitamin D to get 1200 units of Vitamin D    . digoxin (LANOXIN) 0.125 MG tablet TAKE 1 TABLET BY MOUTH ONCE DAILY ( DO NOT TAKE ON SATURDAY OR SUNDAY ) 30 tablet 0  . hydrochlorothiazide (HYDRODIURIL) 25 MG tablet Take 1 tablet by mouth daily 90 tablet 0  . HYDROcodone-acetaminophen (NORCO) 5-325 MG tablet Take 1 tablet by mouth every 6 (six) hours as needed for moderate pain. 60 tablet  0  . lisinopril (ZESTRIL) 20 MG tablet TAKE 1 TABLET BY MOUTH  DAILY ( THIS REPLACES LOSARTAN ) 90 tablet 0  . metoprolol tartrate (LOPRESSOR) 50 MG tablet TAKE 1 TABLET BY MOUTH TWICE DAILY 60 tablet 5  . simvastatin (ZOCOR) 20 MG tablet TAKE 1 TABLET BY MOUTH AT BEDTIME 90 tablet 0  . vitamin B-12 (CYANOCOBALAMIN) 1000 MCG tablet Take 1,000 mcg by mouth daily.    Marland Kitchen warfarin (COUMADIN) 5 MG tablet TAKE 2 TABLETS BY MOUTH ONCE DAILY (Patient taking differently: MWF 2.5 mg and 5 mg all other days) 60 tablet 11   No current facility-administered medications on file prior to visit.    No Known Allergies Social History   Socioeconomic History  . Marital status: Widowed    Spouse name: Not on file  . Number of children: Not on file  . Years of education: Not on file  .  Highest education level: Not on file  Occupational History  . Occupation: retired  Scientific laboratory technician  . Financial resource strain: Not on file  . Food insecurity    Worry: Not on file    Inability: Not on file  . Transportation needs    Medical: Not on file    Non-medical: Not on file  Tobacco Use  . Smoking status: Former Smoker    Packs/day: 0.25    Years: 10.00    Pack years: 2.50    Types: Cigarettes    Quit date: 05/21/1971    Years since quitting: 47.4  . Smokeless tobacco: Never Used  . Tobacco comment: quit in the 60's  Substance and Sexual Activity  . Alcohol use: No  . Drug use: No  . Sexual activity: Not Currently  Lifestyle  . Physical activity    Days per week: Not on file    Minutes per session: Not on file  . Stress: Not on file  Relationships  . Social Herbalist on phone: Not on file    Gets together: Not on file    Attends religious service: Not on file    Active member of club or organization: Not on file    Attends meetings of clubs or organizations: Not on file    Relationship status: Not on file  . Intimate partner violence    Fear of current or ex partner: Not on file    Emotionally abused: Not on file    Physically abused: Not on file    Forced sexual activity: Not on file  Other Topics Concern  . Not on file  Social History Narrative  . Not on file      Review of Systems  All other systems reviewed and are negative.      Objective:   Physical Exam Vitals signs reviewed.  Cardiovascular:     Rate and Rhythm: Normal rate. Rhythm irregular.  Pulmonary:     Effort: Pulmonary effort is normal.     Breath sounds: Normal breath sounds.  Musculoskeletal:     Right shoulder: She exhibits decreased range of motion, tenderness, bony tenderness, crepitus, pain and decreased strength. She exhibits no deformity.           Assessment & Plan:  1. Acute pain of right shoulder Patient has severe pain in her right shoulder.  I  suspect a rotator cuff tear versus adhesive capsulitis.  I will send the patient for an x-ray of her right shoulder immediately.  Using sterile technique, I injected the right shoulder  with 2 cc lidocaine, 2 cc of Marcaine, and 2 cc of 40 mg/mL Kenalog.  The patient tolerated the procedure well without complication.  Of asked her to discontinue hydrocodone and replace with oxycodone/acetaminophen 5/325 1 p.o. every 6 hours as needed pain.  I cautioned the patient about sedation, delirium, and constipation on this medication and recommended she take it with a stool softener.  If pain is worsening and x-ray is unremarkable, I will consult orthopedics. - DG Shoulder Right; Future - oxyCODONE-acetaminophen (PERCOCET) 5-325 MG tablet; Take 1 tablet by mouth every 4 (four) hours as needed for severe pain (stop hydrocodone).  Dispense: 30 tablet; Refill: 0

## 2018-10-19 ENCOUNTER — Ambulatory Visit
Admission: RE | Admit: 2018-10-19 | Discharge: 2018-10-19 | Disposition: A | Discharge status: Home / Self Care | Payer: Medicare Other | Source: Ambulatory Visit | Attending: Family Medicine | Admitting: Family Medicine

## 2018-10-19 DIAGNOSIS — M25511 Pain in right shoulder: Secondary | ICD-10-CM | POA: Diagnosis not present

## 2018-10-19 DIAGNOSIS — S4991XA Unspecified injury of right shoulder and upper arm, initial encounter: Secondary | ICD-10-CM | POA: Diagnosis not present

## 2018-10-20 ENCOUNTER — Other Ambulatory Visit: Payer: Self-pay | Admitting: Family Medicine

## 2018-10-20 DIAGNOSIS — M25511 Pain in right shoulder: Secondary | ICD-10-CM

## 2018-10-20 DIAGNOSIS — M19011 Primary osteoarthritis, right shoulder: Secondary | ICD-10-CM

## 2018-11-01 DIAGNOSIS — M25511 Pain in right shoulder: Secondary | ICD-10-CM | POA: Diagnosis not present

## 2018-11-02 ENCOUNTER — Other Ambulatory Visit: Payer: Self-pay | Admitting: Family Medicine

## 2018-11-11 ENCOUNTER — Other Ambulatory Visit: Payer: Self-pay

## 2018-11-14 ENCOUNTER — Encounter: Payer: Self-pay | Admitting: Family Medicine

## 2018-11-14 ENCOUNTER — Other Ambulatory Visit: Payer: Self-pay

## 2018-11-14 ENCOUNTER — Other Ambulatory Visit: Payer: Self-pay | Admitting: Family Medicine

## 2018-11-14 ENCOUNTER — Ambulatory Visit (INDEPENDENT_AMBULATORY_CARE_PROVIDER_SITE_OTHER): Payer: Medicare Other | Admitting: Family Medicine

## 2018-11-14 DIAGNOSIS — I482 Chronic atrial fibrillation, unspecified: Secondary | ICD-10-CM | POA: Diagnosis not present

## 2018-11-14 MED ORDER — PREDNISONE 20 MG PO TABS: ORAL_TABLET | ORAL | 0 refills | Status: DC

## 2018-11-14 NOTE — Progress Notes (Signed)
Subjective:    Patient ID: Kathy [name], female    DOB: 07/25/1927, 85 y.o.   MRN: [id number]  Here to recheck INR.  INR today is therapeutic at 2.1.. She does complain of some low back pain at the level of the sacrum.  It radiates into her gluteal area.  Hurts with standing and walking.  She denies any falls or injuries.  There is no tenderness to palpation over the spinous processes over the coccyx itself.  She denies any saddle anesthesia or bowel or bladder incontinence. Current Outpatient Medications on File Prior to Visit  Medication Sig Dispense Refill  . Calcium Carbonate-Vitamin D (CALTRATE 600+D) 600-400 MG-UNIT per tablet Take 1 tablet by mouth 2 (two) times daily.      . cholecalciferol (VITAMIN D) 400 UNITS TABS Take 400 Units by mouth daily. Take in addition to Caltrate with Vitamin D to get 1200 units of Vitamin D    . digoxin (LANOXIN) 0.125 MG tablet TAKE 1 TABLET BY MOUTH ONCE DAILY ( DO NOT TAKE ON SATURDAY OR SUNDAY) 30 tablet 0  . HYDROcodone-acetaminophen (NORCO) 5-325 MG tablet Take 1 tablet by mouth every 6 (six) hours as needed for moderate pain. 60 tablet 0  . lisinopril (ZESTRIL) 20 MG tablet TAKE 1 TABLET BY MOUTH  DAILY ( THIS REPLACES LOSARTAN ) 90 tablet 0  . metoprolol tartrate (LOPRESSOR) 50 MG tablet TAKE 1 TABLET BY MOUTH TWICE DAILY 60 tablet 5  . oxyCODONE-acetaminophen (PERCOCET) 5-325 MG tablet Take 1 tablet by mouth every 4 (four) hours as needed for severe pain (stop hydrocodone). 30 tablet 0  . vitamin B-12 (CYANOCOBALAMIN) 1000 MCG tablet Take 1,000 mcg by mouth daily.    Marland Kitchen warfarin (COUMADIN) 5 MG tablet TAKE 2 TABLETS BY MOUTH ONCE DAILY (Patient taking differently: MWF 2.5 mg and 5 mg all other days) 60 tablet 11   No current facility-administered medications on file prior to visit.     Past Medical History:  Diagnosis Date  . Arthritis    both shoulders  . Atrial fibrillation ([place])   . Breast cancer ([place])   . Cancer ([place])    left  breast  . Cataracts, bilateral   . Diverticulitis   . Diverticulosis   . Hemorrhoids    internal  . Hiatal hernia   . Hyperlipidemia    takes Zocor daily  . Hypertension    takes Amlodipine daily  . Joint pain    both shoulders  . Macular degeneration 02/2003  . Nasal congestion   . Nocturia   . Obesity   . Osteoporosis   . PONV (postoperative nausea and vomiting)   . Prediabetes   . Shortness of breath    with exertion  . Sinus drainage   . Urinary frequency   . Urinary incontinence    Past Surgical History:  Procedure Laterality Date  . bilateral knee replacements  2008  . BREAST BIOPSY  05/04/2011   Procedure: BREAST BIOPSY WITH NEEDLE LOCALIZATION;  Surgeon: Judieth Keens, DO;  Location: [facility];  Service: General;  Laterality: Left;  . CARDIOVERSION N/A 09/15/2012   Procedure: CARDIOVERSION;  Surgeon: Laverda Page, MD;  Location: [facility];  Service: Cardiovascular;  Laterality: N/A;  h&p in file-HW  . COLONOSCOPY    . ESOPHAGOGASTRODUODENOSCOPY    . HEMORRHOID SURGERY     outpatient "done in doctor's office"  . JOINT REPLACEMENT  2008   bilateral knee replacement  . left breast  biopsy  1984  . right breast biopsy  1993    No Known Allergies Social History   Socioeconomic History  . Marital status: Widowed    Spouse name: Not on file  . Number of children: Not on file  . Years of education: Not on file  . Highest education level: Not on file  Occupational History  . Occupation: retired  Scientific laboratory technician  . Financial resource strain: Not on file  . Food insecurity    Worry: Not on file    Inability: Not on file  . Transportation needs    Medical: Not on file    Non-medical: Not on file  Tobacco Use  . Smoking status: Former Smoker    Packs/day: 0.25    Years: 10.00    Pack years: 2.50    Types: Cigarettes    Quit date: 05/21/1971    Years since quitting: 47.5  . Smokeless tobacco: Never Used  . Tobacco comment: quit in the 60's  Substance  and Sexual Activity  . Alcohol use: No  . Drug use: No  . Sexual activity: Not Currently  Lifestyle  . Physical activity    Days per week: Not on file    Minutes per session: Not on file  . Stress: Not on file  Relationships  . Social Herbalist on phone: Not on file    Gets together: Not on file    Attends religious service: Not on file    Active member of club or organization: Not on file    Attends meetings of clubs or organizations: Not on file    Relationship status: Not on file  . Intimate partner violence    Fear of current or ex partner: Not on file    Emotionally abused: Not on file    Physically abused: Not on file    Forced sexual activity: Not on file  Other Topics Concern  . Not on file  Social History Narrative  . Not on file      Review of Systems  All other systems reviewed and are negative.      Objective:   Physical Exam Vitals signs reviewed.  Constitutional:      General: She is not in acute distress.    Appearance: She is well-developed.  Cardiovascular:     Rate and Rhythm: Normal rate. Rhythm irregularly irregular.     Heart sounds: Normal heart sounds.  Pulmonary:     Effort: Pulmonary effort is normal. No respiratory distress.     Breath sounds: Normal breath sounds. No wheezing or rales.  Abdominal:     General: Bowel sounds are normal.     Palpations: Abdomen is soft.  Musculoskeletal:     Lumbar back: She exhibits tenderness and pain. She exhibits no bony tenderness, no swelling, no edema, no deformity, no laceration and no spasm.       Back:  Neurological:     Mental Status: She is alert and oriented to person, place, and time.     Cranial Nerves: No cranial nerve deficit.     Motor: No abnormal muscle tone.     Coordination: Coordination normal.     Deep Tendon Reflexes: Reflexes are normal and symmetric.           Assessment & Plan:     Atrial Fibrillation  INR is therapeutic.  Recheck INR in 6 weeks.   I  suspect the low back pain is likely due to degenerative  disc disease in the lumbar spine.  Try prednisone taper pack given the fact patient is on Coumadin.  Recommend x-rays if pain persist.

## 2018-11-15 LAB — PT WITH INR/FINGERSTICK
INR, fingerstick: 2.1 ratio — ABNORMAL HIGH
PT, fingerstick: 25.5 s — ABNORMAL HIGH (ref 10.5–13.1)

## 2018-12-05 ENCOUNTER — Other Ambulatory Visit: Payer: Self-pay | Admitting: Family Medicine

## 2018-12-19 ENCOUNTER — Other Ambulatory Visit: Payer: Self-pay | Admitting: Family Medicine

## 2018-12-26 ENCOUNTER — Other Ambulatory Visit: Payer: Self-pay

## 2018-12-26 ENCOUNTER — Encounter: Payer: Self-pay | Admitting: Family Medicine

## 2018-12-26 ENCOUNTER — Ambulatory Visit (INDEPENDENT_AMBULATORY_CARE_PROVIDER_SITE_OTHER): Payer: Medicare Other | Admitting: Family Medicine

## 2018-12-26 VITALS — BP 100/58 | HR 86 | Temp 98.7°F | Resp 16 | Ht 60.0 in | Wt 151.0 lb

## 2018-12-26 DIAGNOSIS — I482 Chronic atrial fibrillation, unspecified: Secondary | ICD-10-CM

## 2018-12-26 DIAGNOSIS — M545 Low back pain, unspecified: Secondary | ICD-10-CM

## 2018-12-26 LAB — PT WITH INR/FINGERSTICK
INR, fingerstick: 2.7 ratio — ABNORMAL HIGH
PT, fingerstick: 32.1 s — ABNORMAL HIGH (ref 10.5–13.1)

## 2018-12-26 MED ORDER — TIZANIDINE HCL 4 MG PO TABS: 4.0000 mg | ORAL_TABLET | Freq: Three times a day (TID) | ORAL | 0 refills | Status: DC | PRN

## 2018-12-26 NOTE — Progress Notes (Signed)
Subjective:    Patient ID: Kathy [name], female    DOB: Sep 03, 1927, 85 y.o.   MRN: [id number]  Here to recheck INR.  INR today is therapeutic at 2.7.  Continues to have low back pain at the level of the sacrum.  It radiates into her left gluteal area.  Hurts with standing and walking.  She denies any falls or injuries.  There is no tenderness to palpation over the spinous processes or over the coccyx itself.  She denies any saddle anesthesia or bowel or bladder incontinence. Mild ttp over the superior left gluteus.  Current Outpatient Medications on File Prior to Visit  Medication Sig Dispense Refill  . Calcium Carbonate-Vitamin D (CALTRATE 600+D) 600-400 MG-UNIT per tablet Take 1 tablet by mouth 2 (two) times daily.      . cholecalciferol (VITAMIN D) 400 UNITS TABS Take 400 Units by mouth daily. Take in addition to Caltrate with Vitamin D to get 1200 units of Vitamin D    . digoxin (LANOXIN) 0.125 MG tablet TAKE 1 TABLET BY MOUTH ONCE DAILY ( DO NOT TAKE ON SATURDAY ON SUNDAY ) 90 tablet 3  . hydrochlorothiazide (HYDRODIURIL) 25 MG tablet Take 1 tablet by mouth daily 90 tablet 3  . HYDROcodone-acetaminophen (NORCO) 5-325 MG tablet Take 1 tablet by mouth every 6 (six) hours as needed for moderate pain. 60 tablet 0  . lisinopril (ZESTRIL) 20 MG tablet TAKE 1 TABLET BY MOUTH  DAILY ( THIS REPLACES LOSARTAN ) 90 tablet 0  . metoprolol tartrate (LOPRESSOR) 50 MG tablet Take 1 tablet by mouth twice daily 60 tablet 2  . oxyCODONE-acetaminophen (PERCOCET) 5-325 MG tablet Take 1 tablet by mouth every 4 (four) hours as needed for severe pain (stop hydrocodone). 30 tablet 0  . simvastatin (ZOCOR) 20 MG tablet TAKE 1 TABLET BY MOUTH AT BEDTIME 90 tablet 2  . vitamin B-12 (CYANOCOBALAMIN) 1000 MCG tablet Take 1,000 mcg by mouth daily.    Marland Kitchen warfarin (COUMADIN) 5 MG tablet Take 2 tablets by mouth once daily 60 tablet 5   No current facility-administered medications on file prior to visit.      Past Medical History:  Diagnosis Date  . Arthritis    both shoulders  . Atrial fibrillation ([place])   . Breast cancer ([facility])   . Cancer ([facility])    left breast  . Cataracts, bilateral   . Diverticulitis   . Diverticulosis   . Hemorrhoids    internal  . Hiatal hernia   . Hyperlipidemia    takes Zocor daily  . Hypertension    takes Amlodipine daily  . Joint pain    both shoulders  . Macular degeneration 02/2003  . Nasal congestion   . Nocturia   . Obesity   . Osteoporosis   . PONV (postoperative nausea and vomiting)   . Prediabetes   . Shortness of breath    with exertion  . Sinus drainage   . Urinary frequency   . Urinary incontinence    Past Surgical History:  Procedure Laterality Date  . bilateral knee replacements  2008  . BREAST BIOPSY  05/04/2011   Procedure: BREAST BIOPSY WITH NEEDLE LOCALIZATION;  Surgeon: Judieth Keens, DO;  Location: [facility];  Service: General;  Laterality: Left;  . CARDIOVERSION N/A 09/15/2012   Procedure: CARDIOVERSION;  Surgeon: Laverda Page, MD;  Location: [facility];  Service: Cardiovascular;  Laterality: N/A;  h&p in file-HW  . COLONOSCOPY    .  ESOPHAGOGASTRODUODENOSCOPY    . HEMORRHOID SURGERY     outpatient "done in doctor's office"  . JOINT REPLACEMENT  2008   bilateral knee replacement  . left breast biopsy  1984  . right breast biopsy  1993    No Known Allergies Social History   Socioeconomic History  . Marital status: Widowed    Spouse name: Not on file  . Number of children: Not on file  . Years of education: Not on file  . Highest education level: Not on file  Occupational History  . Occupation: retired  Scientific laboratory technician  . Financial resource strain: Not on file  . Food insecurity    Worry: Not on file    Inability: Not on file  . Transportation needs    Medical: Not on file    Non-medical: Not on file  Tobacco Use  . Smoking status: Former Smoker    Packs/day: 0.25    Years: 10.00    Pack years: 2.50     Types: Cigarettes    Quit date: 05/21/1971    Years since quitting: 47.6  . Smokeless tobacco: Never Used  . Tobacco comment: quit in the 60's  Substance and Sexual Activity  . Alcohol use: No  . Drug use: No  . Sexual activity: Not Currently  Lifestyle  . Physical activity    Days per week: Not on file    Minutes per session: Not on file  . Stress: Not on file  Relationships  . Social Herbalist on phone: Not on file    Gets together: Not on file    Attends religious service: Not on file    Active member of club or organization: Not on file    Attends meetings of clubs or organizations: Not on file    Relationship status: Not on file  . Intimate partner violence    Fear of current or ex partner: Not on file    Emotionally abused: Not on file    Physically abused: Not on file    Forced sexual activity: Not on file  Other Topics Concern  . Not on file  Social History Narrative  . Not on file      Review of Systems  All other systems reviewed and are negative.      Objective:   Physical Exam Vitals signs reviewed.  Constitutional:      General: She is not in acute distress.    Appearance: She is well-developed.  Cardiovascular:     Rate and Rhythm: Normal rate. Rhythm irregularly irregular.     Heart sounds: Normal heart sounds.  Pulmonary:     Effort: Pulmonary effort is normal. No respiratory distress.     Breath sounds: Normal breath sounds. No wheezing or rales.  Abdominal:     General: Bowel sounds are normal.     Palpations: Abdomen is soft.  Musculoskeletal:     Lumbar back: She exhibits decreased range of motion and tenderness. She exhibits no bony tenderness, no pain and no spasm.       Back:  Neurological:     Mental Status: She is alert and oriented to person, place, and time.     Cranial Nerves: No cranial nerve deficit.     Motor: No abnormal muscle tone.     Coordination: Coordination normal.     Deep Tendon Reflexes: Reflexes are  normal and symmetric.  Assessment & Plan:      Left low back pain, unspecified chronicity, unspecified whether sciatica present - Plan: DG Lumbar Spine Complete  Chronic atrial fibrillation - Plan: PT with INR/Fingerstick   INR is therapeutic.  Recheck INR in 6 weeks.   I suspect the low back pain is likely due to degenerative disc disease in the lumbar spine and muscle spasms. Try cautiously zanalex 2-4 mg poq 8 hrs prn muscle pain given the fact patient is on Coumadin.  Obtaine l-spine xray.

## 2019-01-03 ENCOUNTER — Telehealth: Payer: Self-pay | Admitting: Family Medicine

## 2019-01-03 ENCOUNTER — Other Ambulatory Visit: Payer: Self-pay | Admitting: Family Medicine

## 2019-01-03 NOTE — Telephone Encounter (Signed)
Spoke with patient and informed her per Dr. Dennard Schaumann to stop taking Zanaflex. Patient verbalized understanding.

## 2019-01-03 NOTE — Telephone Encounter (Signed)
Patient called in stating that she can not tolerate the Zanaflex that was prescribed. She states that it is causing her to get weakness in her legs and causing her to feel weird. States she wanted to ensure it is ok to discontinue. Advised that it should be but I would notify you.

## 2019-01-03 NOTE — Telephone Encounter (Signed)
Absolutely.  Do not take it if it makes her feel that way.  I am sorry.

## 2019-01-17 ENCOUNTER — Ambulatory Visit
Admission: RE | Admit: 2019-01-17 | Discharge: 2019-01-17 | Disposition: A | Discharge status: Home / Self Care | Payer: Medicare Other | Source: Ambulatory Visit | Attending: Family Medicine | Admitting: Family Medicine

## 2019-01-17 DIAGNOSIS — M545 Low back pain, unspecified: Secondary | ICD-10-CM

## 2019-02-07 ENCOUNTER — Other Ambulatory Visit: Payer: Self-pay

## 2019-02-07 ENCOUNTER — Ambulatory Visit (INDEPENDENT_AMBULATORY_CARE_PROVIDER_SITE_OTHER): Payer: Medicare Other | Admitting: Family Medicine

## 2019-02-07 VITALS — BP 112/64 | HR 82 | Temp 98.4°F | Wt 151.0 lb

## 2019-02-07 DIAGNOSIS — I482 Chronic atrial fibrillation, unspecified: Secondary | ICD-10-CM | POA: Diagnosis not present

## 2019-02-07 DIAGNOSIS — Z23 Encounter for immunization: Secondary | ICD-10-CM

## 2019-02-07 DIAGNOSIS — M19019 Primary osteoarthritis, unspecified shoulder: Secondary | ICD-10-CM | POA: Diagnosis not present

## 2019-02-07 DIAGNOSIS — M5136 Other intervertebral disc degeneration, lumbar region: Secondary | ICD-10-CM | POA: Diagnosis not present

## 2019-02-07 DIAGNOSIS — M51369 Other intervertebral disc degeneration, lumbar region without mention of lumbar back pain or lower extremity pain: Secondary | ICD-10-CM

## 2019-02-07 LAB — PT WITH INR/FINGERSTICK
INR, fingerstick: 2.8 ratio — ABNORMAL HIGH
PT, fingerstick: 33.8 s — ABNORMAL HIGH (ref 10.5–13.1)

## 2019-02-07 MED ORDER — HYDROCODONE-ACETAMINOPHEN 5-325 MG PO TABS: 1.0000 | ORAL_TABLET | Freq: Four times a day (QID) | ORAL | 0 refills | Status: DC | PRN

## 2019-02-07 NOTE — Progress Notes (Signed)
Subjective:    Patient ID: Kathy [name], female    DOB: 1927-10-19, 85 y.o.   MRN: [id number] 12/26/18 Here to recheck INR.  INR today is therapeutic at 2.7.  Continues to have low back pain at the level of the sacrum.  It radiates into her left gluteal area.  Hurts with standing and walking.  She denies any falls or injuries.  There is no tenderness to palpation over the spinous processes or over the coccyx itself.  She denies any saddle anesthesia or bowel or bladder incontinence. Mild ttp over the superior left gluteus.   At that time, my plan was: INR is therapeutic.  Recheck INR in 6 weeks.   I suspect the low back pain is likely due to degenerative disc disease in the lumbar spine and muscle spasms. Try cautiously zanalex 2-4 mg poq 8 hrs prn muscle pain given the fact patient is on Coumadin.  Obtaine l-spine xray.  02/07/19 X-ray shows severe degenerative disc disease all throughout the lumbar spine.  Please see the x-ray report from September.  At the present time Tylenol is helping the patient's pain.  She occasionally takes hydrocodone.  She continues to deny any lumbar radiculopathy.  She denies any weakness in her legs or bowel or bladder incontinence.  Patient is here today to recheck her INR. Current Outpatient Medications on File Prior to Visit  Medication Sig Dispense Refill  . Calcium Carbonate-Vitamin D (CALTRATE 600+D) 600-400 MG-UNIT per tablet Take 1 tablet by mouth 2 (two) times daily.      . cholecalciferol (VITAMIN D) 400 UNITS TABS Take 400 Units by mouth daily. Take in addition to Caltrate with Vitamin D to get 1200 units of Vitamin D    . digoxin (LANOXIN) 0.125 MG tablet TAKE 1 TABLET BY MOUTH ONCE DAILY ( DO NOT TAKE ON SATURDAY ON SUNDAY ) 90 tablet 3  . hydrochlorothiazide (HYDRODIURIL) 25 MG tablet Take 1 tablet by mouth daily 90 tablet 3  . HYDROcodone-acetaminophen (NORCO) 5-325 MG tablet Take 1 tablet by mouth every 6 (six) hours as needed for moderate  pain. 60 tablet 0  . lisinopril (ZESTRIL) 20 MG tablet TAKE 1 TABLET BY MOUTH DAILY ( THIS REPLACES LOSARTAN) 90 tablet 0  . metoprolol tartrate (LOPRESSOR) 50 MG tablet Take 1 tablet by mouth twice daily 60 tablet 2  . oxyCODONE-acetaminophen (PERCOCET) 5-325 MG tablet Take 1 tablet by mouth every 4 (four) hours as needed for severe pain (stop hydrocodone). 30 tablet 0  . simvastatin (ZOCOR) 20 MG tablet TAKE 1 TABLET BY MOUTH AT BEDTIME 90 tablet 2  . tiZANidine (ZANAFLEX) 4 MG tablet Take 1 tablet (4 mg total) by mouth every 8 (eight) hours as needed for muscle spasms ((try taking 1/2 pill at first)). 30 tablet 0  . vitamin B-12 (CYANOCOBALAMIN) 1000 MCG tablet Take 1,000 mcg by mouth daily.    Marland Kitchen warfarin (COUMADIN) 5 MG tablet Take 2 tablets by mouth once daily 60 tablet 5   No current facility-administered medications on file prior to visit.     Past Medical History:  Diagnosis Date  . Arthritis    both shoulders  . Atrial fibrillation ([place])   . Breast cancer ([facility])   . Cancer ([facility])    left breast  . Cataracts, bilateral   . Diverticulitis   . Diverticulosis   . Hemorrhoids    internal  . Hiatal hernia   . Hyperlipidemia    takes Zocor daily  .  Hypertension    takes Amlodipine daily  . Joint pain    both shoulders  . Macular degeneration 02/2003  . Nasal congestion   . Nocturia   . Obesity   . Osteoporosis   . PONV (postoperative nausea and vomiting)   . Prediabetes   . Shortness of breath    with exertion  . Sinus drainage   . Urinary frequency   . Urinary incontinence    Past Surgical History:  Procedure Laterality Date  . bilateral knee replacements  2008  . BREAST BIOPSY  05/04/2011   Procedure: BREAST BIOPSY WITH NEEDLE LOCALIZATION;  Surgeon: Judieth Keens, DO;  Location: [facility];  Service: General;  Laterality: Left;  . CARDIOVERSION N/A 09/15/2012   Procedure: CARDIOVERSION;  Surgeon: Laverda Page, MD;  Location: [facility];  Service:  Cardiovascular;  Laterality: N/A;  h&p in file-HW  . COLONOSCOPY    . ESOPHAGOGASTRODUODENOSCOPY    . HEMORRHOID SURGERY     outpatient "done in doctor's office"  . JOINT REPLACEMENT  2008   bilateral knee replacement  . left breast biopsy  1984  . right breast biopsy  1993    No Known Allergies Social History   Socioeconomic History  . Marital status: Widowed    Spouse name: Not on file  . Number of children: Not on file  . Years of education: Not on file  . Highest education level: Not on file  Occupational History  . Occupation: retired  Scientific laboratory technician  . Financial resource strain: Not on file  . Food insecurity    Worry: Not on file    Inability: Not on file  . Transportation needs    Medical: Not on file    Non-medical: Not on file  Tobacco Use  . Smoking status: Former Smoker    Packs/day: 0.25    Years: 10.00    Pack years: 2.50    Types: Cigarettes    Quit date: 05/21/1971    Years since quitting: 47.7  . Smokeless tobacco: Never Used  . Tobacco comment: quit in the 60's  Substance and Sexual Activity  . Alcohol use: No  . Drug use: No  . Sexual activity: Not Currently  Lifestyle  . Physical activity    Days per week: Not on file    Minutes per session: Not on file  . Stress: Not on file  Relationships  . Social Herbalist on phone: Not on file    Gets together: Not on file    Attends religious service: Not on file    Active member of club or organization: Not on file    Attends meetings of clubs or organizations: Not on file    Relationship status: Not on file  . Intimate partner violence    Fear of current or ex partner: Not on file    Emotionally abused: Not on file    Physically abused: Not on file    Forced sexual activity: Not on file  Other Topics Concern  . Not on file  Social History Narrative  . Not on file      Review of Systems  All other systems reviewed and are negative.      Objective:   Physical Exam Vitals  signs reviewed.  Constitutional:      General: She is not in acute distress.    Appearance: She is well-developed.  Cardiovascular:     Rate and Rhythm: Normal rate. Rhythm irregularly irregular.  Heart sounds: Normal heart sounds.  Pulmonary:     Effort: Pulmonary effort is normal. No respiratory distress.     Breath sounds: Normal breath sounds. No wheezing or rales.  Abdominal:     General: Bowel sounds are normal.     Palpations: Abdomen is soft.  Musculoskeletal:     Lumbar back: She exhibits decreased range of motion and tenderness. She exhibits no bony tenderness, no pain and no spasm.       Back:  Neurological:     Mental Status: She is alert and oriented to person, place, and time.     Cranial Nerves: No cranial nerve deficit.     Motor: No abnormal muscle tone.     Coordination: Coordination normal.     Deep Tendon Reflexes: Reflexes are normal and symmetric.                    Assessment & Plan:      Chronic atrial fibrillation ([place]) - Plan: PT with INR/Fingerstick  DDD (degenerative disc disease), lumbar  INR today is therapeutic.  Make no change in her Coumadin dose and recheck INR in 6 weeks.  X-ray shows severe multilevel degenerative disc disease in the lumbar spine.  At the present time Tylenol with occasional hydrocodone is providing symptomatic relief.  No further treatment is necessary unless patient develops severe unrelenting low back pain.  Given her age and medical comorbidities, the patient would unfortunately be a very poor surgical candidate.

## 2019-02-07 NOTE — Addendum Note (Signed)
Addended by: Jenna Luo T on: 02/07/2019 04:49 PM   Modules accepted: Orders

## 2019-02-15 DIAGNOSIS — Z853 Personal history of malignant neoplasm of breast: Secondary | ICD-10-CM | POA: Diagnosis not present

## 2019-02-15 DIAGNOSIS — Z1231 Encounter for screening mammogram for malignant neoplasm of breast: Secondary | ICD-10-CM | POA: Diagnosis not present

## 2019-03-05 ENCOUNTER — Other Ambulatory Visit: Payer: Self-pay | Admitting: Family Medicine

## 2019-03-28 ENCOUNTER — Ambulatory Visit (INDEPENDENT_AMBULATORY_CARE_PROVIDER_SITE_OTHER): Payer: Medicare Other | Admitting: Family Medicine

## 2019-03-28 ENCOUNTER — Other Ambulatory Visit: Payer: Self-pay

## 2019-03-28 ENCOUNTER — Encounter: Payer: Self-pay | Admitting: Family Medicine

## 2019-03-28 VITALS — BP 104/62 | HR 70 | Temp 96.9°F | Resp 14 | Ht 60.0 in | Wt 151.0 lb

## 2019-03-28 DIAGNOSIS — M25511 Pain in right shoulder: Secondary | ICD-10-CM | POA: Diagnosis not present

## 2019-03-28 DIAGNOSIS — I482 Chronic atrial fibrillation, unspecified: Secondary | ICD-10-CM

## 2019-03-28 LAB — PT WITH INR/FINGERSTICK
INR, fingerstick: 2.8 ratio — ABNORMAL HIGH
PT, fingerstick: 33.6 s — ABNORMAL HIGH (ref 10.5–13.1)

## 2019-03-28 NOTE — Progress Notes (Signed)
Subjective:    Patient ID: Kathy [name], female    DOB: 26-Sep-1927, 85 y.o.   MRN: [id number] 12/26/18 Here to recheck INR.  INR today is therapeutic at 2.7.  Continues to have low back pain at the level of the sacrum.  It radiates into her left gluteal area.  Hurts with standing and walking.  She denies any falls or injuries.  There is no tenderness to palpation over the spinous processes or over the coccyx itself.  She denies any saddle anesthesia or bowel or bladder incontinence. Mild ttp over the superior left gluteus.   At that time, my plan was: INR is therapeutic.  Recheck INR in 6 weeks.   I suspect the low back pain is likely due to degenerative disc disease in the lumbar spine and muscle spasms. Try cautiously zanalex 2-4 mg poq 8 hrs prn muscle pain given the fact patient is on Coumadin.  Obtaine l-spine xray.  02/07/19 X-ray shows severe degenerative disc disease all throughout the lumbar spine.  Please see the x-ray report from September.  At the present time Tylenol is helping the patient's pain.  She occasionally takes hydrocodone.  She continues to deny any lumbar radiculopathy.  She denies any weakness in her legs or bowel or bladder incontinence.  Patient is here today to recheck her INR.  At that time, my plan was: INR today is therapeutic.  Make no change in her Coumadin dose and recheck INR in 6 weeks.  X-ray shows severe multilevel degenerative disc disease in the lumbar spine.  At the present time Tylenol with occasional hydrocodone is providing symptomatic relief.  No further treatment is necessary unless patient develops severe unrelenting low back pain.  Given her age and medical comorbidities, the patient would unfortunately be a very poor surgical candidate.   03/28/19 Patient is here today to recheck her INR regarding her Coumadin which she takes for atrial fibrillation.  She denies any chest pain or shortness of breath or palpitations or syncope or near syncope.   She denies any lightheadedness.  She is also requesting a cortisone injection in her right shoulder.  Her last cortisone injection in her shoulder was in July.  Over the last several months she has gradually developed pain in her right shoulder.  She has pain with abduction greater than 90 degrees.  Her shoulder aches and throbs constantly.  It causes her difficulty sleeping.  She is unable to take NSAIDs due to her chronic anticoagulation.  Current Outpatient Medications on File Prior to Visit  Medication Sig Dispense Refill  . Calcium Carbonate-Vitamin D (CALTRATE 600+D) 600-400 MG-UNIT per tablet Take 1 tablet by mouth 2 (two) times daily.      . cholecalciferol (VITAMIN D) 400 UNITS TABS Take 400 Units by mouth daily. Take in addition to Caltrate with Vitamin D to get 1200 units of Vitamin D    . digoxin (LANOXIN) 0.125 MG tablet TAKE 1 TABLET BY MOUTH ONCE DAILY ( DO NOT TAKE ON SATURDAY ON SUNDAY ) 90 tablet 3  . hydrochlorothiazide (HYDRODIURIL) 25 MG tablet Take 1 tablet by mouth daily 90 tablet 3  . HYDROcodone-acetaminophen (NORCO) 5-325 MG tablet Take 1 tablet by mouth every 6 (six) hours as needed for moderate pain. 60 tablet 0  . lisinopril (ZESTRIL) 20 MG tablet TAKE 1 TABLET BY MOUTH DAILY ( THIS REPLACES LOSARTAN) 90 tablet 0  . metoprolol tartrate (LOPRESSOR) 50 MG tablet Take 1 tablet by mouth twice daily  180 tablet 2  . oxyCODONE-acetaminophen (PERCOCET) 5-325 MG tablet Take 1 tablet by mouth every 4 (four) hours as needed for severe pain (stop hydrocodone). 30 tablet 0  . simvastatin (ZOCOR) 20 MG tablet TAKE 1 TABLET BY MOUTH AT BEDTIME 90 tablet 2  . tiZANidine (ZANAFLEX) 4 MG tablet Take 1 tablet (4 mg total) by mouth every 8 (eight) hours as needed for muscle spasms ((try taking 1/2 pill at first)). 30 tablet 0  . vitamin B-12 (CYANOCOBALAMIN) 1000 MCG tablet Take 1,000 mcg by mouth daily.    Marland Kitchen warfarin (COUMADIN) 5 MG tablet Take 2 tablets by mouth once daily (Patient taking  differently: MWF 2.5 mg -  5mg  other days) 60 tablet 5   No current facility-administered medications on file prior to visit.     Past Medical History:  Diagnosis Date  . Arthritis    both shoulders  . Atrial fibrillation ([place])   . Breast cancer ([place])   . Cancer ([place])    left breast  . Cataracts, bilateral   . Diverticulitis   . Diverticulosis   . Hemorrhoids    internal  . Hiatal hernia   . Hyperlipidemia    takes Zocor daily  . Hypertension    takes Amlodipine daily  . Joint pain    both shoulders  . Macular degeneration 02/2003  . Nasal congestion   . Nocturia   . Obesity   . Osteoporosis   . PONV (postoperative nausea and vomiting)   . Prediabetes   . Shortness of breath    with exertion  . Sinus drainage   . Urinary frequency   . Urinary incontinence    Past Surgical History:  Procedure Laterality Date  . bilateral knee replacements  2008  . BREAST BIOPSY  05/04/2011   Procedure: BREAST BIOPSY WITH NEEDLE LOCALIZATION;  Surgeon: Judieth Keens, DO;  Location: [facility];  Service: General;  Laterality: Left;  . CARDIOVERSION N/A 09/15/2012   Procedure: CARDIOVERSION;  Surgeon: Laverda Page, MD;  Location: [facility];  Service: Cardiovascular;  Laterality: N/A;  h&p in file-HW  . COLONOSCOPY    . ESOPHAGOGASTRODUODENOSCOPY    . HEMORRHOID SURGERY     outpatient "done in doctor's office"  . JOINT REPLACEMENT  2008   bilateral knee replacement  . left breast biopsy  1984  . right breast biopsy  1993    No Known Allergies Social History   Socioeconomic History  . Marital status: Widowed    Spouse name: Not on file  . Number of children: Not on file  . Years of education: Not on file  . Highest education level: Not on file  Occupational History  . Occupation: retired  Scientific laboratory technician  . Financial resource strain: Not on file  . Food insecurity    Worry: Not on file    Inability: Not on file  . Transportation needs    Medical: Not on file     Non-medical: Not on file  Tobacco Use  . Smoking status: Former Smoker    Packs/day: 0.25    Years: 10.00    Pack years: 2.50    Types: Cigarettes    Quit date: 05/21/1971    Years since quitting: 47.8  . Smokeless tobacco: Never Used  . Tobacco comment: quit in the 60's  Substance and Sexual Activity  . Alcohol use: No  . Drug use: No  . Sexual activity: Not Currently  Lifestyle  . Physical activity    Days  per week: Not on file    Minutes per session: Not on file  . Stress: Not on file  Relationships  . Social Herbalist on phone: Not on file    Gets together: Not on file    Attends religious service: Not on file    Active member of club or organization: Not on file    Attends meetings of clubs or organizations: Not on file    Relationship status: Not on file  . Intimate partner violence    Fear of current or ex partner: Not on file    Emotionally abused: Not on file    Physically abused: Not on file    Forced sexual activity: Not on file  Other Topics Concern  . Not on file  Social History Narrative  . Not on file      Review of Systems  All other systems reviewed and are negative.      Objective:   Physical Exam Vitals signs reviewed.  Constitutional:      General: She is not in acute distress.    Appearance: She is well-developed.  Cardiovascular:     Rate and Rhythm: Normal rate. Rhythm irregular.     Heart sounds: Normal heart sounds.  Pulmonary:     Effort: Pulmonary effort is normal. No respiratory distress.     Breath sounds: Normal breath sounds. No wheezing or rales.  Abdominal:     General: Bowel sounds are normal.     Palpations: Abdomen is soft.  Musculoskeletal:     Right shoulder: She exhibits decreased range of motion, tenderness, pain and decreased strength.  Neurological:     Mental Status: She is alert.     Motor: No abnormal muscle tone.     Deep Tendon Reflexes: Reflexes are normal and symmetric.                     Assessment & Plan:     Acute pain of right shoulder  Chronic atrial fibrillation (HCC) - Plan: PT with INR/Fingerstick   INR is 2.8 Using sterile technique, I injected the right shoulder with a mixture of 2 cc lidocaine, 2 cc of Marcaine, and 2 cc of 40 mg/mL Kenalog.  The patient tolerated the procedure well without complication.  INR is therapeutic.  Continue her current Coumadin dose and reassess INR in 6 weeks.

## 2019-04-02 ENCOUNTER — Other Ambulatory Visit: Payer: Self-pay | Admitting: Family Medicine

## 2019-05-09 ENCOUNTER — Ambulatory Visit (INDEPENDENT_AMBULATORY_CARE_PROVIDER_SITE_OTHER): Payer: Medicare Other | Admitting: Family Medicine

## 2019-05-09 ENCOUNTER — Other Ambulatory Visit: Payer: Self-pay

## 2019-05-09 DIAGNOSIS — I482 Chronic atrial fibrillation, unspecified: Secondary | ICD-10-CM | POA: Diagnosis not present

## 2019-05-09 LAB — PT WITH INR/FINGERSTICK
INR, fingerstick: 2.6 ratio — ABNORMAL HIGH
PT, fingerstick: 30.7 s — ABNORMAL HIGH (ref 10.5–13.1)

## 2019-05-09 NOTE — Progress Notes (Signed)
Subjective:    Patient ID: Kathy [name], female    DOB: May 06, 1927, 86 y.o.   MRN: [id number]   03/28/19 Patient is here today to recheck her INR regarding her Coumadin which she takes for atrial fibrillation.  She denies any chest pain or shortness of breath or palpitations or syncope or near syncope.  She denies any lightheadedness.  INR is therapeutic at 2.6 when she gets those numbers  Current Outpatient Medications on File Prior to Visit  Medication Sig Dispense Refill  . Calcium Carbonate-Vitamin D (CALTRATE 600+D) 600-400 MG-UNIT per tablet Take 1 tablet by mouth 2 (two) times daily.      . cholecalciferol (VITAMIN D) 400 UNITS TABS Take 400 Units by mouth daily. Take in addition to Caltrate with Vitamin D to get 1200 units of Vitamin D    . digoxin (LANOXIN) 0.125 MG tablet TAKE 1 TABLET BY MOUTH ONCE DAILY ( DO NOT TAKE ON SATURDAY ON SUNDAY ) 90 tablet 3  . hydrochlorothiazide (HYDRODIURIL) 25 MG tablet Take 1 tablet by mouth daily 90 tablet 3  . HYDROcodone-acetaminophen (NORCO) 5-325 MG tablet Take 1 tablet by mouth every 6 (six) hours as needed for moderate pain. 60 tablet 0  . lisinopril (ZESTRIL) 20 MG tablet Take 1 tablet (20 mg total) by mouth daily. 90 tablet 3  . metoprolol tartrate (LOPRESSOR) 50 MG tablet Take 1 tablet by mouth twice daily 180 tablet 2  . oxyCODONE-acetaminophen (PERCOCET) 5-325 MG tablet Take 1 tablet by mouth every 4 (four) hours as needed for severe pain (stop hydrocodone). 30 tablet 0  . simvastatin (ZOCOR) 20 MG tablet TAKE 1 TABLET BY MOUTH AT BEDTIME 90 tablet 2  . tiZANidine (ZANAFLEX) 4 MG tablet Take 1 tablet (4 mg total) by mouth every 8 (eight) hours as needed for muscle spasms ((try taking 1/2 pill at first)). 30 tablet 0  . vitamin B-12 (CYANOCOBALAMIN) 1000 MCG tablet Take 1,000 mcg by mouth daily.    Marland Kitchen warfarin (COUMADIN) 5 MG tablet Take 2 tablets by mouth once daily (Patient taking differently: MWF 2.5 mg -  5mg  other days) 60  tablet 5   No current facility-administered medications on file prior to visit.    Past Medical History:  Diagnosis Date  . Arthritis    both shoulders  . Atrial fibrillation ([place])   . Breast cancer ([place])   . Cancer ([place])    left breast  . Cataracts, bilateral   . Diverticulitis   . Diverticulosis   . Hemorrhoids    internal  . Hiatal hernia   . Hyperlipidemia    takes Zocor daily  . Hypertension    takes Amlodipine daily  . Joint pain    both shoulders  . Macular degeneration 02/2003  . Nasal congestion   . Nocturia   . Obesity   . Osteoporosis   . PONV (postoperative nausea and vomiting)   . Prediabetes   . Shortness of breath    with exertion  . Sinus drainage   . Urinary frequency   . Urinary incontinence    Past Surgical History:  Procedure Laterality Date  . bilateral knee replacements  2008  . BREAST BIOPSY  05/04/2011   Procedure: BREAST BIOPSY WITH NEEDLE LOCALIZATION;  Surgeon: Judieth Keens, DO;  Location: [facility];  Service: General;  Laterality: Left;  . CARDIOVERSION N/A 09/15/2012   Procedure: CARDIOVERSION;  Surgeon: Laverda Page, MD;  Location: [facility];  Service: Cardiovascular;  Laterality: N/A;  h&p in file-HW  . COLONOSCOPY    . ESOPHAGOGASTRODUODENOSCOPY    . HEMORRHOID SURGERY     outpatient "done in doctor's office"  . JOINT REPLACEMENT  2008   bilateral knee replacement  . left breast biopsy  1984  . right breast biopsy  1993    No Known Allergies Social History   Socioeconomic History  . Marital status: Widowed    Spouse name: Not on file  . Number of children: Not on file  . Years of education: Not on file  . Highest education level: Not on file  Occupational History  . Occupation: retired  Tobacco Use  . Smoking status: Former Smoker    Packs/day: 0.25    Years: 10.00    Pack years: 2.50    Types: Cigarettes    Quit date: 05/21/1971    Years since quitting: 48.0  . Smokeless tobacco: Never Used  . Tobacco  comment: quit in the 60's  Substance and Sexual Activity  . Alcohol use: No  . Drug use: No  . Sexual activity: Not Currently  Other Topics Concern  . Not on file  Social History Narrative  . Not on file   Social Determinants of Health   Financial Resource Strain:   . Difficulty of Paying Living Expenses: Not on file  Food Insecurity:   . Worried About Charity fundraiser in the Last Year: Not on file  . Ran Out of Food in the Last Year: Not on file  Transportation Needs:   . Lack of Transportation (Medical): Not on file  . Lack of Transportation (Non-Medical): Not on file  Physical Activity:   . Days of Exercise per Week: Not on file  . Minutes of Exercise per Session: Not on file  Stress:   . Feeling of Stress : Not on file  Social Connections:   . Frequency of Communication with Friends and Family: Not on file  . Frequency of Social Gatherings with Friends and Family: Not on file  . Attends Religious Services: Not on file  . Active Member of Clubs or Organizations: Not on file  . Attends Archivist Meetings: Not on file  . Marital Status: Not on file  Intimate Partner Violence:   . Fear of Current or Ex-Partner: Not on file  . Emotionally Abused: Not on file  . Physically Abused: Not on file  . Sexually Abused: Not on file      Review of Systems  All other systems reviewed and are negative.      Objective:   Physical Exam Vitals reviewed.  Constitutional:      General: She is not in acute distress.    Appearance: She is well-developed.  Cardiovascular:     Rate and Rhythm: Normal rate. Rhythm irregular.     Heart sounds: Normal heart sounds.  Pulmonary:     Effort: Pulmonary effort is normal. No respiratory distress.     Breath sounds: Normal breath sounds. No wheezing or rales.  Abdominal:     General: Bowel sounds are normal.     Palpations: Abdomen is soft.  Neurological:     Mental Status: She is alert.     Motor: No abnormal muscle  tone.     Deep Tendon Reflexes: Reflexes are normal and symmetric.                    Assessment & Plan:     Chronic atrial fibrillation ([place]) -  Plan: PT with INR/Fingerstick   INR is 2.6.  Recheck INR in 6 weeks.

## 2019-05-29 ENCOUNTER — Other Ambulatory Visit: Payer: Self-pay | Admitting: Family Medicine

## 2019-05-29 DIAGNOSIS — M8589 Other specified disorders of bone density and structure, multiple sites: Secondary | ICD-10-CM | POA: Diagnosis not present

## 2019-05-29 DIAGNOSIS — M8580 Other specified disorders of bone density and structure, unspecified site: Secondary | ICD-10-CM

## 2019-05-29 LAB — HM DEXA SCAN

## 2019-06-06 ENCOUNTER — Encounter: Payer: Self-pay | Admitting: *Deleted

## 2019-07-04 ENCOUNTER — Ambulatory Visit (INDEPENDENT_AMBULATORY_CARE_PROVIDER_SITE_OTHER): Payer: Medicare Other | Admitting: Family Medicine

## 2019-07-04 ENCOUNTER — Other Ambulatory Visit: Payer: Self-pay

## 2019-07-04 VITALS — BP 124/82 | HR 66 | Temp 97.6°F | Resp 18 | Ht 60.0 in | Wt 148.0 lb

## 2019-07-04 DIAGNOSIS — I482 Chronic atrial fibrillation, unspecified: Secondary | ICD-10-CM

## 2019-07-04 LAB — PT WITH INR/FINGERSTICK
INR, fingerstick: 1.9 ratio — ABNORMAL HIGH
PT, fingerstick: 22.3 s — ABNORMAL HIGH (ref 10.5–13.1)

## 2019-07-04 NOTE — Progress Notes (Signed)
Subjective:    Patient ID: Kathy [name], female    DOB: 11/25/1927, 86 y.o.   MRN: [id number]  Patient is here today to recheck her INR regarding her Coumadin which she takes for atrial fibrillation.  She denies any chest pain or shortness of breath or palpitations or syncope or near syncope.  She denies any lightheadedness.  INR is subtherapeutic at 1.9. Current Outpatient Medications on File Prior to Visit  Medication Sig Dispense Refill  . Calcium Carbonate-Vitamin D (CALTRATE 600+D) 600-400 MG-UNIT per tablet Take 1 tablet by mouth 2 (two) times daily.      . cholecalciferol (VITAMIN D) 400 UNITS TABS Take 400 Units by mouth daily. Take in addition to Caltrate with Vitamin D to get 1200 units of Vitamin D    . digoxin (LANOXIN) 0.125 MG tablet TAKE 1 TABLET BY MOUTH ONCE DAILY ( DO NOT TAKE ON SATURDAY ON SUNDAY ) 90 tablet 3  . hydrochlorothiazide (HYDRODIURIL) 25 MG tablet Take 1 tablet by mouth daily 90 tablet 3  . HYDROcodone-acetaminophen (NORCO) 5-325 MG tablet Take 1 tablet by mouth every 6 (six) hours as needed for moderate pain. 60 tablet 0  . lisinopril (ZESTRIL) 20 MG tablet Take 1 tablet (20 mg total) by mouth daily. 90 tablet 3  . metoprolol tartrate (LOPRESSOR) 50 MG tablet Take 1 tablet by mouth twice daily 180 tablet 2  . oxyCODONE-acetaminophen (PERCOCET) 5-325 MG tablet Take 1 tablet by mouth every 4 (four) hours as needed for severe pain (stop hydrocodone). 30 tablet 0  . simvastatin (ZOCOR) 20 MG tablet TAKE 1 TABLET BY MOUTH AT BEDTIME 90 tablet 2  . vitamin B-12 (CYANOCOBALAMIN) 1000 MCG tablet Take 1,000 mcg by mouth daily.    Marland Kitchen warfarin (COUMADIN) 5 MG tablet Take 2 tablets by mouth once daily (Patient taking differently: MWF 2.5 mg -  5mg  other days) 60 tablet 5   No current facility-administered medications on file prior to visit.    Past Medical History:  Diagnosis Date  . Arthritis    both shoulders  . Atrial fibrillation ([place])   . Breast cancer  ([place])   . Cancer ([place])    left breast  . Cataracts, bilateral   . Diverticulitis   . Diverticulosis   . Hemorrhoids    internal  . Hiatal hernia   . Hyperlipidemia    takes Zocor daily  . Hypertension    takes Amlodipine daily  . Joint pain    both shoulders  . Macular degeneration 02/2003  . Nasal congestion   . Nocturia   . Obesity   . Osteoporosis   . PONV (postoperative nausea and vomiting)   . Prediabetes   . Shortness of breath    with exertion  . Sinus drainage   . Urinary frequency   . Urinary incontinence    Past Surgical History:  Procedure Laterality Date  . bilateral knee replacements  2008  . BREAST BIOPSY  05/04/2011   Procedure: BREAST BIOPSY WITH NEEDLE LOCALIZATION;  Surgeon: Judieth Keens, DO;  Location: [facility];  Service: General;  Laterality: Left;  . CARDIOVERSION N/A 09/15/2012   Procedure: CARDIOVERSION;  Surgeon: Laverda Page, MD;  Location: [facility];  Service: Cardiovascular;  Laterality: N/A;  h&p in file-HW  . COLONOSCOPY    . ESOPHAGOGASTRODUODENOSCOPY    . HEMORRHOID SURGERY     outpatient "done in doctor's office"  . JOINT REPLACEMENT  2008   bilateral knee replacement  .  left breast biopsy  1984  . right breast biopsy  1993    No Known Allergies Social History   Socioeconomic History  . Marital status: Widowed    Spouse name: Not on file  . Number of children: Not on file  . Years of education: Not on file  . Highest education level: Not on file  Occupational History  . Occupation: retired  Tobacco Use  . Smoking status: Former Smoker    Packs/day: 0.25    Years: 10.00    Pack years: 2.50    Types: Cigarettes    Quit date: 05/21/1971    Years since quitting: 48.1  . Smokeless tobacco: Never Used  . Tobacco comment: quit in the 60's  Substance and Sexual Activity  . Alcohol use: No  . Drug use: No  . Sexual activity: Not Currently  Other Topics Concern  . Not on file  Social History Narrative  . Not on file    Social Determinants of Health   Financial Resource Strain:   . Difficulty of Paying Living Expenses: Not on file  Food Insecurity:   . Worried About Charity fundraiser in the Last Year: Not on file  . Ran Out of Food in the Last Year: Not on file  Transportation Needs:   . Lack of Transportation (Medical): Not on file  . Lack of Transportation (Non-Medical): Not on file  Physical Activity:   . Days of Exercise per Week: Not on file  . Minutes of Exercise per Session: Not on file  Stress:   . Feeling of Stress : Not on file  Social Connections:   . Frequency of Communication with Friends and Family: Not on file  . Frequency of Social Gatherings with Friends and Family: Not on file  . Attends Religious Services: Not on file  . Active Member of Clubs or Organizations: Not on file  . Attends Archivist Meetings: Not on file  . Marital Status: Not on file  Intimate Partner Violence:   . Fear of Current or Ex-Partner: Not on file  . Emotionally Abused: Not on file  . Physically Abused: Not on file  . Sexually Abused: Not on file      Review of Systems  All other systems reviewed and are negative.      Objective:   Physical Exam Vitals reviewed.  Constitutional:      General: She is not in acute distress.    Appearance: She is well-developed.  Cardiovascular:     Rate and Rhythm: Normal rate. Rhythm irregular.     Heart sounds: Normal heart sounds.  Pulmonary:     Effort: Pulmonary effort is normal. No respiratory distress.     Breath sounds: Normal breath sounds. No wheezing or rales.  Abdominal:     General: Bowel sounds are normal.     Palpations: Abdomen is soft.  Neurological:     Mental Status: She is alert.     Motor: No abnormal muscle tone.     Deep Tendon Reflexes: Reflexes are normal and symmetric.                    Assessment & Plan:     Chronic atrial fibrillation ([place]) - Plan: PT with INR/Fingerstick  Patient has been  taking half a tablet on Monday, Wednesday, and Friday and a whole tablet on all other days.  I have asked her to take a whole tablet tomorrow and then resume her previous  dosing schedule of half a tablet on Monday Wednesday Friday at whole tablet on the other days.  She previously has been very consistent and remaining therapeutic.  Therefore I will not make any permanent changes in her dosing schedule unless she consistently runs low again next time.  Recheck in 6 weeks.  Patient has not had any lab work in quite some time so we will check a CBC, CMP today with the patient is present.  Blood pressure remains well controlled and heart rate remains well controlled.  Recently had a bone density test which revealed osteopenia with a T score of -1.9.  Patient is currently taking calcium and vitamin D.  At the present time I would not recommend any other medication such as Fosamax.

## 2019-07-05 LAB — COMPLETE METABOLIC PANEL WITH GFR
AG Ratio: 2 (calc) (ref 1.0–2.5)
ALT: 11 U/L (ref 6–29)
AST: 14 U/L (ref 10–35)
Albumin: 3.9 g/dL (ref 3.6–5.1)
Alkaline phosphatase (APISO): 43 U/L (ref 37–153)
BUN/Creatinine Ratio: 33 (calc) — ABNORMAL HIGH (ref 6–22)
BUN: 34 mg/dL — ABNORMAL HIGH (ref 7–25)
CO2: 26 mmol/L (ref 20–32)
Calcium: 9.5 mg/dL (ref 8.6–10.4)
Chloride: 102 mmol/L (ref 98–110)
Creat: 1.02 mg/dL — ABNORMAL HIGH (ref 0.60–0.88)
GFR, Est African American: 56 mL/min/{1.73_m2} — ABNORMAL LOW (ref >=60)
GFR, Est Non African American: 49 mL/min/{1.73_m2} — ABNORMAL LOW (ref >=60)
Globulin: 2 g/dL (calc) (ref 1.9–3.7)
Glucose, Bld: 98 mg/dL (ref 65–99)
Potassium: 4.4 mmol/L (ref 3.5–5.3)
Sodium: 139 mmol/L (ref 135–146)
Total Bilirubin: 1 mg/dL (ref 0.2–1.2)
Total Protein: 5.9 g/dL — ABNORMAL LOW (ref 6.1–8.1)

## 2019-07-05 LAB — CBC WITH DIFFERENTIAL/PLATELET
Absolute Monocytes: 766 cells/uL (ref 200–950)
Basophils Absolute: 53 cells/uL (ref 0–200)
Basophils Relative: 0.6 %
Eosinophils Absolute: 141 cells/uL (ref 15–500)
Eosinophils Relative: 1.6 %
HCT: 36.1 % (ref 35.0–45.0)
Hemoglobin: 11.9 g/dL (ref 11.7–15.5)
Lymphs Abs: 2649 cells/uL (ref 850–3900)
MCH: 34.4 pg — ABNORMAL HIGH (ref 27.0–33.0)
MCHC: 33 g/dL (ref 32.0–36.0)
MCV: 104.3 fL — ABNORMAL HIGH (ref 80.0–100.0)
MPV: 10.8 fL (ref 7.5–12.5)
Monocytes Relative: 8.7 %
Neutro Abs: 5192 cells/uL (ref 1500–7800)
Neutrophils Relative %: 59 %
Platelets: 201 10*3/uL (ref 140–400)
RBC: 3.46 10*6/uL — ABNORMAL LOW (ref 3.80–5.10)
RDW: 13.4 % (ref 11.0–15.0)
Total Lymphocyte: 30.1 %
WBC: 8.8 10*3/uL (ref 3.8–10.8)

## 2019-07-07 ENCOUNTER — Encounter: Payer: Self-pay | Admitting: *Deleted

## 2019-07-07 DIAGNOSIS — Z9842 Cataract extraction status, left eye: Secondary | ICD-10-CM | POA: Diagnosis not present

## 2019-07-07 DIAGNOSIS — H353132 Nonexudative age-related macular degeneration, bilateral, intermediate dry stage: Secondary | ICD-10-CM | POA: Diagnosis not present

## 2019-07-07 DIAGNOSIS — H52223 Regular astigmatism, bilateral: Secondary | ICD-10-CM | POA: Diagnosis not present

## 2019-07-07 DIAGNOSIS — H26493 Other secondary cataract, bilateral: Secondary | ICD-10-CM | POA: Diagnosis not present

## 2019-07-07 DIAGNOSIS — Z9841 Cataract extraction status, right eye: Secondary | ICD-10-CM | POA: Diagnosis not present

## 2019-08-06 ENCOUNTER — Other Ambulatory Visit: Payer: Self-pay | Admitting: Family Medicine

## 2019-08-14 DIAGNOSIS — H26491 Other secondary cataract, right eye: Secondary | ICD-10-CM | POA: Diagnosis not present

## 2019-08-14 DIAGNOSIS — H26492 Other secondary cataract, left eye: Secondary | ICD-10-CM | POA: Diagnosis not present

## 2019-08-14 DIAGNOSIS — Z961 Presence of intraocular lens: Secondary | ICD-10-CM | POA: Diagnosis not present

## 2019-08-17 ENCOUNTER — Other Ambulatory Visit: Payer: Self-pay

## 2019-08-17 ENCOUNTER — Ambulatory Visit (INDEPENDENT_AMBULATORY_CARE_PROVIDER_SITE_OTHER): Payer: Medicare Other | Admitting: Family Medicine

## 2019-08-17 DIAGNOSIS — I482 Chronic atrial fibrillation, unspecified: Secondary | ICD-10-CM | POA: Diagnosis not present

## 2019-08-17 LAB — PT WITH INR/FINGERSTICK
INR, fingerstick: 3.4 ratio — ABNORMAL HIGH
PT, fingerstick: 41.1 s — ABNORMAL HIGH (ref 10.5–13.1)

## 2019-08-17 NOTE — Progress Notes (Signed)
Subjective:    Patient ID: Kathy [name], female    DOB: 1927-05-21, 86 y.o.   MRN: [id number] INR today is supratherapeutic at 3.4.  Last time her INR was subtherapeutic.  She tends to consistently be between 2 and 3 therefore I hesitate to make any long-term changes.  She denies any bleeding or bruising or epistaxis, etc.  She is currently taking 5 mg Tuesday, Thursday, Saturday, and Sunday.  She has already taken today's dose.  She takes 2.5 mg on Monday, Wednesday, and Friday.  Current Outpatient Medications on File Prior to Visit  Medication Sig Dispense Refill  . Calcium Carbonate-Vitamin D (CALTRATE 600+D) 600-400 MG-UNIT per tablet Take 1 tablet by mouth 2 (two) times daily.      . cholecalciferol (VITAMIN D) 400 UNITS TABS Take 400 Units by mouth daily. Take in addition to Caltrate with Vitamin D to get 1200 units of Vitamin D    . digoxin (LANOXIN) 0.125 MG tablet TAKE 1 TABLET BY MOUTH ONCE DAILY ( DO NOT TAKE ON SATURDAY ON SUNDAY ) 90 tablet 3  . hydrochlorothiazide (HYDRODIURIL) 25 MG tablet Take 1 tablet by mouth daily 90 tablet 3  . HYDROcodone-acetaminophen (NORCO) 5-325 MG tablet Take 1 tablet by mouth every 6 (six) hours as needed for moderate pain. 60 tablet 0  . lisinopril (ZESTRIL) 20 MG tablet Take 1 tablet (20 mg total) by mouth daily. 90 tablet 3  . metoprolol tartrate (LOPRESSOR) 50 MG tablet Take 1 tablet by mouth twice daily 180 tablet 2  . oxyCODONE-acetaminophen (PERCOCET) 5-325 MG tablet Take 1 tablet by mouth every 4 (four) hours as needed for severe pain (stop hydrocodone). 30 tablet 0  . simvastatin (ZOCOR) 20 MG tablet TAKE 1 TABLET BY MOUTH AT BEDTIME 90 tablet 1  . vitamin B-12 (CYANOCOBALAMIN) 1000 MCG tablet Take 1,000 mcg by mouth daily.    Marland Kitchen warfarin (COUMADIN) 5 MG tablet Take 2 tablets by mouth once daily (Patient taking differently: MWF 2.5 mg -  5mg  other days) 60 tablet 5   No current facility-administered medications on file prior to visit.     Past Medical History:  Diagnosis Date  . Arthritis    both shoulders  . Atrial fibrillation ([place])   . Breast cancer ([place])   . Cancer ([place])    left breast  . Cataracts, bilateral   . Diverticulitis   . Diverticulosis   . Hemorrhoids    internal  . Hiatal hernia   . Hyperlipidemia    takes Zocor daily  . Hypertension    takes Amlodipine daily  . Joint pain    both shoulders  . Macular degeneration 02/2003  . Nasal congestion   . Nocturia   . Obesity   . Osteoporosis   . PONV (postoperative nausea and vomiting)   . Prediabetes   . Shortness of breath    with exertion  . Sinus drainage   . Urinary frequency   . Urinary incontinence    Past Surgical History:  Procedure Laterality Date  . bilateral knee replacements  2008  . BREAST BIOPSY  05/04/2011   Procedure: BREAST BIOPSY WITH NEEDLE LOCALIZATION;  Surgeon: Judieth Keens, DO;  Location: [facility];  Service: General;  Laterality: Left;  . CARDIOVERSION N/A 09/15/2012   Procedure: CARDIOVERSION;  Surgeon: Laverda Page, MD;  Location: [facility];  Service: Cardiovascular;  Laterality: N/A;  h&p in file-HW  . COLONOSCOPY    . ESOPHAGOGASTRODUODENOSCOPY    .  HEMORRHOID SURGERY     outpatient "done in doctor's office"  . JOINT REPLACEMENT  2008   bilateral knee replacement  . left breast biopsy  1984  . right breast biopsy  1993    No Known Allergies Social History   Socioeconomic History  . Marital status: Widowed    Spouse name: Not on file  . Number of children: Not on file  . Years of education: Not on file  . Highest education level: Not on file  Occupational History  . Occupation: retired  Tobacco Use  . Smoking status: Former Smoker    Packs/day: 0.25    Years: 10.00    Pack years: 2.50    Types: Cigarettes    Quit date: 05/21/1971    Years since quitting: 48.2  . Smokeless tobacco: Never Used  . Tobacco comment: quit in the 60's  Substance and Sexual Activity  . Alcohol use: No  .  Drug use: No  . Sexual activity: Not Currently  Other Topics Concern  . Not on file  Social History Narrative  . Not on file   Social Determinants of Health   Financial Resource Strain:   . Difficulty of Paying Living Expenses:   Food Insecurity:   . Worried About Charity fundraiser in the Last Year:   . Arboriculturist in the Last Year:   Transportation Needs:   . Film/video editor (Medical):   Marland Kitchen Lack of Transportation (Non-Medical):   Physical Activity:   . Days of Exercise per Week:   . Minutes of Exercise per Session:   Stress:   . Feeling of Stress :   Social Connections:   . Frequency of Communication with Friends and Family:   . Frequency of Social Gatherings with Friends and Family:   . Attends Religious Services:   . Active Member of Clubs or Organizations:   . Attends Archivist Meetings:   Marland Kitchen Marital Status:   Intimate Partner Violence:   . Fear of Current or Ex-Partner:   . Emotionally Abused:   Marland Kitchen Physically Abused:   . Sexually Abused:       Review of Systems  All other systems reviewed and are negative.      Objective:   Physical Exam Vitals reviewed.  Constitutional:      General: She is not in acute distress.    Appearance: She is well-developed.  Cardiovascular:     Rate and Rhythm: Normal rate. Rhythm irregular.     Heart sounds: Normal heart sounds.  Pulmonary:     Effort: Pulmonary effort is normal. No respiratory distress.     Breath sounds: Normal breath sounds. No wheezing or rales.  Abdominal:     General: Bowel sounds are normal.     Palpations: Abdomen is soft.  Neurological:     Mental Status: She is alert.     Motor: No abnormal muscle tone.     Deep Tendon Reflexes: Reflexes are normal and symmetric.        Assessment & Plan:     Chronic atrial fibrillation ([place]) - Plan: PT with INR/Fingerstick   Slightly supratherapeutic.  Hold tomorrow's dose.  Resume 5 mg as previously prescribed on Saturday.   Recheck INR in 6 weeks.

## 2019-08-28 DIAGNOSIS — Z961 Presence of intraocular lens: Secondary | ICD-10-CM | POA: Diagnosis not present

## 2019-08-28 DIAGNOSIS — H26491 Other secondary cataract, right eye: Secondary | ICD-10-CM | POA: Diagnosis not present

## 2019-09-29 ENCOUNTER — Other Ambulatory Visit: Payer: Self-pay

## 2019-09-29 ENCOUNTER — Ambulatory Visit (INDEPENDENT_AMBULATORY_CARE_PROVIDER_SITE_OTHER): Payer: Medicare Other | Admitting: Family Medicine

## 2019-09-29 VITALS — BP 134/76 | HR 65 | Temp 97.0°F | Ht 60.0 in | Wt 144.0 lb

## 2019-09-29 DIAGNOSIS — Z23 Encounter for immunization: Secondary | ICD-10-CM

## 2019-09-29 DIAGNOSIS — I482 Chronic atrial fibrillation, unspecified: Secondary | ICD-10-CM | POA: Diagnosis not present

## 2019-09-29 DIAGNOSIS — M19019 Primary osteoarthritis, unspecified shoulder: Secondary | ICD-10-CM | POA: Diagnosis not present

## 2019-09-29 LAB — PT WITH INR/FINGERSTICK
INR, fingerstick: 2.8 ratio — ABNORMAL HIGH
PT, fingerstick: 33.8 s — ABNORMAL HIGH (ref 10.5–13.1)

## 2019-09-29 MED ORDER — HYDROCODONE-ACETAMINOPHEN 5-325 MG PO TABS: 1.0000 | ORAL_TABLET | Freq: Four times a day (QID) | ORAL | 0 refills | Status: AC | PRN

## 2019-09-29 NOTE — Progress Notes (Signed)
Subjective:    Patient ID: Kathy [name], female    DOB: 04/28/1927, 86 y.o.   MRN: [id number]  INR today is 2.8.  She denies any bleeding or bruising.  She does request a refill on hydrocodone that she uses once a day for her shoulder pain and arthritic pain.  She takes 2.5 mg on Monday, Wednesday, and Friday.  Current Outpatient Medications on File Prior to Visit  Medication Sig Dispense Refill   Calcium Carbonate-Vitamin D (CALTRATE 600+D) 600-400 MG-UNIT per tablet Take 1 tablet by mouth 2 (two) times daily.       cholecalciferol (VITAMIN D) 400 UNITS TABS Take 400 Units by mouth daily. Take in addition to Caltrate with Vitamin D to get 1200 units of Vitamin D     digoxin (LANOXIN) 0.125 MG tablet TAKE 1 TABLET BY MOUTH ONCE DAILY ( DO NOT TAKE ON SATURDAY ON SUNDAY ) 90 tablet 3   hydrochlorothiazide (HYDRODIURIL) 25 MG tablet Take 1 tablet by mouth daily 90 tablet 3   HYDROcodone-acetaminophen (NORCO) 5-325 MG tablet Take 1 tablet by mouth every 6 (six) hours as needed for moderate pain. 60 tablet 0   lisinopril (ZESTRIL) 20 MG tablet Take 1 tablet (20 mg total) by mouth daily. 90 tablet 3   metoprolol tartrate (LOPRESSOR) 50 MG tablet Take 1 tablet by mouth twice daily 180 tablet 2   oxyCODONE-acetaminophen (PERCOCET) 5-325 MG tablet Take 1 tablet by mouth every 4 (four) hours as needed for severe pain (stop hydrocodone). 30 tablet 0   simvastatin (ZOCOR) 20 MG tablet TAKE 1 TABLET BY MOUTH AT BEDTIME 90 tablet 1   vitamin B-12 (CYANOCOBALAMIN) 1000 MCG tablet Take 1,000 mcg by mouth daily.     warfarin (COUMADIN) 5 MG tablet Take 2 tablets by mouth once daily (Patient taking differently: MWF 2.5 mg -  5mg  other days) 60 tablet 5   No current facility-administered medications on file prior to visit.    Past Medical History:  Diagnosis Date   Arthritis    both shoulders   Atrial fibrillation ([place])    Breast cancer ([place])    Cancer ([place])    left breast    Cataracts, bilateral    Diverticulitis    Diverticulosis    Hemorrhoids    internal   Hiatal hernia    Hyperlipidemia    takes Zocor daily   Hypertension    takes Amlodipine daily   Joint pain    both shoulders   Macular degeneration 02/2003   Nasal congestion    Nocturia    Obesity    Osteoporosis    PONV (postoperative nausea and vomiting)    Prediabetes    Shortness of breath    with exertion   Sinus drainage    Urinary frequency    Urinary incontinence    Past Surgical History:  Procedure Laterality Date   bilateral knee replacements  2008   BREAST BIOPSY  05/04/2011   Procedure: BREAST BIOPSY WITH NEEDLE LOCALIZATION;  Surgeon: Judieth Keens, DO;  Location: [facility];  Service: General;  Laterality: Left;   CARDIOVERSION N/A 09/15/2012   Procedure: CARDIOVERSION;  Surgeon: Laverda Page, MD;  Location: [facility];  Service: Cardiovascular;  Laterality: N/A;  h&p in file-HW   COLONOSCOPY     [address]     outpatient "done in doctor's office"   JOINT REPLACEMENT  2008   bilateral knee replacement   left  breast biopsy  1984   right breast biopsy  1993    No Known Allergies Social History   Socioeconomic History   Marital status: Widowed    Spouse name: Not on file   Number of children: Not on file   Years of education: Not on file   Highest education level: Not on file  Occupational History   Occupation: retired  Tobacco Use   Smoking status: Former Smoker    Packs/day: 0.25    Years: 10.00    Pack years: 2.50    Types: Cigarettes    Quit date: 05/21/1971    Years since quitting: 48.3   Smokeless tobacco: Never Used   Tobacco comment: quit in the 60's  Substance and Sexual Activity   Alcohol use: No   Drug use: No   Sexual activity: Not Currently  Other Topics Concern   Not on file  Social History Narrative   Not on file   Social Determinants of Health    Financial Resource Strain:    Difficulty of Paying Living Expenses:   Food Insecurity:    Worried About Charity fundraiser in the Last Year:    Arboriculturist in the Last Year:   Transportation Needs:    Film/video editor (Medical):    Lack of Transportation (Non-Medical):   Physical Activity:    Days of Exercise per Week:    Minutes of Exercise per Session:   Stress:    Feeling of Stress :   Social Connections:    Frequency of Communication with Friends and Family:    Frequency of Social Gatherings with Friends and Family:    Attends Religious Services:    Active Member of Clubs or Organizations:    Attends Music therapist:    Marital Status:   Intimate Partner Violence:    Fear of Current or Ex-Partner:    Emotionally Abused:    Physically Abused:    Sexually Abused:       Review of Systems  All other systems reviewed and are negative.      Objective:   Physical Exam Vitals reviewed.  Constitutional:      General: She is not in acute distress.    Appearance: She is well-developed.  Cardiovascular:     Rate and Rhythm: Normal rate. Rhythm irregular.     Heart sounds: Normal heart sounds.  Pulmonary:     Effort: Pulmonary effort is normal. No respiratory distress.     Breath sounds: Normal breath sounds. No wheezing or rales.  Abdominal:     General: Bowel sounds are normal.     Palpations: Abdomen is soft.  Neurological:     Mental Status: She is alert.     Motor: No abnormal muscle tone.     Deep Tendon Reflexes: Reflexes are normal and symmetric.        Assessment & Plan:     Chronic atrial fibrillation ([place]) - Plan: PT with INR/Fingerstick  Osteoarthritis of shoulder, unspecified laterality, unspecified osteoarthritis type - Plan: HYDROcodone-acetaminophen (NORCO) 5-325 MG tablet  Need for vaccination against Streptococcus pneumoniae - Plan: Pneumococcal conjugate vaccine 13-valent IM  INR is therapeutic.   No change in her Coumadin.  Recheck INR in 6 weeks.  Patient received her vaccination for Prevnar 13.  Refill the patient's hydrocodone.

## 2019-11-06 ENCOUNTER — Telehealth: Payer: Self-pay | Admitting: Family Medicine

## 2019-11-06 ENCOUNTER — Ambulatory Visit (INDEPENDENT_AMBULATORY_CARE_PROVIDER_SITE_OTHER): Payer: Medicare Other | Admitting: Family Medicine

## 2019-11-06 ENCOUNTER — Other Ambulatory Visit: Payer: Self-pay

## 2019-11-06 VITALS — BP 110/70 | HR 74 | Temp 97.7°F | Wt 141.0 lb

## 2019-11-06 DIAGNOSIS — I482 Chronic atrial fibrillation, unspecified: Secondary | ICD-10-CM | POA: Diagnosis not present

## 2019-11-06 LAB — PT WITH INR/FINGERSTICK
INR, fingerstick: 4.1 ratio — ABNORMAL HIGH
PT, fingerstick: 49 s — ABNORMAL HIGH (ref 10.5–13.1)

## 2019-11-06 NOTE — Progress Notes (Signed)
°  Chronic Care Management   Outreach Note  11/06/2019 Name: Kathy [name] MRN: [id number] DOB: Sep 03, 1927  Referred by: Susy Frizzle, MD Reason for referral : Chronic Care Management (Initial CCM Outreach)   An unsuccessful telephone outreach was attempted today. The patient was referred to the pharmacist for assistance with care management and care coordination.   Follow Up Plan:   [name]

## 2019-11-06 NOTE — Progress Notes (Signed)
Subjective:    Patient ID: Kathy [name], female    DOB: 1928/03/05, 86 y.o.   MRN: [id number]  Patient's INR today is 4.1. She is currently taking 5 mg every day of Coumadin except Monday Wednesday Friday when she takes 2.5. She has been on the same dose for quite some time with no changes. She denies any bleeding or bruising. She denies any medication changes. She denies any antibiotic use. She denies any dietary changes. Of note she has lost weight gradually over the last year. She is down 3 pounds from her last office visit. She denies any chest pain or abdominal pain or shortness of breath or melena or hematochezia or fevers. She believes it is due to her appetite declining. She admits that she is not eating supper. Instead she has breakfast and lunch.  Current Outpatient Medications on File Prior to Visit  Medication Sig Dispense Refill  . Calcium Carbonate-Vitamin D (CALTRATE 600+D) 600-400 MG-UNIT per tablet Take 1 tablet by mouth 2 (two) times daily.      . cholecalciferol (VITAMIN D) 400 UNITS TABS Take 400 Units by mouth daily. Take in addition to Caltrate with Vitamin D to get 1200 units of Vitamin D    . digoxin (LANOXIN) 0.125 MG tablet TAKE 1 TABLET BY MOUTH ONCE DAILY ( DO NOT TAKE ON SATURDAY ON SUNDAY ) 90 tablet 3  . hydrochlorothiazide (HYDRODIURIL) 25 MG tablet Take 1 tablet by mouth daily 90 tablet 3  . lisinopril (ZESTRIL) 20 MG tablet Take 1 tablet (20 mg total) by mouth daily. 90 tablet 3  . metoprolol tartrate (LOPRESSOR) 50 MG tablet Take 1 tablet by mouth twice daily 180 tablet 2  . oxyCODONE-acetaminophen (PERCOCET) 5-325 MG tablet Take 1 tablet by mouth every 4 (four) hours as needed for severe pain (stop hydrocodone). 30 tablet 0  . simvastatin (ZOCOR) 20 MG tablet TAKE 1 TABLET BY MOUTH AT BEDTIME 90 tablet 1  . vitamin B-12 (CYANOCOBALAMIN) 1000 MCG tablet Take 1,000 mcg by mouth daily.    Marland Kitchen warfarin (COUMADIN) 5 MG tablet Take 2 tablets by mouth once  daily (Patient taking differently: MWF 2.5 mg -  5mg  other days) 60 tablet 5   No current facility-administered medications on file prior to visit.    Past Medical History:  Diagnosis Date  . Arthritis    both shoulders  . Atrial fibrillation ([place])   . Breast cancer ([place])   . Cancer ([place])    left breast  . Cataracts, bilateral   . Diverticulitis   . Diverticulosis   . Hemorrhoids    internal  . Hiatal hernia   . Hyperlipidemia    takes Zocor daily  . Hypertension    takes Amlodipine daily  . Joint pain    both shoulders  . Macular degeneration 02/2003  . Nasal congestion   . Nocturia   . Obesity   . Osteoporosis   . PONV (postoperative nausea and vomiting)   . Prediabetes   . Shortness of breath    with exertion  . Sinus drainage   . Urinary frequency   . Urinary incontinence    Past Surgical History:  Procedure Laterality Date  . bilateral knee replacements  2008  . BREAST BIOPSY  05/04/2011   Procedure: BREAST BIOPSY WITH NEEDLE LOCALIZATION;  Surgeon: Judieth Keens, DO;  Location: [facility];  Service: General;  Laterality: Left;  . CARDIOVERSION N/A 09/15/2012   Procedure: CARDIOVERSION;  Surgeon: Cammy Brochure  Carlynn Herald, MD;  [facility];  Service: Cardiovascular;  Laterality: N/A;  h&p in file-HW  . COLONOSCOPY    . ESOPHAGOGASTRODUODENOSCOPY    . HEMORRHOID SURGERY     outpatient "done in doctor's office"  . JOINT REPLACEMENT  2008   bilateral knee replacement  . left breast biopsy  1984  . right breast biopsy  1993    No Known Allergies Social History   Socioeconomic History  . Marital status: Widowed    Spouse name: Not on file  . Number of children: Not on file  . Years of education: Not on file  . Highest education level: Not on file  Occupational History  . Occupation: retired  Tobacco Use  . Smoking status: Former Smoker    Packs/day: 0.25    Years: 10.00    Pack years: 2.50    Types: Cigarettes    Quit date: 05/21/1971    Years  since quitting: 48.4  . Smokeless tobacco: Never Used  . Tobacco comment: quit in the 60's  Substance and Sexual Activity  . Alcohol use: No  . Drug use: No  . Sexual activity: Not Currently  Other Topics Concern  . Not on file  Social History Narrative  . Not on file   Social Determinants of Health   Financial Resource Strain:   . Difficulty of Paying Living Expenses:   Food Insecurity:   . Worried About Charity fundraiser in the Last Year:   . Arboriculturist in the Last Year:   Transportation Needs:   . Film/video editor (Medical):   Marland Kitchen Lack of Transportation (Non-Medical):   Physical Activity:   . Days of Exercise per Week:   . Minutes of Exercise per Session:   Stress:   . Feeling of Stress :   Social Connections:   . Frequency of Communication with Friends and Family:   . Frequency of Social Gatherings with Friends and Family:   . Attends Religious Services:   . Active Member of Clubs or Organizations:   . Attends Archivist Meetings:   Marland Kitchen Marital Status:   Intimate Partner Violence:   . Fear of Current or Ex-Partner:   . Emotionally Abused:   Marland Kitchen Physically Abused:   . Sexually Abused:       Review of Systems  All other systems reviewed and are negative.      Objective:   Physical Exam Vitals reviewed.  Constitutional:      General: She is not in acute distress.    Appearance: She is well-developed.  Cardiovascular:     Rate and Rhythm: Normal rate. Rhythm irregular.     Heart sounds: Normal heart sounds.  Pulmonary:     Effort: Pulmonary effort is normal. No respiratory distress.     Breath sounds: Normal breath sounds. No wheezing or rales.  Abdominal:     General: Bowel sounds are normal.     Palpations: Abdomen is soft.  Neurological:     Mental Status: She is alert.     Motor: No abnormal muscle tone.     Deep Tendon Reflexes: Reflexes are normal and symmetric.        Assessment & Plan:     Chronic atrial fibrillation  ([place]) - Plan: PT with INR/Fingerstick  INR is supratherapeutic. Hold Coumadin for 48 hours and then resume Coumadin at 5 mg on Monday Wednesday Friday and 2.5 mg on all other days. Recheck INR in 3 weeks.  Add 1 can of Ensure daily as a protein supplement. Then recheck weight at her next office visit. If weight continues to decline will need to institute a work-up for unintentional weight loss however I believe is most likely due to age and decrease caloric intake.

## 2019-11-08 ENCOUNTER — Telehealth: Payer: Self-pay | Admitting: Family Medicine

## 2019-11-08 NOTE — Progress Notes (Signed)
  Chronic Care Management   Outreach Note  11/08/2019 Name: [name] MRN: [id number] DOB: 1928/03/16  Referred by: Susy Frizzle, MD Reason for referral : No chief complaint on file.   A second unsuccessful telephone outreach was attempted today. The patient was referred to pharmacist for assistance with care management and care coordination.  Follow Up Plan:   Prathima Ghanta [employer] Sched[employer]

## 2019-11-12 ENCOUNTER — Other Ambulatory Visit: Payer: Self-pay | Admitting: Family Medicine

## 2019-11-23 ENCOUNTER — Telehealth: Payer: Self-pay | Admitting: Family Medicine

## 2019-11-23 NOTE — Progress Notes (Signed)
  Chronic Care Management   Outreach Note  11/23/2019 Name: Kathy [name] MRN: [id number] DOB: Oct 13, 1927  Referred by: Susy Frizzle, MD Reason for referral : No chief complaint on file.   Third unsuccessful telephone outreach was attempted today. The patient was referred to the pharmacist for assistance with care management and care coordination.   Follow Up Plan:   Carley Perdue [employer] Scheduler

## 2019-11-24 ENCOUNTER — Telehealth: Payer: Self-pay | Admitting: Family Medicine

## 2019-11-24 NOTE — Progress Notes (Signed)
  Chronic Care Management   Outreach Note  11/24/2019 Name: Kathy [name] MRN: [id number] DOB: [birth year]  Referred by: Susy Frizzle, MD Reason for referral : No chief complaint on file.   Third unsuccessful telephone outreach was attempted today. The patient was referred to the pharmacist for assistance with care management and care coordination.   Follow Up Plan:   Carley Perdue [employer] Scheduler

## 2019-11-26 ENCOUNTER — Other Ambulatory Visit: Payer: Self-pay | Admitting: Family Medicine

## 2019-11-27 ENCOUNTER — Other Ambulatory Visit: Payer: Self-pay

## 2019-11-27 ENCOUNTER — Ambulatory Visit (INDEPENDENT_AMBULATORY_CARE_PROVIDER_SITE_OTHER): Payer: Medicare Other | Admitting: Family Medicine

## 2019-11-27 VITALS — BP 130/60 | HR 82 | Temp 98.2°F | Wt 147.0 lb

## 2019-11-27 DIAGNOSIS — I482 Chronic atrial fibrillation, unspecified: Secondary | ICD-10-CM | POA: Diagnosis not present

## 2019-11-27 LAB — PT WITH INR/FINGERSTICK
INR, fingerstick: 1.7 ratio — ABNORMAL HIGH
PT, fingerstick: 20.9 s — ABNORMAL HIGH (ref 10.5–13.1)

## 2019-11-27 NOTE — Progress Notes (Signed)
Subjective:    Patient ID: Kathy [name], female    DOB: 11-Nov-1927, 86 y.o.   MRN: [id number] 11/06/19 Patient's INR today is 4.1. She is currently taking 5 mg every day of Coumadin except Monday Wednesday Friday when she takes 2.5. She has been on the same dose for quite some time with no changes. She denies any bleeding or bruising. She denies any medication changes. She denies any antibiotic use. She denies any dietary changes. Of note she has lost weight gradually over the last year. She is down 3 pounds from her last office visit. She denies any chest pain or abdominal pain or shortness of breath or melena or hematochezia or fevers. She believes it is due to her appetite declining. She admits that she is not eating supper. Instead she has breakfast and lunch.  At that time, my plan was: INR is supratherapeutic. Hold Coumadin for 48 hours and then resume Coumadin at 5 mg on Monday Wednesday Friday and 2.5 mg on all other days. Recheck INR in 3 weeks. Add 1 can of Ensure daily as a protein supplement. Then recheck weight at her next office visit. If weight continues to decline will need to institute a work-up for unintentional weight loss however I believe is most likely due to age and decrease caloric intake.  11/27/19 INR today is subtherapeutic at 1.7.  However the patient has gained 6 pounds since I saw her last by drinking Ensure leading me to believe that her weight loss was likely due to protein calorie malnutrition.  Current Outpatient Medications on File Prior to Visit  Medication Sig Dispense Refill  . Calcium Carbonate-Vitamin D (CALTRATE 600+D) 600-400 MG-UNIT per tablet Take 1 tablet by mouth 2 (two) times daily.      . cholecalciferol (VITAMIN D) 400 UNITS TABS Take 400 Units by mouth daily. Take in addition to Caltrate with Vitamin D to get 1200 units of Vitamin D    . digoxin (LANOXIN) 0.125 MG tablet TAKE 1 TABLET BY MOUTH ONCE DAILY ( DO NOT TAKE ON SATURDAY ON SUNDAY ) 90  tablet 3  . hydrochlorothiazide (HYDRODIURIL) 25 MG tablet Take 1 tablet by mouth once daily 90 tablet 0  . lisinopril (ZESTRIL) 20 MG tablet Take 1 tablet (20 mg total) by mouth daily. 90 tablet 3  . metoprolol tartrate (LOPRESSOR) 50 MG tablet Take 1 tablet by mouth twice daily 180 tablet 2  . oxyCODONE-acetaminophen (PERCOCET) 5-325 MG tablet Take 1 tablet by mouth every 4 (four) hours as needed for severe pain (stop hydrocodone). 30 tablet 0  . simvastatin (ZOCOR) 20 MG tablet TAKE 1 TABLET BY MOUTH AT BEDTIME 90 tablet 1  . vitamin B-12 (CYANOCOBALAMIN) 1000 MCG tablet Take 1,000 mcg by mouth daily.    Marland Kitchen warfarin (COUMADIN) 5 MG tablet Take 2 tablets by mouth once daily (Patient taking differently: MWF 2.5 mg -  5mg  other days) 60 tablet 5   No current facility-administered medications on file prior to visit.    Past Medical History:  Diagnosis Date  . Arthritis    both shoulders  . Atrial fibrillation ([place])   . Breast cancer ([place])   . Cancer ([place])    left breast  . Cataracts, bilateral   . Diverticulitis   . Diverticulosis   . Hemorrhoids    internal  . Hiatal hernia   . Hyperlipidemia    takes Zocor daily  . Hypertension    takes Amlodipine daily  .  Joint pain    both shoulders  . Macular degeneration 02/2003  . Nasal congestion   . Nocturia   . Obesity   . Osteoporosis   . PONV (postoperative nausea and vomiting)   . Prediabetes   . Shortness of breath    with exertion  . Sinus drainage   . Urinary frequency   . Urinary incontinence    Past Surgical History:  Procedure Laterality Date  . bilateral knee replacements  2008  . BREAST BIOPSY  05/04/2011   Procedure: BREAST BIOPSY WITH NEEDLE LOCALIZATION;  Surgeon: Judieth Keens, DO;  Location: [facility];  Service: General;  Laterality: Left;  . CARDIOVERSION N/A 09/15/2012   Procedure: CARDIOVERSION;  Surgeon: Laverda Page, MD;  Location: [facility];  Service: Cardiovascular;  Laterality: N/A;  h&p in  file-HW  . COLONOSCOPY    . ESOPHAGOGASTRODUODENOSCOPY    . HEMORRHOID SURGERY     outpatient "done in doctor's office"  . JOINT REPLACEMENT  2008   bilateral knee replacement  . left breast biopsy  1984  . right breast biopsy  1993    No Known Allergies Social History   Socioeconomic History  . Marital status: Widowed    Spouse name: Not on file  . Number of children: Not on file  . Years of education: Not on file  . Highest education level: Not on file  Occupational History  . Occupation: retired  Tobacco Use  . Smoking status: Former Smoker    Packs/day: 0.25    Years: 10.00    Pack years: 2.50    Types: Cigarettes    Quit date: 05/21/1971    Years since quitting: 48.5  . Smokeless tobacco: Never Used  . Tobacco comment: quit in the 60's  Substance and Sexual Activity  . Alcohol use: No  . Drug use: No  . Sexual activity: Not Currently  Other Topics Concern  . Not on file  Social History Narrative  . Not on file   Social Determinants of Health   Financial Resource Strain:   . Difficulty of Paying Living Expenses:   Food Insecurity:   . Worried About Charity fundraiser in the Last Year:   . Arboriculturist in the Last Year:   Transportation Needs:   . Film/video editor (Medical):   Marland Kitchen Lack of Transportation (Non-Medical):   Physical Activity:   . Days of Exercise per Week:   . Minutes of Exercise per Session:   Stress:   . Feeling of Stress :   Social Connections:   . Frequency of Communication with Friends and Family:   . Frequency of Social Gatherings with Friends and Family:   . Attends Religious Services:   . Active Member of Clubs or Organizations:   . Attends Archivist Meetings:   Marland Kitchen Marital Status:   Intimate Partner Violence:   . Fear of Current or Ex-Partner:   . Emotionally Abused:   Marland Kitchen Physically Abused:   . Sexually Abused:       Review of Systems  All other systems reviewed and are negative.      Objective:    Physical Exam Vitals reviewed.  Constitutional:      General: She is not in acute distress.    Appearance: She is well-developed.  Cardiovascular:     Rate and Rhythm: Normal rate. Rhythm irregular.     Heart sounds: Normal heart sounds.  Pulmonary:     Effort: Pulmonary effort  is normal. No respiratory distress.     Breath sounds: Normal breath sounds. No wheezing or rales.  Abdominal:     General: Bowel sounds are normal.     Palpations: Abdomen is soft.  Neurological:     Mental Status: She is alert.     Motor: No abnormal muscle tone.     Deep Tendon Reflexes: Reflexes are normal and symmetric.        Assessment & Plan:     Chronic atrial fibrillation ([place]) - Plan: PT with INR/Fingerstick  INR is subtherapeutic.  Increase Coumadin to 5 mg a day on Monday, Wednesday, Friday, and Saturday.  Take 2.5 mg on Tuesday, Thursday, Sunday.  Recheck INR in 6 weeks.  Weight loss has resolved simply by adding Ensure.  Recommended a protein supplement as needed to prevent weight loss

## 2019-12-24 ENCOUNTER — Other Ambulatory Visit: Payer: Self-pay | Admitting: Family Medicine

## 2020-01-08 ENCOUNTER — Other Ambulatory Visit: Payer: Self-pay

## 2020-01-08 ENCOUNTER — Ambulatory Visit (INDEPENDENT_AMBULATORY_CARE_PROVIDER_SITE_OTHER): Payer: Medicare Other | Admitting: Family Medicine

## 2020-01-08 VITALS — BP 120/80 | HR 80 | Temp 98.6°F | Wt 144.0 lb

## 2020-01-08 DIAGNOSIS — I482 Chronic atrial fibrillation, unspecified: Secondary | ICD-10-CM

## 2020-01-08 DIAGNOSIS — Z23 Encounter for immunization: Secondary | ICD-10-CM

## 2020-01-08 LAB — PT WITH INR/FINGERSTICK
INR, fingerstick: 2.4 ratio — ABNORMAL HIGH
PT, fingerstick: 28.8 s — ABNORMAL HIGH (ref 10.5–13.1)

## 2020-01-08 NOTE — Progress Notes (Signed)
Subjective:    Patient ID: Kathy [name], female    DOB: 1927-05-31, 86 y.o.   MRN: [id number] 11/06/19 Patient's INR today is 4.1. She is currently taking 5 mg every day of Coumadin except Monday Wednesday Friday when she takes 2.5. She has been on the same dose for quite some time with no changes. She denies any bleeding or bruising. She denies any medication changes. She denies any antibiotic use. She denies any dietary changes. Of note she has lost weight gradually over the last year. She is down 3 pounds from her last office visit. She denies any chest pain or abdominal pain or shortness of breath or melena or hematochezia or fevers. She believes it is due to her appetite declining. She admits that she is not eating supper. Instead she has breakfast and lunch.  At that time, my plan was: INR is supratherapeutic. Hold Coumadin for 48 hours and then resume Coumadin at 5 mg on Monday Wednesday Friday and 2.5 mg on all other days. Recheck INR in 3 weeks. Add 1 can of Ensure daily as a protein supplement. Then recheck weight at her next office visit. If weight continues to decline will need to institute a work-up for unintentional weight loss however I believe is most likely due to age and decrease caloric intake.  11/27/19 INR today is subtherapeutic at 1.7.  However the patient has gained 6 pounds since I saw her last by drinking Ensure leading me to believe that her weight loss was likely due to protein calorie malnutrition.  At that time, my plan was: INR is subtherapeutic.  Increase Coumadin to 5 mg a day on Monday, Wednesday, Friday, and Saturday.  Take 2.5 mg on Tuesday, Thursday, Sunday.  Recheck INR in 6 weeks.  Weight loss has resolved simply by adding Ensure.  Recommended a protein supplement as needed to prevent weight loss  01/08/20 Wt Readings from Last 3 Encounters:  01/08/20 144 lb (65.3 kg)  11/27/19 147 lb (66.7 kg)  11/06/19 141 lb (64 kg)   Since stopping the Ensure, the  patient has lost 3 pounds.  She is here today to recheck her INR.  She has been taking Coumadin 5 mg 4 days a week.  She takes 2.5 mg 3 days a week.  INR today is therapeutic at 2.4.  She is here today also requesting flu shot.  Current Outpatient Medications on File Prior to Visit  Medication Sig Dispense Refill  . Calcium Carbonate-Vitamin D (CALTRATE 600+D) 600-400 MG-UNIT per tablet Take 1 tablet by mouth 2 (two) times daily.      . cholecalciferol (VITAMIN D) 400 UNITS TABS Take 400 Units by mouth daily. Take in addition to Caltrate with Vitamin D to get 1200 units of Vitamin D    . digoxin (LANOXIN) 0.125 MG tablet TAKE 1 TABLET BY MOUTH ONCE DAILY DO  NOT  TAKE  ON  SATURDAY  ON  SUNDAY 90 tablet 0  . hydrochlorothiazide (HYDRODIURIL) 25 MG tablet Take 1 tablet by mouth once daily 90 tablet 0  . lisinopril (ZESTRIL) 20 MG tablet Take 1 tablet (20 mg total) by mouth daily. 90 tablet 3  . metoprolol tartrate (LOPRESSOR) 50 MG tablet Take 1 tablet by mouth twice daily 180 tablet 0  . oxyCODONE-acetaminophen (PERCOCET) 5-325 MG tablet Take 1 tablet by mouth every 4 (four) hours as needed for severe pain (stop hydrocodone). 30 tablet 0  . simvastatin (ZOCOR) 20 MG tablet TAKE 1  TABLET BY MOUTH AT BEDTIME 90 tablet 1  . vitamin B-12 (CYANOCOBALAMIN) 1000 MCG tablet Take 1,000 mcg by mouth daily.    Marland Kitchen warfarin (COUMADIN) 5 MG tablet Take 2 tablets by mouth once daily (Patient taking differently: MWF 2.5 mg -  5mg  other days) 60 tablet 5   No current facility-administered medications on file prior to visit.    Past Medical History:  Diagnosis Date  . Arthritis    both shoulders  . Atrial fibrillation ([place])   . Breast cancer ([employer])   . Cancer ([place])    left breast  . Cataracts, bilateral   . Diverticulitis   . Diverticulosis   . Hemorrhoids    internal  . Hiatal hernia   . Hyperlipidemia    takes Zocor daily  . Hypertension    takes Amlodipine daily  . Joint pain    both shoulders    . Macular degeneration 02/2003  . Nasal congestion   . Nocturia   . Obesity   . Osteoporosis   . PONV (postoperative nausea and vomiting)   . Prediabetes   . Shortness of breath    with exertion  . Sinus drainage   . Urinary frequency   . Urinary incontinence    Past Surgical History:  Procedure Laterality Date  . bilateral knee replacements  2008  . BREAST BIOPSY  05/04/2011   Procedure: BREAST BIOPSY WITH NEEDLE LOCALIZATION;  Surgeon: Judieth Keens, DO;  Location: [facility];  Service: General;  Laterality: Left;  . CARDIOVERSION N/A 09/15/2012   Procedure: CARDIOVERSION;  Surgeon: Laverda Page, MD;  Location: [facility];  Service: Cardiovascular;  Laterality: N/A;  h&p in file-HW  . COLONOSCOPY    . ESOPHAGOGASTRODUODENOSCOPY    . HEMORRHOID SURGERY     outpatient "done in doctor's office"  . JOINT REPLACEMENT  2008   bilateral knee replacement  . left breast biopsy  1984  . right breast biopsy  1993    No Known Allergies Social History   Socioeconomic History  . Marital status: Widowed    Spouse name: Not on file  . Number of children: Not on file  . Years of education: Not on file  . Highest education level: Not on file  Occupational History  . Occupation: retired  Tobacco Use  . Smoking status: Former Smoker    Packs/day: 0.25    Years: 10.00    Pack years: 2.50    Types: Cigarettes    Quit date: 05/21/1971    Years since quitting: 48.6  . Smokeless tobacco: Never Used  . Tobacco comment: quit in the 60's  Substance and Sexual Activity  . Alcohol use: No  . Drug use: No  . Sexual activity: Not Currently  Other Topics Concern  . Not on file  Social History Narrative  . Not on file   Social Determinants of Health   Financial Resource Strain:   . Difficulty of Paying Living Expenses: Not on file  Food Insecurity:   . Worried About Charity fundraiser in the Last Year: Not on file  . Ran Out of Food in the Last Year: Not on file   Transportation Needs:   . Lack of Transportation (Medical): Not on file  . Lack of Transportation (Non-Medical): Not on file  Physical Activity:   . Days of Exercise per Week: Not on file  . Minutes of Exercise per Session: Not on file  Stress:   . Feeling of Stress : Not  on file  Social Connections:   . Frequency of Communication with Friends and Family: Not on file  . Frequency of Social Gatherings with Friends and Family: Not on file  . Attends Religious Services: Not on file  . Active Member of Clubs or Organizations: Not on file  . Attends Archivist Meetings: Not on file  . Marital Status: Not on file  Intimate Partner Violence:   . Fear of Current or Ex-Partner: Not on file  . Emotionally Abused: Not on file  . Physically Abused: Not on file  . Sexually Abused: Not on file      Review of Systems  All other systems reviewed and are negative.      Objective:   Physical Exam Vitals reviewed.  Constitutional:      General: She is not in acute distress.    Appearance: She is well-developed.  Cardiovascular:     Rate and Rhythm: Normal rate. Rhythm irregular.     Heart sounds: Normal heart sounds.  Pulmonary:     Effort: Pulmonary effort is normal. No respiratory distress.     Breath sounds: Normal breath sounds. No wheezing or rales.  Abdominal:     General: Bowel sounds are normal.     Palpations: Abdomen is soft.  Neurological:     Mental Status: She is alert.     Motor: No abnormal muscle tone.     Deep Tendon Reflexes: Reflexes are normal and symmetric.        Assessment & Plan:    Chronic atrial fibrillation ([place]) - Plan: PT with INR/Fingerstick  INR is therapeutic.  Recheck Coumadin/INR in 6 weeks.  Patient received her flu shot today.

## 2020-01-14 ENCOUNTER — Other Ambulatory Visit: Payer: Self-pay | Admitting: Family Medicine

## 2020-01-28 ENCOUNTER — Other Ambulatory Visit: Payer: Self-pay | Admitting: Family Medicine

## 2020-02-04 ENCOUNTER — Other Ambulatory Visit: Payer: Self-pay | Admitting: Family Medicine

## 2020-02-19 ENCOUNTER — Other Ambulatory Visit: Payer: Self-pay

## 2020-02-19 ENCOUNTER — Ambulatory Visit (INDEPENDENT_AMBULATORY_CARE_PROVIDER_SITE_OTHER): Payer: Medicare Other | Admitting: Family Medicine

## 2020-02-19 VITALS — BP 120/80 | HR 68 | Temp 98.0°F | Ht 59.0 in | Wt 145.0 lb

## 2020-02-19 DIAGNOSIS — I482 Chronic atrial fibrillation, unspecified: Secondary | ICD-10-CM | POA: Diagnosis not present

## 2020-02-19 LAB — PT WITH INR/FINGERSTICK
INR, fingerstick: 2.5 ratio — ABNORMAL HIGH
PT, fingerstick: 30.1 s — ABNORMAL HIGH (ref 10.5–13.1)

## 2020-02-19 NOTE — Progress Notes (Signed)
Subjective:    Patient ID: Kathy [name], female    DOB: October 19, 1927, 86 y.o.   MRN: [id number]  She is here today to recheck her INR.  She has been taking Coumadin 5 mg 4 days a week.  She takes 2.5 mg 3 days a week.  INR today is therapeutic at 2.5. Current Outpatient Medications on File Prior to Visit  Medication Sig Dispense Refill  . Calcium Carbonate-Vitamin D (CALTRATE 600+D) 600-400 MG-UNIT per tablet Take 1 tablet by mouth 2 (two) times daily.      . cholecalciferol (VITAMIN D) 400 UNITS TABS Take 400 Units by mouth daily. Take in addition to Caltrate with Vitamin D to get 1200 units of Vitamin D    . digoxin (LANOXIN) 0.125 MG tablet TAKE 1 TABLET BY MOUTH ONCE DAILY DO  NOT  TAKE  ON  SATURDAY  ON  SUNDAY 90 tablet 0  . hydrochlorothiazide (HYDRODIURIL) 25 MG tablet Take 1 tablet by mouth once daily 90 tablet 0  . lisinopril (ZESTRIL) 20 MG tablet Take 1 tablet (20 mg total) by mouth daily. 90 tablet 3  . metoprolol tartrate (LOPRESSOR) 50 MG tablet Take 1 tablet by mouth twice daily 180 tablet 0  . oxyCODONE-acetaminophen (PERCOCET) 5-325 MG tablet Take 1 tablet by mouth every 4 (four) hours as needed for severe pain (stop hydrocodone). 30 tablet 0  . simvastatin (ZOCOR) 20 MG tablet TAKE 1 TABLET BY MOUTH AT BEDTIME 90 tablet 0  . vitamin B-12 (CYANOCOBALAMIN) 1000 MCG tablet Take 1,000 mcg by mouth daily.    Marland Kitchen warfarin (COUMADIN) 5 MG tablet MWF 2.5 mg -  5mg  other days 60 tablet 0   No current facility-administered medications on file prior to visit.    Past Medical History:  Diagnosis Date  . Arthritis    both shoulders  . Atrial fibrillation ([place])   . Breast cancer ([place])   . Cancer ([place])    left breast  . Cataracts, bilateral   . Diverticulitis   . Diverticulosis   . Hemorrhoids    internal  . Hiatal hernia   . Hyperlipidemia    takes Zocor daily  . Hypertension    takes Amlodipine daily  . Joint pain    both shoulders  . Macular degeneration  02/2003  . Nasal congestion   . Nocturia   . Obesity   . Osteoporosis   . PONV (postoperative nausea and vomiting)   . Prediabetes   . Shortness of breath    with exertion  . Sinus drainage   . Urinary frequency   . Urinary incontinence    Past Surgical History:  Procedure Laterality Date  . bilateral knee replacements  2008  . BREAST BIOPSY  05/04/2011   Procedure: BREAST BIOPSY WITH NEEDLE LOCALIZATION;  Surgeon: Judieth Keens, DO;  Location: [facility];  Service: General;  Laterality: Left;  . CARDIOVERSION N/A 09/15/2012   Procedure: CARDIOVERSION;  Surgeon: Laverda Page, MD;  Location: [facility];  Service: Cardiovascular;  Laterality: N/A;  h&p in file-HW  . COLONOSCOPY    . ESOPHAGOGASTRODUODENOSCOPY    . HEMORRHOID SURGERY     outpatient "done in doctor's office"  . JOINT REPLACEMENT  2008   bilateral knee replacement  . left breast biopsy  1984  . right breast biopsy  1993    No Known Allergies Social History   Socioeconomic History  . Marital status: Widowed    Spouse name: Not on file  .  Number of children: Not on file  . Years of education: Not on file  . Highest education level: Not on file  Occupational History  . Occupation: retired  Tobacco Use  . Smoking status: Former Smoker    Packs/day: 0.25    Years: 10.00    Pack years: 2.50    Types: Cigarettes    Quit date: 05/21/1971    Years since quitting: 48.7  . Smokeless tobacco: Never Used  . Tobacco comment: quit in the 60's  Substance and Sexual Activity  . Alcohol use: No  . Drug use: No  . Sexual activity: Not Currently  Other Topics Concern  . Not on file  Social History Narrative  . Not on file   Social Determinants of Health   Financial Resource Strain:   . Difficulty of Paying Living Expenses: Not on file  Food Insecurity:   . Worried About Charity fundraiser in the Last Year: Not on file  . Ran Out of Food in the Last Year: Not on file  Transportation Needs:   . Lack of  Transportation (Medical): Not on file  . Lack of Transportation (Non-Medical): Not on file  Physical Activity:   . Days of Exercise per Week: Not on file  . Minutes of Exercise per Session: Not on file  Stress:   . Feeling of Stress : Not on file  Social Connections:   . Frequency of Communication with Friends and Family: Not on file  . Frequency of Social Gatherings with Friends and Family: Not on file  . Attends Religious Services: Not on file  . Active Member of Clubs or Organizations: Not on file  . Attends Archivist Meetings: Not on file  . Marital Status: Not on file  Intimate Partner Violence:   . Fear of Current or Ex-Partner: Not on file  . Emotionally Abused: Not on file  . Physically Abused: Not on file  . Sexually Abused: Not on file      Review of Systems  All other systems reviewed and are negative.      Objective:   Physical Exam Vitals reviewed.  Constitutional:      General: She is not in acute distress.    Appearance: She is well-developed.  Cardiovascular:     Rate and Rhythm: Normal rate. Rhythm irregular.     Heart sounds: Normal heart sounds.  Pulmonary:     Effort: Pulmonary effort is normal. No respiratory distress.     Breath sounds: Normal breath sounds. No wheezing or rales.  Abdominal:     General: Bowel sounds are normal.     Palpations: Abdomen is soft.  Neurological:     Mental Status: She is alert.     Motor: No abnormal muscle tone.     Deep Tendon Reflexes: Reflexes are normal and symmetric.        Assessment & Plan:    Chronic atrial fibrillation ([place]) - Plan: PT with INR/Fingerstick   INR is therapeutic.  Recheck Coumadin/INR in 6 weeks.

## 2020-02-21 DIAGNOSIS — Z1231 Encounter for screening mammogram for malignant neoplasm of breast: Secondary | ICD-10-CM | POA: Diagnosis not present

## 2020-02-22 DIAGNOSIS — Z23 Encounter for immunization: Secondary | ICD-10-CM | POA: Diagnosis not present

## 2020-02-25 ENCOUNTER — Other Ambulatory Visit: Payer: Self-pay | Admitting: Family Medicine

## 2020-03-17 ENCOUNTER — Other Ambulatory Visit: Payer: Self-pay | Admitting: Family Medicine

## 2020-03-18 ENCOUNTER — Telehealth: Payer: Self-pay

## 2020-03-18 NOTE — Telephone Encounter (Signed)
  Please call dentist back, we need a letter or statement/form regarding why she needs to stop coumadin If she is only having 1 tooth removed, recommendations state she does not Need to stop coumadin She has A fib  Please put dentist name and phone number in the telephone note

## 2020-03-18 NOTE — Telephone Encounter (Signed)
Pt is going for tooth extraction on March 28, 2020  Dentist  Office called and wanted to know how long Kathy [name] should remain off her coumadin after having her procedure?

## 2020-03-19 ENCOUNTER — Telehealth: Payer: Self-pay

## 2020-03-19 NOTE — Telephone Encounter (Signed)
Dr. Lesia Sago (Dentist has mutual patient asking if she should stop coumadin, dentist office is going to fax letter over stated Kathy [name] only needing 1 tooth extracted.     Kathy [name] [phone] [phone]

## 2020-03-19 NOTE — Telephone Encounter (Signed)
Note is in telephone not, letter being faxed over stating only 1 tooth being extracted.

## 2020-03-25 NOTE — Telephone Encounter (Signed)
Can hold coumadin 5 days before procedure, and resume afterwards

## 2020-03-31 ENCOUNTER — Other Ambulatory Visit: Payer: Self-pay | Admitting: Family Medicine

## 2020-04-01 ENCOUNTER — Other Ambulatory Visit: Payer: Self-pay

## 2020-04-01 ENCOUNTER — Ambulatory Visit (INDEPENDENT_AMBULATORY_CARE_PROVIDER_SITE_OTHER): Payer: Medicare Other | Admitting: Family Medicine

## 2020-04-01 VITALS — BP 134/78 | HR 67 | Temp 97.8°F | Resp 15 | Ht 59.0 in | Wt 145.0 lb

## 2020-04-01 DIAGNOSIS — I482 Chronic atrial fibrillation, unspecified: Secondary | ICD-10-CM

## 2020-04-01 LAB — PT WITH INR/FINGERSTICK
INR, fingerstick: 2.8 ratio — ABNORMAL HIGH
PT, fingerstick: 33.1 s — ABNORMAL HIGH (ref 10.5–13.1)

## 2020-04-01 MED ORDER — DIGOXIN 125 MCG PO TABS
ORAL_TABLET | ORAL | 3 refills | Status: DC
Start: 2020-04-01 — End: 2021-01-06

## 2020-04-01 MED ORDER — METOPROLOL TARTRATE 50 MG PO TABS
50.0000 mg | ORAL_TABLET | Freq: Two times a day (BID) | ORAL | 3 refills | Status: DC
Start: 2020-04-01 — End: 2021-05-20

## 2020-04-01 MED ORDER — LISINOPRIL 20 MG PO TABS
20.0000 mg | ORAL_TABLET | Freq: Every day | ORAL | 3 refills | Status: DC
Start: 2020-04-01 — End: 2021-05-05

## 2020-04-01 MED ORDER — SIMVASTATIN 20 MG PO TABS
20.0000 mg | ORAL_TABLET | Freq: Every day | ORAL | 3 refills | Status: DC
Start: 2020-04-01 — End: 2020-05-09

## 2020-04-01 MED ORDER — HYDROCHLOROTHIAZIDE 25 MG PO TABS
25.0000 mg | ORAL_TABLET | Freq: Every day | ORAL | 3 refills | Status: DC
Start: 2020-04-01 — End: 2020-05-09
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
## 2020-04-02 LAB — COMPLETE METABOLIC PANEL WITH GFR
AG Ratio: 2 (calc) (ref 1.0–2.5)
ALT: 8 U/L (ref 6–29)
AST: 11 U/L (ref 10–35)
Albumin: 4.1 g/dL (ref 3.6–5.1)
Alkaline phosphatase (APISO): 67 U/L (ref 37–153)
BUN/Creatinine Ratio: 29 (calc) — ABNORMAL HIGH (ref 6–22)
BUN: 34 mg/dL — ABNORMAL HIGH (ref 7–25)
CO2: 27 mmol/L (ref 20–32)
Calcium: 9.5 mg/dL (ref 8.6–10.4)
Chloride: 104 mmol/L (ref 98–110)
Creat: 1.16 mg/dL — ABNORMAL HIGH (ref 0.60–0.88)
GFR, Est African American: 48 mL/min/{1.73_m2} — ABNORMAL LOW (ref >=60)
GFR, Est Non African American: 42 mL/min/{1.73_m2} — ABNORMAL LOW (ref >=60)
Globulin: 2.1 g/dL (calc) (ref 1.9–3.7)
Glucose, Bld: 93 mg/dL (ref 65–99)
Potassium: 4.2 mmol/L (ref 3.5–5.3)
Sodium: 141 mmol/L (ref 135–146)
Total Bilirubin: 0.6 mg/dL (ref 0.2–1.2)
Total Protein: 6.2 g/dL (ref 6.1–8.1)

## 2020-04-02 LAB — CBC WITH DIFFERENTIAL/PLATELET
Absolute Monocytes: 641 cells/uL (ref 200–950)
Basophils Absolute: 43 cells/uL (ref 0–200)
Basophils Relative: 0.6 %
Eosinophils Absolute: 137 cells/uL (ref 15–500)
Eosinophils Relative: 1.9 %
HCT: 37.5 % (ref 35.0–45.0)
Hemoglobin: 12.4 g/dL (ref 11.7–15.5)
Lymphs Abs: 3218 cells/uL (ref 850–3900)
MCH: 32.9 pg (ref 27.0–33.0)
MCHC: 33.1 g/dL (ref 32.0–36.0)
MCV: 99.5 fL (ref 80.0–100.0)
MPV: 10.5 fL (ref 7.5–12.5)
Monocytes Relative: 8.9 %
Neutro Abs: 3161 cells/uL (ref 1500–7800)
Neutrophils Relative %: 43.9 %
Platelets: 205 10*3/uL (ref 140–400)
RBC: 3.77 10*6/uL — ABNORMAL LOW (ref 3.80–5.10)
RDW: 12.9 % (ref 11.0–15.0)
Total Lymphocyte: 44.7 %
WBC: 7.2 10*3/uL (ref 3.8–10.8)

## 2020-04-03 NOTE — Telephone Encounter (Signed)
Patient notified and Dentist office as well

## 2020-05-09 ENCOUNTER — Other Ambulatory Visit: Payer: Self-pay | Admitting: Family Medicine

## 2020-05-13 ENCOUNTER — Ambulatory Visit: Payer: Medicare Other | Admitting: Family Medicine

## 2020-05-21 ENCOUNTER — Ambulatory Visit (INDEPENDENT_AMBULATORY_CARE_PROVIDER_SITE_OTHER): Payer: Medicare Other | Admitting: Family Medicine

## 2020-05-21 ENCOUNTER — Other Ambulatory Visit: Payer: Self-pay

## 2020-05-21 VITALS — BP 120/60 | HR 79 | Temp 98.0°F | Wt 150.0 lb

## 2020-05-21 DIAGNOSIS — I482 Chronic atrial fibrillation, unspecified: Secondary | ICD-10-CM | POA: Diagnosis not present

## 2020-05-21 LAB — PT WITH INR/FINGERSTICK
INR, fingerstick: 2.3 ratio — ABNORMAL HIGH
PT, fingerstick: 27.6 s — ABNORMAL HIGH (ref 10.5–13.1)

## 2020-05-21 MED ORDER — FLUTICASONE PROPIONATE 50 MCG/ACT NA SUSP: 2.0000 | Freq: Every day | NASAL | 6 refills | Status: DC

## 2020-05-21 NOTE — Progress Notes (Signed)
Subjective:    Patient ID: Kathy [name], female    DOB: 1928-04-18, 87 y.o.   MRN: [id number]  She is here today to recheck her INR.  She has been taking Coumadin 5 mg 4 days a week (MWFSUN).  She takes 2.5 mg 3 days a week (TRSAT).  INR today is therapeutic at 2.3.  She denies any bleeding or bruising.  She denies any syncope or TIA-like symptoms.  Otherwise she is doing well with no concerns. Current Outpatient Medications on File Prior to Visit  Medication Sig Dispense Refill  . Calcium Carbonate-Vitamin D (CALTRATE 600+D) 600-400 MG-UNIT per tablet Take 1 tablet by mouth 2 (two) times daily.      . cholecalciferol (VITAMIN D) 400 UNITS TABS Take 400 Units by mouth daily. Take in addition to Caltrate with Vitamin D to get 1200 units of Vitamin D    . digoxin (LANOXIN) 0.125 MG tablet TAKE 1 TABLET BY MOUTH ONCE DAILY DO  NOT  TAKE  ON  SATURDAY  ON  SUNDAY 90 tablet 3  . hydrochlorothiazide (HYDRODIURIL) 25 MG tablet Take 1 tablet by mouth once daily 90 tablet 0  . lisinopril (ZESTRIL) 20 MG tablet Take 1 tablet (20 mg total) by mouth daily. 90 tablet 3  . metoprolol tartrate (LOPRESSOR) 50 MG tablet Take 1 tablet (50 mg total) by mouth 2 (two) times daily. 180 tablet 3  . oxyCODONE-acetaminophen (PERCOCET) 5-325 MG tablet Take 1 tablet by mouth every 4 (four) hours as needed for severe pain (stop hydrocodone). 30 tablet 0  . simvastatin (ZOCOR) 20 MG tablet TAKE 1 TABLET BY MOUTH AT BEDTIME 90 tablet 0  . vitamin B-12 (CYANOCOBALAMIN) 1000 MCG tablet Take 1,000 mcg by mouth daily.    Marland Kitchen warfarin (COUMADIN) 5 MG tablet TAKE 1/2 (ONE-HALF) TABLET BY MOUTH ON MONDAY, WEDNESDAY AND FRIDAY AND  1  TABLET  OTHER  DAYS 60 tablet 0   No current facility-administered medications on file prior to visit.    Past Medical History:  Diagnosis Date  . Arthritis    both shoulders  . Atrial fibrillation ([place])   . Breast cancer ([place])   . Cancer ([place])    left breast  . Cataracts, bilateral    . Diverticulitis   . Diverticulosis   . Hemorrhoids    internal  . Hiatal hernia   . Hyperlipidemia    takes Zocor daily  . Hypertension    takes Amlodipine daily  . Joint pain    both shoulders  . Macular degeneration 02/2003  . Nasal congestion   . Nocturia   . Obesity   . Osteoporosis   . PONV (postoperative nausea and vomiting)   . Prediabetes   . Shortness of breath    with exertion  . Sinus drainage   . Urinary frequency   . Urinary incontinence    Past Surgical History:  Procedure Laterality Date  . bilateral knee replacements  2008  . BREAST BIOPSY  05/04/2011   Procedure: BREAST BIOPSY WITH NEEDLE LOCALIZATION;  Surgeon: Judieth Keens, DO;  Location: [facility];  Service: General;  Laterality: Left;  . CARDIOVERSION N/A 09/15/2012   Procedure: CARDIOVERSION;  Surgeon: Laverda Page, MD;  Location: [facility];  Service: Cardiovascular;  Laterality: N/A;  h&p in file-HW  . COLONOSCOPY    . ESOPHAGOGASTRODUODENOSCOPY    . HEMORRHOID SURGERY     outpatient "done in doctor's office"  . JOINT REPLACEMENT  2008  bilateral knee replacement  . left breast biopsy  1984  . right breast biopsy  1993    No Known Allergies Social History   Socioeconomic History  . Marital status: Widowed    Spouse name: Not on file  . Number of children: Not on file  . Years of education: Not on file  . Highest education level: Not on file  Occupational History  . Occupation: retired  Tobacco Use  . Smoking status: Former Smoker    Packs/day: 0.25    Years: 10.00    Pack years: 2.50    Types: Cigarettes    Quit date: 05/21/1971    Years since quitting: 49.0  . Smokeless tobacco: Never Used  . Tobacco comment: quit in the 60's  Substance and Sexual Activity  . Alcohol use: No  . Drug use: No  . Sexual activity: Not Currently  Other Topics Concern  . Not on file  Social History Narrative  . Not on file   Social Determinants of Health   Financial Resource Strain:  Not on file  Food Insecurity: Not on file  Transportation Needs: Not on file  Physical Activity: Not on file  Stress: Not on file  Social Connections: Not on file  Intimate Partner Violence: Not on file      Review of Systems  All other systems reviewed and are negative.      Objective:   Physical Exam Vitals reviewed.  Constitutional:      General: She is not in acute distress.    Appearance: She is well-developed.  Cardiovascular:     Rate and Rhythm: Normal rate. Rhythm irregular.     Heart sounds: Normal heart sounds.  Pulmonary:     Effort: Pulmonary effort is normal. No respiratory distress.     Breath sounds: Normal breath sounds. No wheezing or rales.  Abdominal:     General: Bowel sounds are normal.     Palpations: Abdomen is soft.  Neurological:     Mental Status: She is alert.     Motor: No abnormal muscle tone.     Deep Tendon Reflexes: Reflexes are normal and symmetric.        Assessment & Plan:  Chronic atrial fibrillation ([place]) - Plan: PT with INR/Fingerstick     Heart rate is well controlled although her rhythm is irregular.  INR is therapeutic.  Recheck Coumadin/INR in 6 weeks.  While the patient is here, I will also check a CBC and a CMP to monitor for any anemia, kidney dysfunction, or liver dysfunction.

## 2020-06-30 ENCOUNTER — Other Ambulatory Visit: Payer: Self-pay | Admitting: Family Medicine

## 2020-07-02 ENCOUNTER — Encounter: Payer: Self-pay | Admitting: Family Medicine

## 2020-07-02 ENCOUNTER — Ambulatory Visit (INDEPENDENT_AMBULATORY_CARE_PROVIDER_SITE_OTHER): Payer: Medicare Other | Admitting: Family Medicine

## 2020-07-02 ENCOUNTER — Other Ambulatory Visit: Payer: Self-pay

## 2020-07-02 VITALS — BP 134/80 | HR 63 | Temp 98.6°F | Resp 18 | Wt 145.0 lb

## 2020-07-02 DIAGNOSIS — I482 Chronic atrial fibrillation, unspecified: Secondary | ICD-10-CM

## 2020-07-02 LAB — PT WITH INR/FINGERSTICK
INR, fingerstick: 2.5 ratio — ABNORMAL HIGH
PT, fingerstick: 30.4 s — ABNORMAL HIGH (ref 10.5–13.1)

## 2020-07-02 NOTE — Progress Notes (Signed)
Subjective:    Patient ID: Kathy [name], female    DOB: [birth year], 87 y.o.   MRN: [id number]  She is here today to recheck her INR.  She has been taking Coumadin 5 mg 4 days a week (MWFSUN).  She takes 2.5 mg 3 days a week (TRSAT).  INR today is therapeutic at 2.5.  She denies any bleeding or bruising.  She denies any syncope or TIA-like symptoms.  Otherwise she is doing well with no concerns. Current Outpatient Medications on File Prior to Visit  Medication Sig Dispense Refill  . Calcium Carbonate-Vitamin D (CALTRATE 600+D) 600-400 MG-UNIT per tablet Take 1 tablet by mouth 2 (two) times daily.      . cholecalciferol (VITAMIN D) 400 UNITS TABS Take 400 Units by mouth daily. Take in addition to Caltrate with Vitamin D to get 1200 units of Vitamin D    . digoxin (LANOXIN) 0.125 MG tablet TAKE 1 TABLET BY MOUTH ONCE DAILY DO  NOT  TAKE  ON  SATURDAY  ON  SUNDAY 90 tablet 3  . fluticasone (FLONASE) 50 MCG/ACT nasal spray Place 2 sprays into both nostrils daily. 16 g 6  . hydrochlorothiazide (HYDRODIURIL) 25 MG tablet Take 1 tablet by mouth once daily 90 tablet 0  . lisinopril (ZESTRIL) 20 MG tablet Take 1 tablet (20 mg total) by mouth daily. 90 tablet 3  . metoprolol tartrate (LOPRESSOR) 50 MG tablet Take 1 tablet (50 mg total) by mouth 2 (two) times daily. 180 tablet 3  . oxyCODONE-acetaminophen (PERCOCET) 5-325 MG tablet Take 1 tablet by mouth every 4 (four) hours as needed for severe pain (stop hydrocodone). 30 tablet 0  . simvastatin (ZOCOR) 20 MG tablet TAKE 1 TABLET BY MOUTH AT BEDTIME 90 tablet 0  . vitamin B-12 (CYANOCOBALAMIN) 1000 MCG tablet Take 1,000 mcg by mouth daily.    Marland Kitchen warfarin (COUMADIN) 5 MG tablet TAKE 1/2 (ONE-HALF) TABLET BY MOUTH ON MONDAY, WEDNESDAY AND FRIDAY AND 1 TAB BY MOUTH OTHER  DAYS 60 tablet 0   No current facility-administered medications on file prior to visit.    Past Medical History:  Diagnosis Date  . Arthritis    both shoulders  . Atrial  fibrillation ([place])   . Breast cancer ([place])   . Cancer ([place])    left breast  . Cataracts, bilateral   . Diverticulitis   . Diverticulosis   . Hemorrhoids    internal  . Hiatal hernia   . Hyperlipidemia    takes Zocor daily  . Hypertension    takes Amlodipine daily  . Joint pain    both shoulders  . Macular degeneration 02/2003  . Nasal congestion   . Nocturia   . Obesity   . Osteoporosis   . PONV (postoperative nausea and vomiting)   . Prediabetes   . Shortness of breath    with exertion  . Sinus drainage   . Urinary frequency   . Urinary incontinence    Past Surgical History:  Procedure Laterality Date  . bilateral knee replacements  2008  . BREAST BIOPSY  05/04/2011   Procedure: BREAST BIOPSY WITH NEEDLE LOCALIZATION;  Surgeon: Judieth Keens, DO;  Location: [facility];  Service: General;  Laterality: Left;  . CARDIOVERSION N/A 09/15/2012   Procedure: CARDIOVERSION;  Surgeon: Laverda Page, MD;  Location: [facility];  Service: Cardiovascular;  Laterality: N/A;  h&p in file-HW  . COLONOSCOPY    . ESOPHAGOGASTRODUODENOSCOPY    . HEMORRHOID  SURGERY     outpatient "done in doctor's office"  . JOINT REPLACEMENT  2008   bilateral knee replacement  . left breast biopsy  1984  . right breast biopsy  1993    No Known Allergies Social History   Socioeconomic History  . Marital status: Widowed    Spouse name: Not on file  . Number of children: Not on file  . Years of education: Not on file  . Highest education level: Not on file  Occupational History  . Occupation: retired  Tobacco Use  . Smoking status: Former Smoker    Packs/day: 0.25    Years: 10.00    Pack years: 2.50    Types: Cigarettes    Quit date: 05/21/1971    Years since quitting: 49.1  . Smokeless tobacco: Never Used  . Tobacco comment: quit in the 60's  Substance and Sexual Activity  . Alcohol use: No  . Drug use: No  . Sexual activity: Not Currently  Other Topics Concern  . Not on file   Social History Narrative  . Not on file   Social Determinants of Health   Financial Resource Strain: Not on file  Food Insecurity: Not on file  Transportation Needs: Not on file  Physical Activity: Not on file  Stress: Not on file  Social Connections: Not on file  Intimate Partner Violence: Not on file      Review of Systems  All other systems reviewed and are negative.      Objective:   Physical Exam Vitals reviewed.  Constitutional:      General: She is not in acute distress.    Appearance: She is well-developed.  Cardiovascular:     Rate and Rhythm: Normal rate. Rhythm irregular.     Heart sounds: Normal heart sounds.  Pulmonary:     Effort: Pulmonary effort is normal. No respiratory distress.     Breath sounds: Normal breath sounds. No wheezing or rales.  Abdominal:     General: Bowel sounds are normal.     Palpations: Abdomen is soft.  Neurological:     Mental Status: She is alert.     Motor: No abnormal muscle tone.     Deep Tendon Reflexes: Reflexes are normal and symmetric.        Assessment & Plan:  Chronic atrial fibrillation ([place]) - Plan: PT with INR/Fingerstick     Heart rate is well controlled although her rhythm is irregular.  INR is therapeutic.  Recheck Coumadin/INR in 6 weeks.

## 2020-08-12 ENCOUNTER — Other Ambulatory Visit: Payer: Self-pay

## 2020-08-12 ENCOUNTER — Ambulatory Visit (INDEPENDENT_AMBULATORY_CARE_PROVIDER_SITE_OTHER): Payer: Medicare Other | Admitting: Family Medicine

## 2020-08-12 DIAGNOSIS — I482 Chronic atrial fibrillation, unspecified: Secondary | ICD-10-CM | POA: Diagnosis not present

## 2020-08-12 LAB — PT WITH INR/FINGERSTICK
INR, fingerstick: 2.3 ratio — ABNORMAL HIGH
PT, fingerstick: 27.9 s — ABNORMAL HIGH (ref 10.5–13.1)

## 2020-08-12 NOTE — Progress Notes (Signed)
Subjective:    Patient ID: Kathy [name], female    DOB: 04-18-1928, 87 y.o.   MRN: [id number]  She is here today to recheck her INR.  She has been taking Coumadin 5 mg 4 days a week (MWFSUN).  She takes 2.5 mg 3 days a week (TRSAT).  INR today is therapeutic at 2.3.  She denies any bleeding or bruising.  She denies any syncope or TIA-like symptoms.  Otherwise she is doing well with no concerns.  Current Outpatient Medications on File Prior to Visit  Medication Sig Dispense Refill  . Calcium Carbonate-Vitamin D (CALTRATE 600+D) 600-400 MG-UNIT per tablet Take 1 tablet by mouth 2 (two) times daily.      . cholecalciferol (VITAMIN D) 400 UNITS TABS Take 400 Units by mouth daily. Take in addition to Caltrate with Vitamin D to get 1200 units of Vitamin D    . digoxin (LANOXIN) 0.125 MG tablet TAKE 1 TABLET BY MOUTH ONCE DAILY DO  NOT  TAKE  ON  SATURDAY  ON  SUNDAY 90 tablet 3  . fluticasone (FLONASE) 50 MCG/ACT nasal spray Place 2 sprays into both nostrils daily. 16 g 6  . hydrochlorothiazide (HYDRODIURIL) 25 MG tablet Take 1 tablet by mouth once daily 90 tablet 0  . lisinopril (ZESTRIL) 20 MG tablet Take 1 tablet (20 mg total) by mouth daily. 90 tablet 3  . metoprolol tartrate (LOPRESSOR) 50 MG tablet Take 1 tablet (50 mg total) by mouth 2 (two) times daily. 180 tablet 3  . oxyCODONE-acetaminophen (PERCOCET) 5-325 MG tablet Take 1 tablet by mouth every 4 (four) hours as needed for severe pain (stop hydrocodone). 30 tablet 0  . simvastatin (ZOCOR) 20 MG tablet TAKE 1 TABLET BY MOUTH AT BEDTIME 90 tablet 0  . vitamin B-12 (CYANOCOBALAMIN) 1000 MCG tablet Take 1,000 mcg by mouth daily.    Marland Kitchen warfarin (COUMADIN) 5 MG tablet TAKE 1/2 (ONE-HALF) TABLET BY MOUTH ON MONDAY, WEDNESDAY AND FRIDAY AND 1 TAB BY MOUTH OTHER  DAYS 60 tablet 0   No current facility-administered medications on file prior to visit.    Past Medical History:  Diagnosis Date  . Arthritis    both shoulders  . Atrial  fibrillation ([place])   . Breast cancer ([place])   . Cancer ([place])    left breast  . Cataracts, bilateral   . Diverticulitis   . Diverticulosis   . Hemorrhoids    internal  . Hiatal hernia   . Hyperlipidemia    takes Zocor daily  . Hypertension    takes Amlodipine daily  . Joint pain    both shoulders  . Macular degeneration 02/2003  . Nasal congestion   . Nocturia   . Obesity   . Osteoporosis   . PONV (postoperative nausea and vomiting)   . Prediabetes   . Shortness of breath    with exertion  . Sinus drainage   . Urinary frequency   . Urinary incontinence    Past Surgical History:  Procedure Laterality Date  . bilateral knee replacements  2008  . BREAST BIOPSY  05/04/2011   Procedure: BREAST BIOPSY WITH NEEDLE LOCALIZATION;  Surgeon: Judieth Keens, DO;  Location: [facility];  Service: General;  Laterality: Left;  . CARDIOVERSION N/A 09/15/2012   Procedure: CARDIOVERSION;  Surgeon: Laverda Page, MD;  Location: [facility];  Service: Cardiovascular;  Laterality: N/A;  h&p in file-HW  . COLONOSCOPY    . ESOPHAGOGASTRODUODENOSCOPY    .  HEMORRHOID SURGERY     outpatient "done in doctor's office"  . JOINT REPLACEMENT  2008   bilateral knee replacement  . left breast biopsy  1984  . right breast biopsy  1993    No Known Allergies Social History   Socioeconomic History  . Marital status: Widowed    Spouse name: Not on file  . Number of children: Not on file  . Years of education: Not on file  . Highest education level: Not on file  Occupational History  . Occupation: retired  Tobacco Use  . Smoking status: Former Smoker    Packs/day: 0.25    Years: 10.00    Pack years: 2.50    Types: Cigarettes    Quit date: 05/21/1971    Years since quitting: 49.2  . Smokeless tobacco: Never Used  . Tobacco comment: quit in the 60's  Substance and Sexual Activity  . Alcohol use: No  . Drug use: No  . Sexual activity: Not Currently  Other Topics Concern  . Not on file   Social History Narrative  . Not on file   Social Determinants of Health   Financial Resource Strain: Not on file  Food Insecurity: Not on file  Transportation Needs: Not on file  Physical Activity: Not on file  Stress: Not on file  Social Connections: Not on file  Intimate Partner Violence: Not on file      Review of Systems  All other systems reviewed and are negative.      Objective:   Physical Exam Vitals reviewed.  Constitutional:      General: She is not in acute distress.    Appearance: She is well-developed.  Cardiovascular:     Rate and Rhythm: Normal rate. Rhythm irregular.     Heart sounds: Normal heart sounds.  Pulmonary:     Effort: Pulmonary effort is normal. No respiratory distress.     Breath sounds: Normal breath sounds. No wheezing or rales.  Abdominal:     General: Bowel sounds are normal.     Palpations: Abdomen is soft.  Neurological:     Mental Status: She is alert.     Motor: No abnormal muscle tone.     Deep Tendon Reflexes: Reflexes are normal and symmetric.        Assessment & Plan:  Chronic atrial fibrillation ([place]) - Plan: PT with INR/Fingerstick  INR is therapeutic.  Heart rate is controlled.  Patient denies any dizziness or chest pain or syncope or palpitations or shortness of breath.  Recheck INR in 6 weeks.  Recommended that she get her fourth COVID-vaccine.

## 2020-08-30 DIAGNOSIS — Z23 Encounter for immunization: Secondary | ICD-10-CM | POA: Diagnosis not present

## 2020-09-09 ENCOUNTER — Other Ambulatory Visit: Payer: Self-pay | Admitting: Family Medicine

## 2020-09-26 ENCOUNTER — Ambulatory Visit: Payer: Medicare Other | Admitting: Family Medicine

## 2020-09-26 ENCOUNTER — Telehealth: Payer: Self-pay | Admitting: Family Medicine

## 2020-09-26 NOTE — Chronic Care Management (AMB) (Signed)
  Chronic Care Management   Outreach Note  09/26/2020 Name: Kathy [name] MRN: [id number] DOB: Nov 10, 1927  Referred by: Susy Frizzle, MD Reason for referral : No chief complaint on file.   An unsuccessful telephone outreach was attempted today. The patient was referred to the pharmacist for assistance with care management and care coordination.   Follow Up Plan:   Kathy [name] Upstream Scheduler

## 2020-10-07 ENCOUNTER — Ambulatory Visit (INDEPENDENT_AMBULATORY_CARE_PROVIDER_SITE_OTHER): Payer: Medicare Other | Admitting: Family Medicine

## 2020-10-07 ENCOUNTER — Other Ambulatory Visit: Payer: Self-pay

## 2020-10-07 ENCOUNTER — Encounter: Payer: Self-pay | Admitting: Family Medicine

## 2020-10-07 DIAGNOSIS — I482 Chronic atrial fibrillation, unspecified: Secondary | ICD-10-CM

## 2020-10-07 LAB — PT WITH INR/FINGERSTICK
INR, fingerstick: 2.5 ratio — ABNORMAL HIGH
PT, fingerstick: 30.5 s — ABNORMAL HIGH (ref 10.5–13.1)

## 2020-10-07 NOTE — Progress Notes (Signed)
Subjective:    Patient ID: Kathy [name], female    DOB: 1928-02-24, 87 y.o.   MRN: [id number]  She is here today to recheck her INR.  She has been taking Coumadin 5 mg 4 days a week (MWFSat).  She takes 2.5 mg 3 days a week (TRSun).  INR today is therapeutic at 2.5.    She denies any bleeding or bruising.  She denies any syncope or TIA-like symptoms.  Otherwise she is doing well with no concerns.  Current Outpatient Medications on File Prior to Visit  Medication Sig Dispense Refill   Calcium Carbonate-Vitamin D 600-400 MG-UNIT tablet Take 1 tablet by mouth 2 (two) times daily.     cholecalciferol (VITAMIN D) 400 UNITS TABS Take 400 Units by mouth daily. Take in addition to Caltrate with Vitamin D to get 1200 units of Vitamin D     digoxin (LANOXIN) 0.125 MG tablet TAKE 1 TABLET BY MOUTH ONCE DAILY DO  NOT  TAKE  ON  SATURDAY  ON  SUNDAY 90 tablet 3   hydrochlorothiazide (HYDRODIURIL) 25 MG tablet Take 1 tablet by mouth once daily 90 tablet 0   lisinopril (ZESTRIL) 20 MG tablet Take 1 tablet (20 mg total) by mouth daily. 90 tablet 3   metoprolol tartrate (LOPRESSOR) 50 MG tablet Take 1 tablet (50 mg total) by mouth 2 (two) times daily. 180 tablet 3   oxyCODONE-acetaminophen (PERCOCET) 5-325 MG tablet Take 1 tablet by mouth every 4 (four) hours as needed for severe pain (stop hydrocodone). 30 tablet 0   simvastatin (ZOCOR) 20 MG tablet TAKE 1 TABLET BY MOUTH AT BEDTIME 90 tablet 0   vitamin B-12 (CYANOCOBALAMIN) 1000 MCG tablet Take 1,000 mcg by mouth daily.     warfarin (COUMADIN) 5 MG tablet TAKE 1 TABLET BY MOUTH ON MONDAY, WED, Friday, SATURDAY AND 1/2 TAB BY MOUTH OTHER  DAYS 60 tablet 0   No current facility-administered medications on file prior to visit.    Past Medical History:  Diagnosis Date   Arthritis    both shoulders   Atrial fibrillation ([place])    Breast cancer ([place])    Cancer ([place])    left breast   Cataracts, bilateral    Diverticulitis    Diverticulosis     Hemorrhoids    internal   Hiatal hernia    Hyperlipidemia    takes Zocor daily   Hypertension    takes Amlodipine daily   Joint pain    both shoulders   Macular degeneration 02/2003   Nasal congestion    Nocturia    Obesity    Osteoporosis    PONV (postoperative nausea and vomiting)    Prediabetes    Shortness of breath    with exertion   Sinus drainage    Urinary frequency    Urinary incontinence    Past Surgical History:  Procedure Laterality Date   bilateral knee replacements  2008   BREAST BIOPSY  05/04/2011   Procedure: BREAST BIOPSY WITH NEEDLE LOCALIZATION;  Surgeon: Judieth Keens, DO;  Location: [facility];  Service: General;  Laterality: Left;   CARDIOVERSION N/A 09/15/2012   Procedure: CARDIOVERSION;  Surgeon: Laverda Page, MD;  Location: [facility];  Service: Cardiovascular;  Laterality: N/A;  h&p in file-HW   COLONOSCOPY     [place]     outpatient "done in doctor's office"   JOINT REPLACEMENT  2008   bilateral knee replacement  left breast biopsy  1984   right breast biopsy  1993    No Known Allergies Social History   Socioeconomic History   Marital status: Widowed    Spouse name: Not on file   Number of children: Not on file   Years of education: Not on file   Highest education level: Not on file  Occupational History   Occupation: retired  Tobacco Use   Smoking status: Former    Packs/day: 0.25    Years: 10.00    Pack years: 2.50    Types: Cigarettes    Quit date: 05/21/1971    Years since quitting: 49.4   Smokeless tobacco: Never   Tobacco comments:    quit in the 60's  Substance and Sexual Activity   Alcohol use: No   Drug use: No   Sexual activity: Not Currently  Other Topics Concern   Not on file  Social History Narrative   Not on file   Social Determinants of Health   Financial Resource Strain: Not on file  Food Insecurity: Not on file  Transportation Needs: Not on file   Physical Activity: Not on file  Stress: Not on file  Social Connections: Not on file  Intimate Partner Violence: Not on file      Review of Systems  All other systems reviewed and are negative.     Objective:   Physical Exam Vitals reviewed.  Constitutional:      General: She is not in acute distress.    Appearance: She is well-developed.  Cardiovascular:     Rate and Rhythm: Normal rate. Rhythm irregular.     Heart sounds: Normal heart sounds.  Pulmonary:     Effort: Pulmonary effort is normal. No respiratory distress.     Breath sounds: Normal breath sounds. No wheezing or rales.  Abdominal:     General: Bowel sounds are normal.     Palpations: Abdomen is soft.  Neurological:     Mental Status: She is alert.     Motor: No abnormal muscle tone.     Deep Tendon Reflexes: Reflexes are normal and symmetric.       Assessment & Plan:  Chronic atrial fibrillation ([place]) - Plan: PT with INR/Fingerstick  INR is therapeutic.  Heart rate is controlled.  Patient denies any dizziness or chest pain or syncope or palpitations or shortness of breath.  Recheck INR in 6 weeks.

## 2020-11-05 DIAGNOSIS — U071 COVID-19: Secondary | ICD-10-CM | POA: Diagnosis not present

## 2020-11-17 ENCOUNTER — Other Ambulatory Visit: Payer: Self-pay | Admitting: Family Medicine

## 2020-11-25 ENCOUNTER — Ambulatory Visit (INDEPENDENT_AMBULATORY_CARE_PROVIDER_SITE_OTHER): Payer: Medicare Other | Admitting: Family Medicine

## 2020-11-25 ENCOUNTER — Encounter: Payer: Self-pay | Admitting: Family Medicine

## 2020-11-25 ENCOUNTER — Other Ambulatory Visit: Payer: Self-pay

## 2020-11-25 VITALS — BP 134/78 | HR 72 | Temp 98.3°F | Resp 14 | Ht 59.0 in | Wt 147.0 lb

## 2020-11-25 DIAGNOSIS — I482 Chronic atrial fibrillation, unspecified: Secondary | ICD-10-CM

## 2020-11-25 DIAGNOSIS — Z5181 Encounter for therapeutic drug level monitoring: Secondary | ICD-10-CM

## 2020-11-25 DIAGNOSIS — Z7901 Long term (current) use of anticoagulants: Secondary | ICD-10-CM

## 2020-11-25 LAB — PT WITH INR/FINGERSTICK
INR, fingerstick: 2.4 ratio — ABNORMAL HIGH
PT, fingerstick: 28.4 s — ABNORMAL HIGH (ref 10.5–13.1)

## 2020-11-25 NOTE — Progress Notes (Signed)
Subjective:    Patient ID: Kathy [name], female    DOB: August 02, 1927, 87 y.o.   MRN: [id number]  She is here today to recheck her INR.  She has been taking Coumadin 5 mg 4 days a week (MWFSat).  She takes 2.5 mg 3 days a week (TRSun).  INR today is therapeutic at 2.4.   She denies any bleeding or bruising.  She denies any syncope or TIA-like symptoms.  Otherwise she is doing well with no concerns.  However her resting heart rate is quite low.  Today she is in the 50s on my exam  Current Outpatient Medications on File Prior to Visit  Medication Sig Dispense Refill   Calcium Carbonate-Vitamin D 600-400 MG-UNIT tablet Take 1 tablet by mouth 2 (two) times daily.     cholecalciferol (VITAMIN D) 400 UNITS TABS Take 400 Units by mouth daily. Take in addition to Caltrate with Vitamin D to get 1200 units of Vitamin D     digoxin (LANOXIN) 0.125 MG tablet TAKE 1 TABLET BY MOUTH ONCE DAILY DO  NOT  TAKE  ON  SATURDAY  ON  SUNDAY 90 tablet 3   hydrochlorothiazide (HYDRODIURIL) 25 MG tablet Take 1 tablet by mouth once daily 90 tablet 0   lisinopril (ZESTRIL) 20 MG tablet Take 1 tablet (20 mg total) by mouth daily. 90 tablet 3   metoprolol tartrate (LOPRESSOR) 50 MG tablet Take 1 tablet (50 mg total) by mouth 2 (two) times daily. 180 tablet 3   oxyCODONE-acetaminophen (PERCOCET) 5-325 MG tablet Take 1 tablet by mouth every 4 (four) hours as needed for severe pain (stop hydrocodone). 30 tablet 0   simvastatin (ZOCOR) 20 MG tablet TAKE 1 TABLET BY MOUTH AT BEDTIME 90 tablet 0   vitamin B-12 (CYANOCOBALAMIN) 1000 MCG tablet Take 1,000 mcg by mouth daily.     warfarin (COUMADIN) 5 MG tablet TAKE ONE TABLET BY MOUTH ON MONDAY, WED, FRIDAY, SATURDAY AND ONE-HALF TABLET BY MOUTH OTHER DAYS. 60 tablet 0   No current facility-administered medications on file prior to visit.    Past Medical History:  Diagnosis Date   Arthritis    both shoulders   Atrial fibrillation ([place])    Breast cancer ([place])     Cancer ([place])    left breast   Cataracts, bilateral    Diverticulitis    Diverticulosis    Hemorrhoids    internal   Hiatal hernia    Hyperlipidemia    takes Zocor daily   Hypertension    takes Amlodipine daily   Joint pain    both shoulders   Macular degeneration 02/2003   Nasal congestion    Nocturia    Obesity    Osteoporosis    PONV (postoperative nausea and vomiting)    Prediabetes    Shortness of breath    with exertion   Sinus drainage    Urinary frequency    Urinary incontinence    Past Surgical History:  Procedure Laterality Date   bilateral knee replacements  2008   BREAST BIOPSY  05/04/2011   Procedure: BREAST BIOPSY WITH NEEDLE LOCALIZATION;  Surgeon: Judieth Keens, DO;  Location: [facility];  Service: General;  Laterality: Left;   CARDIOVERSION N/A 09/15/2012   Procedure: CARDIOVERSION;  Surgeon: Laverda Page, MD;  Location: [facility];  Service: Cardiovascular;  Laterality: N/A;  h&p in file-HW   COLONOSCOPY     ESOPHAGOGASTRODUODENOSCOPY     HEMORRHOID SURGERY  outpatient "done in doctor's office"   JOINT REPLACEMENT  2008   bilateral knee replacement   left breast biopsy  1984   right breast biopsy  1993    No Known Allergies Social History   Socioeconomic History   Marital status: Widowed    Spouse name: Not on file   Number of children: Not on file   Years of education: Not on file   Highest education level: Not on file  Occupational History   Occupation: retired  Tobacco Use   Smoking status: Former    Packs/day: 0.25    Years: 10.00    Pack years: 2.50    Types: Cigarettes    Quit date: 05/21/1971    Years since quitting: 49.5   Smokeless tobacco: Never   Tobacco comments:    quit in the 60's  Substance and Sexual Activity   Alcohol use: No   Drug use: No   Sexual activity: Not Currently  Other Topics Concern   Not on file  Social History Narrative   Not on file   Social Determinants of Health   Financial Resource  Strain: Not on file  Food Insecurity: Not on file  Transportation Needs: Not on file  Physical Activity: Not on file  Stress: Not on file  Social Connections: Not on file  Intimate Partner Violence: Not on file      Review of Systems  All other systems reviewed and are negative.     Objective:   Physical Exam Vitals reviewed.  Constitutional:      General: She is not in acute distress.    Appearance: She is well-developed.  Cardiovascular:     Rate and Rhythm: Normal rate. Rhythm irregular.     Heart sounds: Normal heart sounds.  Pulmonary:     Effort: Pulmonary effort is normal. No respiratory distress.     Breath sounds: Normal breath sounds. No wheezing or rales.  Abdominal:     General: Bowel sounds are normal.     Palpations: Abdomen is soft.  Neurological:     Mental Status: She is alert.     Motor: No abnormal muscle tone.     Deep Tendon Reflexes: Reflexes are normal and symmetric.       Assessment & Plan:  No diagnosis found.  INR is therapeutic.  Patient seems bradycardic on exam.  I asked her to temporarily discontinue digoxin and then recheck INR in 6 weeks.

## 2021-01-03 DIAGNOSIS — H52223 Regular astigmatism, bilateral: Secondary | ICD-10-CM | POA: Diagnosis not present

## 2021-01-03 DIAGNOSIS — Z9841 Cataract extraction status, right eye: Secondary | ICD-10-CM | POA: Diagnosis not present

## 2021-01-03 DIAGNOSIS — H353131 Nonexudative age-related macular degeneration, bilateral, early dry stage: Secondary | ICD-10-CM | POA: Diagnosis not present

## 2021-01-03 DIAGNOSIS — Z9842 Cataract extraction status, left eye: Secondary | ICD-10-CM | POA: Diagnosis not present

## 2021-01-06 ENCOUNTER — Encounter: Payer: Self-pay | Admitting: Family Medicine

## 2021-01-06 ENCOUNTER — Other Ambulatory Visit: Payer: Self-pay

## 2021-01-06 ENCOUNTER — Ambulatory Visit (INDEPENDENT_AMBULATORY_CARE_PROVIDER_SITE_OTHER): Payer: Medicare Other | Admitting: Family Medicine

## 2021-01-06 DIAGNOSIS — I482 Chronic atrial fibrillation, unspecified: Secondary | ICD-10-CM

## 2021-01-06 LAB — PT WITH INR/FINGERSTICK
INR, fingerstick: 2.3 ratio — ABNORMAL HIGH
PT, fingerstick: 27.5 s — ABNORMAL HIGH (ref 10.5–13.1)

## 2021-01-06 NOTE — Progress Notes (Signed)
Subjective:    Patient ID: Kathy [name], female    DOB: [birth year], 87 y.o.   MRN: [id number] 11/25/20 She is here today to recheck her INR.  She has been taking Coumadin 5 mg 4 days a week (MWFSat).  She takes 2.5 mg 3 days a week (TRSun).  INR today is therapeutic at 2.4.   She denies any bleeding or bruising.  She denies any syncope or TIA-like symptoms.  Otherwise she is doing well with no concerns.  However her resting heart rate is quite low.  Today she is in the 50s on my exam.  At that time, my plan was: INR is therapeutic.  Patient seems bradycardic on exam.  I asked her to temporarily discontinue digoxin and then recheck INR in 6 weeks.  01/06/21 INR therapeutic at 2.3.  She denies any palpitations or tachycardia off digoxin.   Current Outpatient Medications on File Prior to Visit  Medication Sig Dispense Refill   Calcium Carbonate-Vitamin D 600-400 MG-UNIT tablet Take 1 tablet by mouth 2 (two) times daily.     cholecalciferol (VITAMIN D) 400 UNITS TABS Take 400 Units by mouth daily. Take in addition to Caltrate with Vitamin D to get 1200 units of Vitamin D     digoxin (LANOXIN) 0.125 MG tablet TAKE 1 TABLET BY MOUTH ONCE DAILY DO  NOT  TAKE  ON  SATURDAY  ON  SUNDAY 90 tablet 3   hydrochlorothiazide (HYDRODIURIL) 25 MG tablet Take 1 tablet by mouth once daily 90 tablet 0   lisinopril (ZESTRIL) 20 MG tablet Take 1 tablet (20 mg total) by mouth daily. 90 tablet 3   metoprolol tartrate (LOPRESSOR) 50 MG tablet Take 1 tablet (50 mg total) by mouth 2 (two) times daily. 180 tablet 3   oxyCODONE-acetaminophen (PERCOCET) 5-325 MG tablet Take 1 tablet by mouth every 4 (four) hours as needed for severe pain (stop hydrocodone). 30 tablet 0   simvastatin (ZOCOR) 20 MG tablet TAKE 1 TABLET BY MOUTH AT BEDTIME 90 tablet 0   vitamin B-12 (CYANOCOBALAMIN) 1000 MCG tablet Take 1,000 mcg by mouth daily.     warfarin (COUMADIN) 5 MG tablet TAKE ONE TABLET BY MOUTH ON MONDAY, WED, FRIDAY, SATURDAY  AND ONE-HALF TABLET BY MOUTH OTHER DAYS. 60 tablet 0   No current facility-administered medications on file prior to visit.    Past Medical History:  Diagnosis Date   Arthritis    both shoulders   Atrial fibrillation ([place])    Breast cancer ([place])    Cancer ([place])    left breast   Cataracts, bilateral    Diverticulitis    Diverticulosis    Hemorrhoids    internal   Hiatal hernia    Hyperlipidemia    takes Zocor daily   Hypertension    takes Amlodipine daily   Joint pain    both shoulders   Macular degeneration 02/2003   Nasal congestion    Nocturia    Obesity    Osteoporosis    PONV (postoperative nausea and vomiting)    Prediabetes    Shortness of breath    with exertion   Sinus drainage    Urinary frequency    Urinary incontinence    Past Surgical History:  Procedure Laterality Date   bilateral knee replacements  2008   BREAST BIOPSY  05/04/2011   Procedure: BREAST BIOPSY WITH NEEDLE LOCALIZATION;  Surgeon: Judieth Keens, DO;  Location: [facility];  Service: General;  Laterality: Left;  CARDIOVERSION N/A 09/15/2012   Procedure: CARDIOVERSION;  Surgeon: Laverda Page, MD;  Location: [facility];  Service: Cardiovascular;  Laterality: N/A;  h&p in file-HW   COLONOSCOPY     ESOPHAGOGASTRODUODENOSCOPY     HEMORRHOID SURGERY     outpatient "done in doctor's office"   JOINT REPLACEMENT  2008   bilateral knee replacement   left breast biopsy  1984   right breast biopsy  1993    No Known Allergies Social History   Socioeconomic History   Marital status: Widowed    Spouse name: Not on file   Number of children: Not on file   Years of education: Not on file   Highest education level: Not on file  Occupational History   Occupation: retired  Tobacco Use   Smoking status: Former    Packs/day: 0.25    Years: 10.00    Pack years: 2.50    Types: Cigarettes    Quit date: 05/21/1971    Years since quitting: 49.6   Smokeless tobacco: Never   Tobacco comments:     quit in the 60's  Substance and Sexual Activity   Alcohol use: No   Drug use: No   Sexual activity: Not Currently  Other Topics Concern   Not on file  Social History Narrative   Not on file   Social Determinants of Health   Financial Resource Strain: Not on file  Food Insecurity: Not on file  Transportation Needs: Not on file  Physical Activity: Not on file  Stress: Not on file  Social Connections: Not on file  Intimate Partner Violence: Not on file      Review of Systems  All other systems reviewed and are negative.     Objective:   Physical Exam Vitals reviewed.  Constitutional:      General: She is not in acute distress.    Appearance: She is well-developed.  Cardiovascular:     Rate and Rhythm: Normal rate. Rhythm irregular.     Heart sounds: Normal heart sounds.  Pulmonary:     Effort: Pulmonary effort is normal. No respiratory distress.     Breath sounds: Normal breath sounds. No wheezing or rales.  Abdominal:     General: Bowel sounds are normal.     Palpations: Abdomen is soft.  Neurological:     Mental Status: She is alert.     Motor: No abnormal muscle tone.     Deep Tendon Reflexes: Reflexes are normal and symmetric.       Assessment & Plan:    Chronic atrial fibrillation ([place]) - Plan: PT with INR/Fingerstick INR therapeutic.  Bradycardia resolved off digoxin.  Recheck in 6 weeks. Recommended covid booster.

## 2021-02-02 ENCOUNTER — Other Ambulatory Visit: Payer: Self-pay | Admitting: Family Medicine

## 2021-02-07 DIAGNOSIS — Z23 Encounter for immunization: Secondary | ICD-10-CM | POA: Diagnosis not present

## 2021-02-17 ENCOUNTER — Other Ambulatory Visit: Payer: Self-pay

## 2021-02-17 ENCOUNTER — Ambulatory Visit (INDEPENDENT_AMBULATORY_CARE_PROVIDER_SITE_OTHER): Payer: Medicare Other | Admitting: Family Medicine

## 2021-02-17 ENCOUNTER — Encounter: Payer: Self-pay | Admitting: Family Medicine

## 2021-02-17 VITALS — BP 126/80 | HR 60 | Temp 98.3°F | Resp 16 | Ht 59.0 in | Wt 149.0 lb

## 2021-02-17 DIAGNOSIS — I482 Chronic atrial fibrillation, unspecified: Secondary | ICD-10-CM | POA: Diagnosis not present

## 2021-02-17 DIAGNOSIS — Z23 Encounter for immunization: Secondary | ICD-10-CM | POA: Diagnosis not present

## 2021-02-17 LAB — PT WITH INR/FINGERSTICK
INR, fingerstick: 2 ratio — ABNORMAL HIGH
PT, fingerstick: 23.7 s — ABNORMAL HIGH (ref 10.5–13.1)

## 2021-02-17 NOTE — Progress Notes (Signed)
Subjective:    Patient ID: Kathy [name], female    DOB: 03/16/1928, 87 y.o.   MRN: [id number]  She is here today to recheck her INR.  She has been taking Coumadin 5 mg 4 days a week (MWFSat).  She takes 2.5 mg 3 days a week (TRSun).  INR is therapeutic at 2.  Continues to deny palpitations or racing heart beats off digoxin  Current Outpatient Medications on File Prior to Visit  Medication Sig Dispense Refill   Calcium Carbonate-Vitamin D 600-400 MG-UNIT tablet Take 1 tablet by mouth 2 (two) times daily.     cholecalciferol (VITAMIN D) 400 UNITS TABS Take 400 Units by mouth daily. Take in addition to Caltrate with Vitamin D to get 1200 units of Vitamin D     hydrochlorothiazide (HYDRODIURIL) 25 MG tablet Take 1 tablet by mouth once daily 90 tablet 0   lisinopril (ZESTRIL) 20 MG tablet Take 1 tablet (20 mg total) by mouth daily. 90 tablet 3   metoprolol tartrate (LOPRESSOR) 50 MG tablet Take 1 tablet (50 mg total) by mouth 2 (two) times daily. 180 tablet 3   oxyCODONE-acetaminophen (PERCOCET) 5-325 MG tablet Take 1 tablet by mouth every 4 (four) hours as needed for severe pain (stop hydrocodone). 30 tablet 0   simvastatin (ZOCOR) 20 MG tablet TAKE 1 TABLET BY MOUTH AT BEDTIME 90 tablet 0   vitamin B-12 (CYANOCOBALAMIN) 1000 MCG tablet Take 1,000 mcg by mouth daily.     warfarin (COUMADIN) 5 MG tablet TAKE ONE TABLET BY MOUTH ON MONDAY, WED, FRIDAY, SATURDAY AND ONE-HALF TABLET BY MOUTH OTHER DAYS. 60 tablet 0   No current facility-administered medications on file prior to visit.    Past Medical History:  Diagnosis Date   Arthritis    both shoulders   Atrial fibrillation ([place])    Breast cancer ([place])    Cancer ([place])    left breast   Cataracts, bilateral    Diverticulitis    Diverticulosis    Hemorrhoids    internal   Hiatal hernia    Hyperlipidemia    takes Zocor daily   Hypertension    takes Amlodipine daily   Joint pain    both shoulders   Macular degeneration 02/2003    Nasal congestion    Nocturia    Obesity    Osteoporosis    PONV (postoperative nausea and vomiting)    Prediabetes    Shortness of breath    with exertion   Sinus drainage    Urinary frequency    Urinary incontinence    Past Surgical History:  Procedure Laterality Date   bilateral knee replacements  2008   BREAST BIOPSY  05/04/2011   Procedure: BREAST BIOPSY WITH NEEDLE LOCALIZATION;  Surgeon: Judieth Keens, DO;  Location: [facility];  Service: General;  Laterality: Left;   CARDIOVERSION N/A 09/15/2012   Procedure: CARDIOVERSION;  Surgeon: Laverda Page, MD;  Location: [place];  Service: Cardiovascular;  Laterality: N/A;  h&p in file-HW   COLONOSCOPY     ESOPHAGOGASTRODUODENOSCOPY     HEMORRHOID SURGERY     outpatient "done in doctor's office"   JOINT REPLACEMENT  2008   bilateral knee replacement   left breast biopsy  1984   right breast biopsy  1993    No Known Allergies Social History   Socioeconomic History   Marital status: Widowed    Spouse name: Not on file   Number of children: Not on file  Years of education: Not on file   Highest education level: Not on file  Occupational History   Occupation: retired  Tobacco Use   Smoking status: Former    Packs/day: 0.25    Years: 10.00    Pack years: 2.50    Types: Cigarettes    Quit date: 05/21/1971    Years since quitting: 49.7   Smokeless tobacco: Never   Tobacco comments:    quit in the 60's  Substance and Sexual Activity   Alcohol use: No   Drug use: No   Sexual activity: Not Currently  Other Topics Concern   Not on file  Social History Narrative   Not on file   Social Determinants of Health   Financial Resource Strain: Not on file  Food Insecurity: Not on file  Transportation Needs: Not on file  Physical Activity: Not on file  Stress: Not on file  Social Connections: Not on file  Intimate Partner Violence: Not on file      Review of Systems  All other systems reviewed and are  negative.     Objective:   Physical Exam Vitals reviewed.  Constitutional:      General: She is not in acute distress.    Appearance: She is well-developed.  Cardiovascular:     Rate and Rhythm: Normal rate. Rhythm irregular.     Heart sounds: Normal heart sounds.  Pulmonary:     Effort: Pulmonary effort is normal. No respiratory distress.     Breath sounds: Normal breath sounds. No wheezing or rales.  Abdominal:     General: Bowel sounds are normal.     Palpations: Abdomen is soft.  Neurological:     Mental Status: She is alert.     Motor: No abnormal muscle tone.     Deep Tendon Reflexes: Reflexes are normal and symmetric.       Assessment & Plan:   Chronic atrial fibrillation ([facility])  INR therapeutic.  Recheck in 6 weeks.

## 2021-03-27 ENCOUNTER — Telehealth: Payer: Self-pay | Admitting: Family Medicine

## 2021-03-27 NOTE — Telephone Encounter (Signed)
No answer unable to answer message for patient to call back and schedule Medicare Annual Wellness Visit (AWV) in office.   If not able to come in office, please offer to do virtually or by telephone.   No history of  AWV: Per [employer] eligible for AWVI as of 04/27/2009 per [employer]   Please schedule at anytime with [facility] Health Advisor.  If any questions, please contact me at [phone]

## 2021-03-29 ENCOUNTER — Encounter: Payer: Self-pay | Admitting: Family Medicine

## 2021-03-29 DIAGNOSIS — Z1231 Encounter for screening mammogram for malignant neoplasm of breast: Secondary | ICD-10-CM | POA: Diagnosis not present

## 2021-04-14 ENCOUNTER — Encounter: Payer: Self-pay | Admitting: Family Medicine

## 2021-04-14 ENCOUNTER — Ambulatory Visit (INDEPENDENT_AMBULATORY_CARE_PROVIDER_SITE_OTHER): Payer: Medicare Other | Admitting: Family Medicine

## 2021-04-14 ENCOUNTER — Other Ambulatory Visit: Payer: Self-pay

## 2021-04-14 VITALS — BP 128/82 | HR 94 | Resp 17 | Ht 59.0 in | Wt 153.0 lb

## 2021-04-14 DIAGNOSIS — I482 Chronic atrial fibrillation, unspecified: Secondary | ICD-10-CM

## 2021-04-14 DIAGNOSIS — R410 Disorientation, unspecified: Secondary | ICD-10-CM

## 2021-04-14 LAB — URINALYSIS, ROUTINE W REFLEX MICROSCOPIC
Bilirubin Urine: NEGATIVE
Casts: NONE SEEN /LPF
Crystals: NONE SEEN /HPF
Glucose, UA: NEGATIVE
Hyaline Cast: NONE SEEN /LPF
Ketones, ur: NEGATIVE
Leukocytes,Ua: NEGATIVE
Nitrite: NEGATIVE
Specific Gravity, Urine: 1.025 (ref 1.001–1.035)
Yeast: NONE SEEN /HPF
pH: 5.5 (ref 5.0–8.0)

## 2021-04-14 LAB — PT WITH INR/FINGERSTICK
INR, fingerstick: 1.5 ratio — ABNORMAL HIGH
PT, fingerstick: 17.6 s — ABNORMAL HIGH (ref 10.5–13.1)

## 2021-04-14 LAB — MICROSCOPIC MESSAGE

## 2021-04-14 MED ORDER — DOXYCYCLINE HYCLATE 100 MG PO TABS: 100.0000 mg | ORAL_TABLET | Freq: Two times a day (BID) | ORAL | 0 refills | Status: DC

## 2021-04-14 NOTE — Progress Notes (Deleted)
Subjective:    Patient ID: Kathy [name], female    DOB: 1927/05/15, 87 y.o.   MRN: [id number]  She is here today to recheck her INR.  She has been taking Coumadin 5 mg 4 days a week (MWFSat).  She takes 2.5 mg 3 days a week (TRSun).  INR is therapeutic at 2.  Continues to deny palpitations or racing heart beats off digoxin  Current Outpatient Medications on File Prior to Visit  Medication Sig Dispense Refill   Calcium Carbonate-Vitamin D 600-400 MG-UNIT tablet Take 1 tablet by mouth 2 (two) times daily.     cholecalciferol (VITAMIN D) 400 UNITS TABS Take 400 Units by mouth daily. Take in addition to Caltrate with Vitamin D to get 1200 units of Vitamin D     hydrochlorothiazide (HYDRODIURIL) 25 MG tablet Take 1 tablet by mouth once daily 90 tablet 0   lisinopril (ZESTRIL) 20 MG tablet Take 1 tablet (20 mg total) by mouth daily. 90 tablet 3   metoprolol tartrate (LOPRESSOR) 50 MG tablet Take 1 tablet (50 mg total) by mouth 2 (two) times daily. 180 tablet 3   oxyCODONE-acetaminophen (PERCOCET) 5-325 MG tablet Take 1 tablet by mouth every 4 (four) hours as needed for severe pain (stop hydrocodone). 30 tablet 0   simvastatin (ZOCOR) 20 MG tablet TAKE 1 TABLET BY MOUTH AT BEDTIME 90 tablet 0   vitamin B-12 (CYANOCOBALAMIN) 1000 MCG tablet Take 1,000 mcg by mouth daily.     warfarin (COUMADIN) 5 MG tablet TAKE ONE TABLET BY MOUTH ON MONDAY, WED, FRIDAY, SATURDAY AND ONE-HALF TABLET BY MOUTH OTHER DAYS. 60 tablet 0   No current facility-administered medications on file prior to visit.    Past Medical History:  Diagnosis Date   Arthritis    both shoulders   Atrial fibrillation ([place])    Breast cancer ([place])    Cancer ([place])    left breast   Cataracts, bilateral    Diverticulitis    Diverticulosis    Hemorrhoids    internal   Hiatal hernia    Hyperlipidemia    takes Zocor daily   Hypertension    takes Amlodipine daily   Joint pain    both shoulders   Macular  degeneration 02/2003   Nasal congestion    Nocturia    Obesity    Osteoporosis    PONV (postoperative nausea and vomiting)    Prediabetes    Shortness of breath    with exertion   Sinus drainage    Urinary frequency    Urinary incontinence    Past Surgical History:  Procedure Laterality Date   bilateral knee replacements  2008   BREAST BIOPSY  05/04/2011   Procedure: BREAST BIOPSY WITH NEEDLE LOCALIZATION;  Surgeon: Judieth Keens, DO;  Location: [facility];  Service: General;  Laterality: Left;   CARDIOVERSION N/A 09/15/2012   Procedure: CARDIOVERSION;  Surgeon: Laverda Page, MD;  Location: [facility];  Service: Cardiovascular;  Laterality: N/A;  h&p in file-HW   COLONOSCOPY     ESOPHAGOGASTRODUODENOSCOPY     HEMORRHOID SURGERY     outpatient "done in doctor's office"   JOINT REPLACEMENT  2008   bilateral knee replacement   left breast biopsy  1984   right breast biopsy  1993    No Known Allergies Social History   Socioeconomic History   Marital status: Widowed    Spouse name: Not on file   Number of children: Not on file  Years of education: Not on file   Highest education level: Not on file  Occupational History   Occupation: retired  Tobacco Use   Smoking status: Former    Packs/day: 0.25    Years: 10.00    Pack years: 2.50    Types: Cigarettes    Quit date: 05/21/1971    Years since quitting: 49.9   Smokeless tobacco: Never   Tobacco comments:    quit in the 60's  Substance and Sexual Activity   Alcohol use: No   Drug use: No   Sexual activity: Not Currently  Other Topics Concern   Not on file  Social History Narrative   Not on file   Social Determinants of Health   Financial Resource Strain: Not on file  Food Insecurity: Not on file  Transportation Needs: Not on file  Physical Activity: Not on file  Stress: Not on file  Social Connections: Not on file  Intimate Partner Violence: Not on file      Review  of Systems  All other systems reviewed and are negative.     Objective:   Physical Exam Vitals reviewed.  Constitutional:      General: She is not in acute distress.    Appearance: She is well-developed.  Cardiovascular:     Rate and Rhythm: Normal rate. Rhythm irregular.     Heart sounds: Normal heart sounds.  Pulmonary:     Effort: Pulmonary effort is normal. No respiratory distress.     Breath sounds: Normal breath sounds. No wheezing or rales.  Abdominal:     General: Bowel sounds are normal.     Palpations: Abdomen is soft.  Neurological:     Mental Status: She is alert.     Motor: No abnormal muscle tone.     Deep Tendon Reflexes: Reflexes are normal and symmetric.       Assessment & Plan:   No diagnosis found.  INR therapeutic.  Recheck in 6 weeks.

## 2021-04-14 NOTE — Progress Notes (Signed)
Subjective:    Patient ID: Kathy [name], female    DOB: June 04, 1927, 87 y.o.   MRN: [id number]  HPI  Patient is a very sweet 87 year old Caucasian female with a history of atrial fibrillation.  Today she lost her balance and fell at home landing on her left arm and left shoulder.  She denies hitting her head.  She denies any loss of consciousness.  However, all today she has had trouble with her balance.  She states that she had trouble getting out of the chair.  Does not show that she had an episode of urinary incontinence while sitting in chair.  She also seems a little bit confused to me today.  This is not her baseline.  I have to repeat several times with Monday.  She thinks that yesterday was Monday.  This is not her baseline.  She is able to tell me exactly how she takes her Coumadin for memory which is impressive.  She has had a cough for 3 or 3 days that is nonproductive.  She denies any chest pain shortness of breath pleurisy or hemoptysis.  The cough is nonproductive.  She denies any dysuria or abdominal pain or back pain.  She denies any strokelike symptoms.  Muscle strength is 5/5 equal and symmetric in the upper and lower extremities.  However she does have an unsteady gait.  She has to walk with a cane at her baseline.  There is no foot drop.  She is not dragging her leg.  There is no facial droop.  She is not slurring her speech.  However she does seem a little bit confused. Past Medical History:  Diagnosis Date   Arthritis    both shoulders   Atrial fibrillation ([place])    Breast cancer ([place])    Cancer ([place])    left breast   Cataracts, bilateral    Diverticulitis    Diverticulosis    Hemorrhoids    internal   Hiatal hernia    Hyperlipidemia    takes Zocor daily   Hypertension    takes Amlodipine daily   Joint pain    both shoulders   Macular degeneration 02/2003   Nasal congestion    Nocturia    Obesity    Osteoporosis    PONV (postoperative nausea and vomiting)     Prediabetes    Shortness of breath    with exertion   Sinus drainage    Urinary frequency    Urinary incontinence    Past Surgical History:  Procedure Laterality Date   bilateral knee replacements  2008   BREAST BIOPSY  05/04/2011   Procedure: BREAST BIOPSY WITH NEEDLE LOCALIZATION;  Surgeon: Judieth Keens, DO;  Location: [facility];  Service: General;  Laterality: Left;   CARDIOVERSION N/A 09/15/2012   Procedure: CARDIOVERSION;  Surgeon: Laverda Page, MD;  Location: [facility];  Service: Cardiovascular;  Laterality: N/A;  h&p in file-HW   COLONOSCOPY     ESOPHAGOGASTRODUODENOSCOPY     HEMORRHOID SURGERY     outpatient "done in doctor's office"   JOINT REPLACEMENT  2008   bilateral knee replacement   left breast biopsy  1984   right breast biopsy  1993   Current Outpatient Medications on File Prior to Visit  Medication Sig Dispense Refill   Calcium Carbonate-Vitamin D 600-400 MG-UNIT tablet Take 1 tablet by mouth 2 (two) times daily.     cholecalciferol (VITAMIN D) 400 UNITS TABS Take 400 Units by mouth daily. Take  in addition to Caltrate with Vitamin D to get 1200 units of Vitamin D     hydrochlorothiazide (HYDRODIURIL) 25 MG tablet Take 1 tablet by mouth once daily 90 tablet 0   lisinopril (ZESTRIL) 20 MG tablet Take 1 tablet (20 mg total) by mouth daily. 90 tablet 3   metoprolol tartrate (LOPRESSOR) 50 MG tablet Take 1 tablet (50 mg total) by mouth 2 (two) times daily. 180 tablet 3   oxyCODONE-acetaminophen (PERCOCET) 5-325 MG tablet Take 1 tablet by mouth every 4 (four) hours as needed for severe pain (stop hydrocodone). 30 tablet 0   simvastatin (ZOCOR) 20 MG tablet TAKE 1 TABLET BY MOUTH AT BEDTIME 90 tablet 0   vitamin B-12 (CYANOCOBALAMIN) 1000 MCG tablet Take 1,000 mcg by mouth daily.     warfarin (COUMADIN) 5 MG tablet TAKE ONE TABLET BY MOUTH ON MONDAY, WED, FRIDAY, SATURDAY AND ONE-HALF TABLET BY MOUTH OTHER DAYS. 60 tablet 0   No current facility-administered  medications on file prior to visit.   Marland Kitchenall Social History   Socioeconomic History   Marital status: Widowed    Spouse name: Not on file   Number of children: Not on file   Years of education: Not on file   Highest education level: Not on file  Occupational History   Occupation: retired  Tobacco Use   Smoking status: Former    Packs/day: 0.25    Years: 10.00    Pack years: 2.50    Types: Cigarettes    Quit date: 05/21/1971    Years since quitting: 49.9   Smokeless tobacco: Never   Tobacco comments:    quit in the 60's  Substance and Sexual Activity   Alcohol use: No   Drug use: No   Sexual activity: Not Currently  Other Topics Concern   Not on file  Social History Narrative   Not on file   Social Determinants of Health   Financial Resource Strain: Not on file  Food Insecurity: Not on file  Transportation Needs: Not on file  Physical Activity: Not on file  Stress: Not on file  Social Connections: Not on file  Intimate Partner Violence: Not on file     Review of Systems  All other systems reviewed and are negative.     Objective:   Physical Exam Constitutional:      General: She is not in acute distress.    Appearance: Normal appearance. She is normal weight. She is not ill-appearing or toxic-appearing.  HENT:     Nose: No congestion or rhinorrhea.     Mouth/Throat:     Pharynx: No oropharyngeal exudate or posterior oropharyngeal erythema.  Cardiovascular:     Rate and Rhythm: Normal rate. Rhythm irregular.     Heart sounds: Normal heart sounds.  Pulmonary:     Effort: Pulmonary effort is normal. No respiratory distress.     Breath sounds: Normal breath sounds. No wheezing, rhonchi or rales.  Abdominal:     General: Bowel sounds are normal. There is no distension.     Palpations: Abdomen is soft.     Tenderness: There is no abdominal tenderness. There is no guarding.  Musculoskeletal:        General: No swelling or deformity.     Right lower leg: No  edema.     Left lower leg: No edema.  Skin:    Findings: No bruising or erythema.  Neurological:     General: No focal deficit present.     Mental  Status: She is alert and oriented to person, place, and time.     Gait: Gait abnormal.  Psychiatric:        Mood and Affect: Mood normal.        Behavior: Behavior normal.        Thought Content: Thought content normal.          Assessment & Plan:  Confusion - Plan: Urinalysis, Routine w reflex microscopic, CBC with Differential/Platelet, COMPLETE METABOLIC PANEL WITH GFR  Chronic atrial fibrillation (HCC) - Plan: PT with INR/Fingerstick, PT with INR/Fingerstick, CBC with Differential/Platelet, COMPLETE METABOLIC PANEL WITH GFR  INR is subtherapeutic at 1.5.  Urinalysis shows no urinary tract infection although it does show +2 blood.  No evidence of a stroke based on her gross neurologic exam.  She has a wide-based unsteady gait but this is her baseline.  She is slightly confused and she has been coughing more.  I will treat the patient for possible bronchitis with doxycycline 100 mg twice daily for 7 days.  Given its effect on Coumadin I would not change her Coumadin dose and I will recheck the patient in 1 week.  Obtain CBC and CMP to evaluate for any metabolic causes of confusion.  Reassess via telephone in 48 hours or sooner if worse and plan to recheck with him within 1 week

## 2021-04-15 ENCOUNTER — Telehealth: Payer: Self-pay

## 2021-04-15 LAB — COMPLETE METABOLIC PANEL WITH GFR
AG Ratio: 1.5 (calc) (ref 1.0–2.5)
ALT: 7 U/L (ref 6–29)
AST: 16 U/L (ref 10–35)
Albumin: 4 g/dL (ref 3.6–5.1)
Alkaline phosphatase (APISO): 53 U/L (ref 37–153)
BUN/Creatinine Ratio: 24 (calc) — ABNORMAL HIGH (ref 6–22)
BUN: 25 mg/dL (ref 7–25)
CO2: 23 mmol/L (ref 20–32)
Calcium: 9.1 mg/dL (ref 8.6–10.4)
Chloride: 102 mmol/L (ref 98–110)
Creat: 1.05 mg/dL — ABNORMAL HIGH (ref 0.60–0.95)
Globulin: 2.6 g/dL (calc) (ref 1.9–3.7)
Glucose, Bld: 91 mg/dL (ref 65–99)
Potassium: 4.3 mmol/L (ref 3.5–5.3)
Sodium: 138 mmol/L (ref 135–146)
Total Bilirubin: 1.4 mg/dL — ABNORMAL HIGH (ref 0.2–1.2)
Total Protein: 6.6 g/dL (ref 6.1–8.1)
eGFR: 50 mL/min/{1.73_m2} — ABNORMAL LOW (ref >=60)

## 2021-04-15 LAB — CBC WITH DIFFERENTIAL/PLATELET
Absolute Monocytes: 659 cells/uL (ref 200–950)
Basophils Absolute: 52 cells/uL (ref 0–200)
Basophils Relative: 0.7 %
Eosinophils Absolute: 37 cells/uL (ref 15–500)
Eosinophils Relative: 0.5 %
HCT: 38.1 % (ref 35.0–45.0)
Hemoglobin: 12.8 g/dL (ref 11.7–15.5)
Lymphs Abs: 1088 cells/uL (ref 850–3900)
MCH: 33.6 pg — ABNORMAL HIGH (ref 27.0–33.0)
MCHC: 33.6 g/dL (ref 32.0–36.0)
MCV: 100 fL (ref 80.0–100.0)
MPV: 10.6 fL (ref 7.5–12.5)
Monocytes Relative: 8.9 %
Neutro Abs: 5565 cells/uL (ref 1500–7800)
Neutrophils Relative %: 75.2 %
Platelets: 165 10*3/uL (ref 140–400)
RBC: 3.81 10*6/uL (ref 3.80–5.10)
RDW: 12.6 % (ref 11.0–15.0)
Total Lymphocyte: 14.7 %
WBC: 7.4 10*3/uL (ref 3.8–10.8)

## 2021-04-15 NOTE — Telephone Encounter (Signed)
-----   Message from Susy Frizzle, MD sent at 04/15/2021  6:41 AM EST ----- Overall, labs look good.  No explanation for confusion or dizziness.  Try to drink more water and take antibiotic for bronchitis.  Recheck in 48 hours.  If dizziness is not improving and if confusion is not improving, would recommend CT scan to rule out CVA

## 2021-04-15 NOTE — Telephone Encounter (Signed)
Informed patients nephew Mortimer Fries of results and recommendations.

## 2021-04-17 ENCOUNTER — Telehealth: Payer: Self-pay

## 2021-04-17 DIAGNOSIS — R062 Wheezing: Secondary | ICD-10-CM

## 2021-04-17 MED ORDER — ALBUTEROL SULFATE HFA 108 (90 BASE) MCG/ACT IN AERS: 2.0000 | INHALATION_SPRAY | Freq: Four times a day (QID) | RESPIRATORY_TRACT | 2 refills | Status: DC | PRN

## 2021-04-17 MED ORDER — AEROCHAMBER PLUS FLO-VU W/MASK MISC: 1.0000 | 1 refills | Status: DC | PRN

## 2021-04-17 NOTE — Telephone Encounter (Signed)
Pt's son called in to inform pcp about pt's progress since being seen in office. Pt's son states that pt is walking better but has developed some wheezing in her chest. Pt is staying with son at this time, and nurse/dr can call;   Cb#: [phone]

## 2021-04-17 NOTE — Telephone Encounter (Signed)
Spoke with pt's nephew and gave recommendations. Also reviewed inhaler use and chamber use. He asked to have a chamber as he is not sure pt has coordination enough to manage inhaler use without one. Rxs sent to [facility]. Nothing further needed at this time.

## 2021-04-19 ENCOUNTER — Other Ambulatory Visit: Payer: Self-pay | Admitting: Family Medicine

## 2021-04-24 ENCOUNTER — Ambulatory Visit (INDEPENDENT_AMBULATORY_CARE_PROVIDER_SITE_OTHER): Payer: Medicare Other | Admitting: Family Medicine

## 2021-04-24 ENCOUNTER — Encounter: Payer: Self-pay | Admitting: Family Medicine

## 2021-04-24 ENCOUNTER — Other Ambulatory Visit: Payer: Self-pay

## 2021-04-24 VITALS — BP 128/78 | HR 81 | Ht 59.0 in | Wt 153.0 lb

## 2021-04-24 DIAGNOSIS — I482 Chronic atrial fibrillation, unspecified: Secondary | ICD-10-CM

## 2021-04-24 LAB — PT WITH INR/FINGERSTICK
INR, fingerstick: 2.2 ratio — ABNORMAL HIGH
PT, fingerstick: 26 s — ABNORMAL HIGH (ref 10.5–13.1)

## 2021-04-24 MED ORDER — FLUTICASONE PROPIONATE 50 MCG/ACT NA SUSP: 2.0000 | Freq: Every day | NASAL | 6 refills | Status: DC

## 2021-04-24 NOTE — Progress Notes (Signed)
Subjective:    Patient ID: Kathy [name], female    DOB: February 17, 1928, 87 y.o.   MRN: [id number]  HPI  Patient is a very sweet 87 year old Caucasian female with a history of atrial fibrillation.  Recently, patient's INR was subtherapeutic at 1.5 and she was slightly confused.  She has also been battling an upper respiratory infection and has had a cough for few days prior.  Urinalysis showed +2 blood.  CBC and CMP were unremarkable.  We treated respiratory infection with doxycycline and asked the patient to return today to recheck her INR.  In the interval time, the confusion has seemed to improve per the son's report.  They are here today for follow-up.  INR therapeutic at 2.2.  Her balance has improved.  She has not fallen anymore.  Her confusion has resolved.  She is back to living independently at her home.  However she reports postnasal drip and rhinorrhea triggering a tickling cough.  She is also reporting occasional wheezing for which we gave her albuterol.  Even today on exam she does have faint expiratory wheezing although she has good strong breath sounds and no respiratory distress.  She does have congestion. Past Medical History:  Diagnosis Date   Arthritis    both shoulders   Atrial fibrillation ([place])    Breast cancer ([place])    Cancer ([place])    left breast   Cataracts, bilateral    Diverticulitis    Diverticulosis    Hemorrhoids    internal   Hiatal hernia    Hyperlipidemia    takes Zocor daily   Hypertension    takes Amlodipine daily   Joint pain    both shoulders   Macular degeneration 02/2003   Nasal congestion    Nocturia    Obesity    Osteoporosis    PONV (postoperative nausea and vomiting)    Prediabetes    Shortness of breath    with exertion   Sinus drainage    Urinary frequency    Urinary incontinence    Past Surgical History:  Procedure Laterality Date   bilateral knee replacements  2008   BREAST BIOPSY  05/04/2011   Procedure: BREAST BIOPSY WITH NEEDLE  LOCALIZATION;  Surgeon: Judieth Keens, DO;  Location: [facility];  Service: General;  Laterality: Left;   CARDIOVERSION N/A 09/15/2012   Procedure: CARDIOVERSION;  Surgeon: Laverda Page, MD;  Location: [facility];  Service: Cardiovascular;  Laterality: N/A;  h&p in file-HW   COLONOSCOPY     ESOPHAGOGASTRODUODENOSCOPY     HEMORRHOID SURGERY     outpatient "done in doctor's office"   JOINT REPLACEMENT  2008   bilateral knee replacement   left breast biopsy  1984   right breast biopsy  1993   Current Outpatient Medications on File Prior to Visit  Medication Sig Dispense Refill   albuterol (VENTOLIN HFA) 108 (90 Base) MCG/ACT inhaler Inhale 2 puffs into the lungs every 6 (six) hours as needed for wheezing or shortness of breath. 8 g 2   Calcium Carbonate-Vitamin D 600-400 MG-UNIT tablet Take 1 tablet by mouth 2 (two) times daily.     cholecalciferol (VITAMIN D) 400 UNITS TABS Take 400 Units by mouth daily. Take in addition to Caltrate with Vitamin D to get 1200 units of Vitamin D     hydrochlorothiazide (HYDRODIURIL) 25 MG tablet Take 1 tablet by mouth once daily 90 tablet 0   lisinopril (ZESTRIL) 20 MG tablet Take 1 tablet (20 mg total)  by mouth daily. 90 tablet 3   metoprolol tartrate (LOPRESSOR) 50 MG tablet Take 1 tablet (50 mg total) by mouth 2 (two) times daily. 180 tablet 3   oxyCODONE-acetaminophen (PERCOCET) 5-325 MG tablet Take 1 tablet by mouth every 4 (four) hours as needed for severe pain (stop hydrocodone). 30 tablet 0   simvastatin (ZOCOR) 20 MG tablet TAKE 1 TABLET BY MOUTH AT BEDTIME 90 tablet 0   Spacer/Aero-Holding Chambers (AEROCHAMBER PLUS FLO-VU W/MASK) MISC 1 each by Does not apply route as needed. 1 each 1   vitamin B-12 (CYANOCOBALAMIN) 1000 MCG tablet Take 1,000 mcg by mouth daily.     warfarin (COUMADIN) 5 MG tablet TAKE 1 TABLET BY MOUTH ON MONDAY, WEDNESDAY FRIDAY, SATURDAY AND 1/2 TABLET BY MOUTH OTHER DAYS 60 tablet 3   No current facility-administered  medications on file prior to visit.   Marland Kitchenall Social History   Socioeconomic History   Marital status: Widowed    Spouse name: Not on file   Number of children: Not on file   Years of education: Not on file   Highest education level: Not on file  Occupational History   Occupation: retired  Tobacco Use   Smoking status: Former    Packs/day: 0.25    Years: 10.00    Pack years: 2.50    Types: Cigarettes    Quit date: 05/21/1971    Years since quitting: 49.9   Smokeless tobacco: Never   Tobacco comments:    quit in the 60's  Substance and Sexual Activity   Alcohol use: No   Drug use: No   Sexual activity: Not Currently  Other Topics Concern   Not on file  Social History Narrative   Not on file   Social Determinants of Health   Financial Resource Strain: Not on file  Food Insecurity: Not on file  Transportation Needs: Not on file  Physical Activity: Not on file  Stress: Not on file  Social Connections: Not on file  Intimate Partner Violence: Not on file     Review of Systems  All other systems reviewed and are negative.     Objective:   Physical Exam Constitutional:      General: She is not in acute distress.    Appearance: Normal appearance. She is normal weight. She is not ill-appearing or toxic-appearing.  HENT:     Right Ear: Tympanic membrane and ear canal normal.     Left Ear: Tympanic membrane and ear canal normal.     Nose: Congestion and rhinorrhea present.     Mouth/Throat:     Pharynx: No oropharyngeal exudate or posterior oropharyngeal erythema.  Cardiovascular:     Rate and Rhythm: Normal rate. Rhythm irregular.     Heart sounds: Normal heart sounds.  Pulmonary:     Effort: Pulmonary effort is normal. No respiratory distress.     Breath sounds: Wheezing present. No rhonchi or rales.  Musculoskeletal:        General: No swelling or deformity.     Right lower leg: No edema.     Left lower leg: No edema.  Skin:    Findings: No bruising or  erythema.  Neurological:     General: No focal deficit present.     Mental Status: She is alert and oriented to person, place, and time.     Gait: Gait abnormal.  Psychiatric:        Mood and Affect: Mood normal.        Behavior:  Behavior normal.        Thought Content: Thought content normal.          Assessment & Plan:  Chronic atrial fibrillation ([place]) - Plan: PT with INR/Fingerstick INR is therapeutic.  Make no changes in Coumadin dose.  Recheck INR in 6 to 8 weeks.  I feel the patient likely had an upper respiratory infection that is triggered may be some reactive airway disease or even mild COPD as she was around secondhand smoke for many many decades.  This is causing her to wheeze.  Albuterol is helping with wheezing.  I will try treating postnasal drip and head congestion and Flonase 2 sprays each nostril daily.  If not improving we can even add Xyzal and/or prednisone as a last resort

## 2021-05-02 ENCOUNTER — Other Ambulatory Visit: Payer: Self-pay | Admitting: Family Medicine

## 2021-05-05 ENCOUNTER — Other Ambulatory Visit: Payer: Self-pay | Admitting: Family Medicine

## 2021-05-07 ENCOUNTER — Telehealth: Payer: Self-pay | Admitting: Family Medicine

## 2021-05-07 NOTE — Telephone Encounter (Signed)
Left message for patient's nephew,Bobby, to call back and schedule Medicare Annual Wellness Visit (AWV) in office.   If not able to come in office, please offer to do virtually or by telephone.  Left office number and my jabber [phone].  Due for AWVI  Please schedule at anytime with Nurse Health Advisor.

## 2021-05-20 ENCOUNTER — Other Ambulatory Visit: Payer: Self-pay | Admitting: Family Medicine

## 2021-05-23 ENCOUNTER — Other Ambulatory Visit: Payer: Self-pay

## 2021-05-23 ENCOUNTER — Ambulatory Visit (INDEPENDENT_AMBULATORY_CARE_PROVIDER_SITE_OTHER): Payer: Medicare Other

## 2021-05-23 VITALS — BP 118/64 | HR 72 | Ht 59.0 in | Wt 153.0 lb

## 2021-05-23 DIAGNOSIS — Z0000 Encounter for general adult medical examination without abnormal findings: Secondary | ICD-10-CM | POA: Diagnosis not present

## 2021-05-23 NOTE — Patient Instructions (Signed)
[name] , Thank you for taking time to come for your Medicare Wellness Visit. I appreciate your ongoing commitment to your health goals. Please review the following plan we discussed and let me know if I can assist you in the future.   Screening recommendations/referrals: Colonoscopy: Done 05/24/2013. No longer required. Mammogram: Done 03/29/2021. No longer required. Bone Density: Done 05/29/2019. Repeat every 2 years  Recommended yearly ophthalmology/optometry visit for glaucoma screening and checkup Recommended yearly dental visit for hygiene and checkup  Vaccinations: Influenza vaccine: Done 02/17/2021. Pneumococcal vaccine: Done 09/11/1996 and 09/29/2019 Tdap vaccine: Done 09/11/1996 Repeat in 10 years  Shingles vaccine: Shingrix discussed. Please contact your pharmacy for coverage information.     Covid-19:Done 05/23/2019 and 06/20/2019  Advanced directives: Copy in chart.   Conditions/risks identified: Aim for 30 minutes of exercise or brisk walking each day, drink 6-8 glasses of water and eat lots of fruits and vegetables. KEEP UP THE GOOD WORK!!  Next appointment: Follow up in one year for your annual wellness visit 2024.   Preventive Care 33 Years and Older, Female Preventive care refers to lifestyle choices and visits with your health care provider that can promote health and wellness. What does preventive care include? A yearly physical exam. This is also called an annual well check. Dental exams once or twice a year. Routine eye exams. Ask your health care provider how often you should have your eyes checked. Personal lifestyle choices, including: Daily care of your teeth and gums. Regular physical activity. Eating a healthy diet. Avoiding tobacco and drug use. Limiting alcohol use. Practicing safe sex. Taking low-dose aspirin every day. Taking vitamin and mineral supplements as recommended by your health care provider. What happens during an annual well check? The  services and screenings done by your health care provider during your annual well check will depend on your age, overall health, lifestyle risk factors, and family history of disease. Counseling  Your health care provider may ask you questions about your: Alcohol use. Tobacco use. Drug use. Emotional well-being. Home and relationship well-being. Sexual activity. Eating habits. History of falls. Memory and ability to understand (cognition). Work and work Statistician. Reproductive health. Screening  You may have the following tests or measurements: Height, weight, and BMI. Blood pressure. Lipid and cholesterol levels. These may be checked every 5 years, or more frequently if you are over 66 years old. Skin check. Lung cancer screening. You may have this screening every year starting at age 39 if you have a 30-pack-year history of smoking and currently smoke or have quit within the past 15 years. Fecal occult blood test (FOBT) of the stool. You may have this test every year starting at age 44. Flexible sigmoidoscopy or colonoscopy. You may have a sigmoidoscopy every 5 years or a colonoscopy every 10 years starting at age 74. Hepatitis C blood test. Hepatitis B blood test. Sexually transmitted disease (STD) testing. Diabetes screening. This is done by checking your blood sugar (glucose) after you have not eaten for a while (fasting). You may have this done every 1-3 years. Bone density scan. This is done to screen for osteoporosis. You may have this done starting at age 24. Mammogram. This may be done every 1-2 years. Talk to your health care provider about how often you should have regular mammograms. Talk with your health care provider about your test results, treatment options, and if necessary, the need for more tests. Vaccines  Your health care provider may recommend certain vaccines, such as: Influenza  vaccine. This is recommended every year. Tetanus, diphtheria, and acellular  pertussis (Tdap, Td) vaccine. You may need a Td booster every 10 years. Zoster vaccine. You may need this after age 93. Pneumococcal 13-valent conjugate (PCV13) vaccine. One dose is recommended after age 28. Pneumococcal polysaccharide (PPSV23) vaccine. One dose is recommended after age 40. Talk to your health care provider about which screenings and vaccines you need and how often you need them. This information is not intended to replace advice given to you by your health care provider. Make sure you discuss any questions you have with your health care provider. Document Released: 05/10/2015 Document Revised: 01/01/2016 Document Reviewed: 02/12/2015 Elsevier Interactive Patient Education  2017 [place] Prevention in the Home Falls can cause injuries. They can happen to people of all ages. There are many things you can do to make your home safe and to help prevent falls. What can I do on the outside of my home? Regularly fix the edges of walkways and driveways and fix any cracks. Remove anything that might make you trip as you walk through a door, such as a raised step or threshold. Trim any bushes or trees on the path to your home. Use bright outdoor lighting. Clear any walking paths of anything that might make someone trip, such as rocks or tools. Regularly check to see if handrails are loose or broken. Make sure that both sides of any steps have handrails. Any raised decks and porches should have guardrails on the edges. Have any leaves, snow, or ice cleared regularly. Use sand or salt on walking paths during winter. Clean up any spills in your garage right away. This includes oil or grease spills. What can I do in the bathroom? Use night lights. Install grab bars by the toilet and in the tub and shower. Do not use towel bars as grab bars. Use non-skid mats or decals in the tub or shower. If you need to sit down in the shower, use a plastic, non-slip stool. Keep the floor  dry. Clean up any water that spills on the floor as soon as it happens. Remove soap buildup in the tub or shower regularly. Attach bath mats securely with double-sided non-slip rug tape. Do not have throw rugs and other things on the floor that can make you trip. What can I do in the bedroom? Use night lights. Make sure that you have a light by your bed that is easy to reach. Do not use any sheets or blankets that are too big for your bed. They should not hang down onto the floor. Have a firm chair that has side arms. You can use this for support while you get dressed. Do not have throw rugs and other things on the floor that can make you trip. What can I do in the kitchen? Clean up any spills right away. Avoid walking on wet floors. Keep items that you use a lot in easy-to-reach places. If you need to reach something above you, use a strong step stool that has a grab bar. Keep electrical cords out of the way. Do not use floor polish or wax that makes floors slippery. If you must use wax, use non-skid floor wax. Do not have throw rugs and other things on the floor that can make you trip. What can I do with my stairs? Do not leave any items on the stairs. Make sure that there are handrails on both sides of the stairs and use them. Fix  handrails that are broken or loose. Make sure that handrails are as long as the stairways. Check any carpeting to make sure that it is firmly attached to the stairs. Fix any carpet that is loose or worn. Avoid having throw rugs at the top or bottom of the stairs. If you do have throw rugs, attach them to the floor with carpet tape. Make sure that you have a light switch at the top of the stairs and the bottom of the stairs. If you do not have them, ask someone to add them for you. What else can I do to help prevent falls? Wear shoes that: Do not have high heels. Have rubber bottoms. Are comfortable and fit you well. Are closed at the toe. Do not wear  sandals. If you use a stepladder: Make sure that it is fully opened. Do not climb a closed stepladder. Make sure that both sides of the stepladder are locked into place. Ask someone to hold it for you, if possible. Clearly mark and make sure that you can see: Any grab bars or handrails. First and last steps. Where the edge of each step is. Use tools that help you move around (mobility aids) if they are needed. These include: Canes. Walkers. Scooters. Crutches. Turn on the lights when you go into a dark area. Replace any light bulbs as soon as they burn out. Set up your furniture so you have a clear path. Avoid moving your furniture around. If any of your floors are uneven, fix them. If there are any pets around you, be aware of where they are. Review your medicines with your doctor. Some medicines can make you feel dizzy. This can increase your chance of falling. Ask your doctor what other things that you can do to help prevent falls. This information is not intended to replace advice given to you by your health care provider. Make sure you discuss any questions you have with your health care provider. Document Released: 02/07/2009 Document Revised: 09/19/2015 Document Reviewed: 05/18/2014 Elsevier Interactive Patient Education  2017 [employer].

## 2021-05-23 NOTE — Progress Notes (Signed)
Subjective:   Kathy [name] is a 88 y.o. female who presents for an Initial Medicare Annual Wellness Visit.  Review of Systems     Cardiac Risk Factors include: advanced age (>51men, >41 women);hypertension;dyslipidemia;sedentary lifestyle;obesity (BMI >30kg/m2);Other (see comment), Risk factor comments: Osteopenia, Retinopathy of both eyes.  IN OFFICE VISIT AT [facility].    Objective:    Today's Vitals   05/23/21 1401 05/23/21 1406  BP: 118/64   Pulse: 72   SpO2: 99%   Weight: 153 lb (69.4 kg)   Height: 4\' 11"  (1.499 m)   PainSc:  5    Body mass index is 30.9 kg/m.  Advanced Directives 05/23/2021 12/12/2015 12/12/2015 10/04/2015 07/24/2015 12/11/2014 09/15/2012  Does Patient Have a Medical Advance Directive? Yes No No Yes No No Patient does not have advance directive  Type of Scientist, forensic Power of [employer];Living will - - - - - -  Copy of [place] in Chart? Yes - validated most recent copy scanned in chart (See row information) - - No - copy requested - - -  Would patient like information on creating a medical advance directive? - - - - No - patient declined information - -  Pre-existing out of facility DNR order (yellow form or pink MOST form) - - - - - - No    Current Medications (verified) Outpatient Encounter Medications as of 05/23/2021  Medication Sig   albuterol (VENTOLIN HFA) 108 (90 Base) MCG/ACT inhaler Inhale 2 puffs into the lungs every 6 (six) hours as needed for wheezing or shortness of breath.   Calcium Carbonate-Vitamin D 600-400 MG-UNIT tablet Take 1 tablet by mouth 2 (two) times daily.   cholecalciferol (VITAMIN D) 400 UNITS TABS Take 400 Units by mouth daily. Take in addition to Caltrate with Vitamin D to get 1200 units of Vitamin D   fluticasone (FLONASE) 50 MCG/ACT nasal spray Place 2 sprays into both nostrils daily.   hydrochlorothiazide (HYDRODIURIL) 25 MG tablet Take 1 tablet by mouth once daily   lisinopril (ZESTRIL) 20 MG  tablet Take 1 tablet by mouth once daily   metoprolol tartrate (LOPRESSOR) 50 MG tablet Take 1 tablet by mouth twice daily   oxyCODONE-acetaminophen (PERCOCET) 5-325 MG tablet Take 1 tablet by mouth every 4 (four) hours as needed for severe pain (stop hydrocodone).   simvastatin (ZOCOR) 20 MG tablet TAKE 1 TABLET BY MOUTH AT BEDTIME   Spacer/Aero-Holding Chambers (AEROCHAMBER PLUS FLO-VU W/MASK) MISC 1 each by Does not apply route as needed.   vitamin B-12 (CYANOCOBALAMIN) 1000 MCG tablet Take 1,000 mcg by mouth daily.   warfarin (COUMADIN) 5 MG tablet TAKE 1 TABLET BY MOUTH ON MONDAY, WEDNESDAY FRIDAY, SATURDAY AND 1/2 TABLET BY MOUTH OTHER DAYS   No facility-administered encounter medications on file as of 05/23/2021.    Allergies (verified) Patient has no known allergies.   History: Past Medical History:  Diagnosis Date   Arthritis    both shoulders   Atrial fibrillation (HCC)    Breast cancer ([place])    Cancer (HCC)    left breast   Cataracts, bilateral    Diverticulitis    Diverticulosis    Hemorrhoids    internal   Hiatal hernia    Hyperlipidemia    takes Zocor daily   Hypertension    takes Amlodipine daily   Joint pain    both shoulders   Macular degeneration 02/2003   Nasal congestion    Nocturia    Obesity  Osteoporosis    PONV (postoperative nausea and vomiting)    Prediabetes    Shortness of breath    with exertion   Sinus drainage    Urinary frequency    Urinary incontinence    Past Surgical History:  Procedure Laterality Date   bilateral knee replacements  2008   BREAST BIOPSY  05/04/2011   Procedure: BREAST BIOPSY WITH NEEDLE LOCALIZATION;  Surgeon: Judieth Keens, DO;  Location: [place];  Service: General;  Laterality: Left;   CARDIOVERSION N/A 09/15/2012   Procedure: CARDIOVERSION;  Surgeon: Laverda Page, MD;  Location: [facility];  Service: Cardiovascular;  Laterality: N/A;  h&p in file-HW   COLONOSCOPY     ESOPHAGOGASTRODUODENOSCOPY      HEMORRHOID SURGERY     outpatient "done in doctor's office"   JOINT REPLACEMENT  2008   bilateral knee replacement   left breast biopsy  1984   right breast biopsy  1993   Family History  Problem Relation Age of Onset   Cancer Mother        pancreatic   Cancer Father        esophageal   Cancer Sister        ovarian   Cancer Sister        lung   Cancer Brother        kidney   Cancer Sister        breast cancer   Heart disease Brother    COPD Brother    Cancer Brother        Kidney/Nephrectomy   Anesthesia problems Neg Hx    Hypotension Neg Hx    Malignant hyperthermia Neg Hx    Pseudochol deficiency Neg Hx    Social History   Socioeconomic History   Marital status: Widowed    Spouse name: Not on file   Number of children: 0   Years of education: Not on file   Highest education level: Not on file  Occupational History   Occupation: retired  Tobacco Use   Smoking status: Former    Packs/day: 0.25    Years: 10.00    Pack years: 2.50    Types: Cigarettes    Quit date: 05/21/1971    Years since quitting: 50.0   Smokeless tobacco: Never   Tobacco comments:    quit in the 60's  Substance and Sexual Activity   Alcohol use: No   Drug use: No   Sexual activity: Not Currently  Other Topics Concern   Not on file  Social History Narrative   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    No children but nephew, Mortimer Fries and his wife Juliann Pulse, care for patient.    Lives alone.    Social Determinants of Health   Financial Resource Strain: Low Risk    Difficulty of Paying Living Expenses: Not hard at all  Food Insecurity: No Food Insecurity   Worried About Charity fundraiser in the Last Year: Never true   [place] in the Last Year: Never true  Transportation Needs: No Transportation Needs   Lack of Transportation (Medical): No   Lack of Transportation (Non-Medical): No  Physical Activity: Insufficiently Active   Days of Exercise per Week: 4 days   Minutes of  Exercise per Session: 20 min  Stress: No Stress Concern Present   Feeling of Stress : Not at all  Social Connections: Moderately Integrated   Frequency of Communication with Friends and Family:  More than three times a week   Frequency of Social Gatherings with Friends and Family: More than three times a week   Attends Religious Services: More than 4 times per year   Active Member of Clubs or Organizations: Yes   Attends Archivist Meetings: More than 4 times per year   Marital Status: Widowed    Tobacco Counseling Counseling given: Not Answered Tobacco comments: quit in the 60's   Clinical Intake:  Pre-visit preparation completed: Yes  Pain : 0-10 Pain Score: 5  Pain Type: Chronic pain Pain Location: Shoulder Pain Descriptors / Indicators: Aching Pain Onset: More than a month ago Pain Frequency: Intermittent     BMI - recorded: 30.9 Nutritional Status: BMI > 30  Obese Nutritional Risks: None  How often do you need to have someone help you when you read instructions, pamphlets, or other written materials from your doctor or pharmacy?: 1 - Never  Diabetic?NO  Interpreter Needed?: No  Information entered by :: mj Zohaib Heeney, lpn   Activities of Daily Living In your present state of health, do you have any difficulty performing the following activities: 05/23/2021  Hearing? N  Vision? N  Difficulty concentrating or making decisions? N  Walking or climbing stairs? N  Dressing or bathing? N  Doing errands, shopping? N  Preparing Food and eating ? N  Using the Toilet? N  In the past six months, have you accidently leaked urine? Y  Do you have problems with loss of bowel control? N  Managing your Medications? N  Managing your Finances? N  Housekeeping or managing your Housekeeping? N  Some recent data might be hidden    Patient Care Team: Susy Frizzle, MD as PCP - General (Family Medicine)  Indicate any recent Medical Services you may have received  from other than [facility] providers in the past year (date may be approximate).     Assessment:   This is a routine wellness examination for [place].  Hearing/Vision screen Hearing Screening - Comments:: No hearing issues Vision Screening - Comments:: Glasses. [facility] 2022.  Dietary issues and exercise activities discussed: Current Exercise Habits: Home exercise routine, Type of exercise: Other - see comments (Chair exercises.), Time (Minutes): 20, Frequency (Times/Week): 4, Weekly Exercise (Minutes/Week): 80, Intensity: Mild, Exercise limited by: cardiac condition(s);orthopedic condition(s)   Goals Addressed             This Visit's Progress    Exercise 3x per week (30 min per time)       Continue to stay active.        Depression Screen PHQ 2/9 Scores 05/23/2021 08/12/2020 11/18/2017 03/11/2017 01/28/2017 12/16/2016 06/16/2016  PHQ - 2 Score 0 0 0 0 0 0 0  PHQ- 9 Score - - - 0 0 - -    Fall Risk Fall Risk  05/23/2021 08/12/2020 11/18/2017 12/16/2016 10/04/2015  Falls in the past year? 1 0 No No Yes  Number falls in past yr: 0 - - - 1  Injury with Fall? 0 - - - No  Risk for fall due to : Impaired mobility;History of fall(s);Impaired balance/gait No Fall Risks - - -  Follow up Falls prevention discussed Falls evaluation completed - - -    FALL RISK PREVENTION PERTAINING TO THE HOME:  Any stairs in or around the home? Yes  If so, are there any without handrails? No  Home free of loose throw rugs in walkways, pet beds, electrical cords, etc? Yes  Adequate lighting  in your home to reduce risk of falls? Yes   ASSISTIVE DEVICES UTILIZED TO PREVENT FALLS:  Life alert? Yes  Use of a cane, walker or w/c? Yes  Grab bars in the bathroom? Yes  Shower chair or bench in shower? Yes  Elevated toilet seat or a handicapped toilet? Yes   TIMED UP AND GO:  Was the test performed? Yes .  Length of time to ambulate 10 feet: 12 sec.   Gait steady and fast with assistive  device  Cognitive Function:     6CIT Screen 05/23/2021  What Year? 0 points  What month? 0 points  What time? 0 points  Count back from 20 0 points  Months in reverse 2 points  Repeat phrase 0 points  Total Score 2    Immunizations Immunization History  Administered Date(s) Administered   Fluad Quad(high Dose 65+) 02/07/2019, 01/08/2020, 02/17/2021   Influenza Split 02/09/2012   Influenza, High Dose Seasonal PF 01/28/2017   Influenza,inj,Quad PF,6+ Mos 01/25/2015, 12/27/2015, 12/30/2017   Moderna Sars-Covid-2 Vaccination 05/23/2019, 06/20/2019   Pneumococcal Conjugate-13 09/29/2019   Pneumococcal Polysaccharide-23 09/11/1996   Td 09/11/1996    TDAP status: Due, Education has been provided regarding the importance of this vaccine. Advised may receive this vaccine at local pharmacy or Health Dept. Aware to provide a copy of the vaccination record if obtained from local pharmacy or Health Dept. Verbalized acceptance and understanding.  Flu Vaccine status: Up to date  Pneumococcal vaccine status: Up to date  Covid-19 vaccine status: Completed vaccines  Qualifies for Shingles Vaccine? Yes   Zostavax completed No   Shingrix Completed?: No.    Education has been provided regarding the importance of this vaccine. Patient has been advised to call insurance company to determine out of pocket expense if they have not yet received this vaccine. Advised may also receive vaccine at local pharmacy or Health Dept. Verbalized acceptance and understanding.  Screening Tests Health Maintenance  Topic Date Due   Zoster Vaccines- Shingrix (1 of 2) Never done   TETANUS/TDAP  09/12/2006   COVID-19 Vaccine (3 - Moderna risk series) 07/18/2019   MAMMOGRAM  03/29/2022   Pneumonia Vaccine 77+ Years old  Completed   INFLUENZA VACCINE  Completed   DEXA SCAN  Completed   HPV VACCINES  Aged Out    Health Maintenance  Health Maintenance Due  Topic Date Due   Zoster Vaccines- Shingrix (1 of 2)  Never done   TETANUS/TDAP  09/12/2006   COVID-19 Vaccine (3 - Moderna risk series) 07/18/2019    Colorectal cancer screening: Type of screening: Colonoscopy. Completed 05/24/2013. Repeat every NO LONGER REQUIRED years  Mammogram status: Completed 03/29/2021. Repeat every year  Bone Density status: Completed 05/29/2019. Results reflect: Bone density results: OSTEOPENIA. Repeat every 2 years.  Lung Cancer Screening: (Low Dose CT Chest recommended if Age 34-80 years, 30 pack-year currently smoking OR have quit w/in 15years.) does not qualify.   Additional Screening:  Hepatitis C Screening: does not qualify;  Vision Screening: Recommended annual ophthalmology exams for early detection of glaucoma and other disorders of the eye. Is the patient up to date with their annual eye exam?  Yes  Who is the provider or what is the name of the office in which the patient attends annual eye exams? [facility] If pt is not established with a provider, would they like to be referred to a provider to establish care? No .   Dental Screening: Recommended annual dental exams for proper  oral hygiene  Community Resource Referral / Chronic Care Management: CRR required this visit?  No   CCM required this visit?  No      Plan:     I have personally reviewed and noted the following in the patients chart:   Medical and social history Use of alcohol, tobacco or illicit drugs  Current medications and supplements including opioid prescriptions. Patient is not currently taking opioid prescriptions. Functional ability and status Nutritional status Physical activity Advanced directives List of other physicians Hospitalizations, surgeries, and ER visits in previous 12 months Vitals Screenings to include cognitive, depression, and falls Referrals and appointments  In addition, I have reviewed and discussed with patient certain preventive protocols, quality metrics, and best practice  recommendations. A written personalized care plan for preventive services as well as general preventive health recommendations were provided to patient.     Chriss Driver, LPN   [id number]   Nurse Notes: Pt is up to date with all age appropriate health maintenance. Pt states she would like to schedule bone density after dental procedure. Discussed Shingrix and how to obtain.

## 2021-06-12 ENCOUNTER — Encounter: Payer: Self-pay | Admitting: Family Medicine

## 2021-06-12 ENCOUNTER — Other Ambulatory Visit: Payer: Self-pay

## 2021-06-12 ENCOUNTER — Ambulatory Visit (INDEPENDENT_AMBULATORY_CARE_PROVIDER_SITE_OTHER): Payer: Medicare Other | Admitting: Family Medicine

## 2021-06-12 VITALS — BP 148/88 | HR 88 | Temp 97.7°F | Resp 18 | Ht 59.0 in | Wt 155.0 lb

## 2021-06-12 DIAGNOSIS — R2681 Unsteadiness on feet: Secondary | ICD-10-CM | POA: Diagnosis not present

## 2021-06-12 DIAGNOSIS — I482 Chronic atrial fibrillation, unspecified: Secondary | ICD-10-CM

## 2021-06-12 LAB — PT WITH INR/FINGERSTICK
INR, fingerstick: 1.9 ratio — ABNORMAL HIGH
PT, fingerstick: 22.6 s — ABNORMAL HIGH (ref 10.5–13.1)

## 2021-06-12 NOTE — Progress Notes (Signed)
Subjective:    Patient ID: Kathy [name], female    DOB: 04-Mar-1928, 88 y.o.   MRN: [id number]  HPI Patient continues to report feeling unsteady on both of her feet.  She fell the last time I saw her.  She feels wobbly.  She does not trust her self standing and walking to the bathroom.  She has neuropathy in hands and feet with some numbness and tingling.  She also has an essential tremor.  She denies any fevers or chills or lightheadedness or chest pain or shortness of breath.  Heart rate is controlled today.  INR is subtherapeutic at one-point Past Medical History:  Diagnosis Date   Arthritis    both shoulders   Atrial fibrillation (HCC)    Breast cancer ([place])    Cancer ([place])    left breast   Cataracts, bilateral    Diverticulitis    Diverticulosis    Hemorrhoids    internal   Hiatal hernia    Hyperlipidemia    takes Zocor daily   Hypertension    takes Amlodipine daily   Joint pain    both shoulders   Macular degeneration 02/2003   Nasal congestion    Nocturia    Obesity    Osteoporosis    PONV (postoperative nausea and vomiting)    Prediabetes    Shortness of breath    with exertion   Sinus drainage    Urinary frequency    Urinary incontinence    Past Surgical History:  Procedure Laterality Date   bilateral knee replacements  2008   BREAST BIOPSY  05/04/2011   Procedure: BREAST BIOPSY WITH NEEDLE LOCALIZATION;  Surgeon: Judieth Keens, DO;  Location: [facility];  Service: General;  Laterality: Left;   CARDIOVERSION N/A 09/15/2012   Procedure: CARDIOVERSION;  Surgeon: Laverda Page, MD;  Location: [facility];  Service: Cardiovascular;  Laterality: N/A;  h&p in file-HW   COLONOSCOPY     ESOPHAGOGASTRODUODENOSCOPY     HEMORRHOID SURGERY     outpatient "done in doctor's office"   JOINT REPLACEMENT  2008   bilateral knee replacement   left breast biopsy  1984   right breast biopsy  1993   Current Outpatient Medications on File Prior to Visit  Medication Sig  Dispense Refill   albuterol (VENTOLIN HFA) 108 (90 Base) MCG/ACT inhaler Inhale 2 puffs into the lungs every 6 (six) hours as needed for wheezing or shortness of breath. 8 g 2   Calcium Carbonate-Vitamin D 600-400 MG-UNIT tablet Take 1 tablet by mouth 2 (two) times daily.     cholecalciferol (VITAMIN D) 400 UNITS TABS Take 400 Units by mouth daily. Take in addition to Caltrate with Vitamin D to get 1200 units of Vitamin D     fluticasone (FLONASE) 50 MCG/ACT nasal spray Place 2 sprays into both nostrils daily. 16 g 6   hydrochlorothiazide (HYDRODIURIL) 25 MG tablet Take 1 tablet by mouth once daily 90 tablet 0   lisinopril (ZESTRIL) 20 MG tablet Take 1 tablet by mouth once daily 90 tablet 0   metoprolol tartrate (LOPRESSOR) 50 MG tablet Take 1 tablet by mouth twice daily 180 tablet 0   oxyCODONE-acetaminophen (PERCOCET) 5-325 MG tablet Take 1 tablet by mouth every 4 (four) hours as needed for severe pain (stop hydrocodone). 30 tablet 0   simvastatin (ZOCOR) 20 MG tablet TAKE 1 TABLET BY MOUTH AT BEDTIME 90 tablet 0   Spacer/Aero-Holding Chambers (AEROCHAMBER PLUS FLO-VU W/MASK) MISC 1 each by Does  not apply route as needed. 1 each 1   vitamin B-12 (CYANOCOBALAMIN) 1000 MCG tablet Take 1,000 mcg by mouth daily.     warfarin (COUMADIN) 5 MG tablet TAKE 1 TABLET BY MOUTH ON MONDAY, WEDNESDAY FRIDAY, SATURDAY AND 1/2 TABLET BY MOUTH OTHER DAYS 60 tablet 3   No current facility-administered medications on file prior to visit.   Marland Kitchenall Social History   Socioeconomic History   Marital status: Widowed    Spouse name: Not on file   Number of children: 0   Years of education: Not on file   Highest education level: Not on file  Occupational History   Occupation: retired  Tobacco Use   Smoking status: Former    Packs/day: 0.25    Years: 10.00    Pack years: 2.50    Types: Cigarettes    Quit date: 05/21/1971    Years since quitting: 50.0   Smokeless tobacco: Never   Tobacco comments:    quit in  the 60's  Substance and Sexual Activity   Alcohol use: No   Drug use: No   Sexual activity: Not Currently  Other Topics Concern   Not on file  Social History Narrative   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    No children but nephew, Mortimer Fries and his wife Juliann Pulse, care for patient.    Lives alone.    Social Determinants of Health   Financial Resource Strain: Low Risk    Difficulty of Paying Living Expenses: Not hard at all  Food Insecurity: No Food Insecurity   Worried About Charity fundraiser in the Last Year: Never true   [place] in the Last Year: Never true  Transportation Needs: No Transportation Needs   Lack of Transportation (Medical): No   Lack of Transportation (Non-Medical): No  Physical Activity: Insufficiently Active   Days of Exercise per Week: 4 days   Minutes of Exercise per Session: 20 min  Stress: No Stress Concern Present   Feeling of Stress : Not at all  Social Connections: Moderately Integrated   Frequency of Communication with Friends and Family: More than three times a week   Frequency of Social Gatherings with Friends and Family: More than three times a week   Attends Religious Services: More than 4 times per year   Active Member of [employer] or Organizations: Yes   Attends Archivist Meetings: More than 4 times per year   Marital Status: Widowed  Human resources officer Violence: Not At Risk   Fear of Current or Ex-Partner: No   Emotionally Abused: No   Physically Abused: No   Sexually Abused: No     Review of Systems  All other systems reviewed and are negative.     Objective:   Physical Exam Constitutional:      General: She is not in acute distress.    Appearance: Normal appearance. She is normal weight. She is not ill-appearing or toxic-appearing.  Cardiovascular:     Rate and Rhythm: Normal rate. Rhythm irregular.     Heart sounds: Normal heart sounds.  Pulmonary:     Effort: Pulmonary effort is normal. No respiratory  distress.     Breath sounds: No wheezing, rhonchi or rales.  Musculoskeletal:        General: No swelling or deformity.     Right lower leg: No edema.     Left lower leg: No edema.  Skin:    Findings: No bruising or erythema.  Neurological:     General: No focal deficit present.     Mental Status: She is alert and oriented to person, place, and time.  Psychiatric:        Mood and Affect: Mood normal.        Behavior: Behavior normal.        Thought Content: Thought content normal.          Assessment & Plan:  Chronic atrial fibrillation (HCC) - Plan: PT with INR/Fingerstick  Unsteadiness on feet - Plan: Ambulatory referral to [place] Patient is unsteady on her feet due to a combination of age, muscular deconditioning, and neuropathy.  Consult home health physical therapy to try to improve deconditioning.  INR is acceptable at 1.9.  Recheck 6 weeks.  Continue Coumadin at its current dose.

## 2021-07-11 ENCOUNTER — Telehealth: Payer: Self-pay | Admitting: Family Medicine

## 2021-07-11 NOTE — Telephone Encounter (Signed)
Patient received bill for previous visit. Outstanding amount is $175.00. Patient has same insurance, [facility], with a new policy number of [id number]. Requesting for bill to be resubmitted to insurance for payment.  ? ?Please advise at [phone].  ?

## 2021-07-21 NOTE — Telephone Encounter (Signed)
Patient's niece Juliann Pulse called to follow up status of outstanding bill from [facility] 06/12/21.  ? ?Please advise at [phone]. ?

## 2021-07-30 ENCOUNTER — Other Ambulatory Visit: Payer: Self-pay | Admitting: Family Medicine

## 2021-07-31 NOTE — Telephone Encounter (Signed)
Left vm for Kathy [name] niece of patient per phone intake [phone]. I have reviewed patients account and I see that we had the wrong insurance billed as primary I have fixed the claim and will resubmit. I have left vm for niece. ?

## 2021-08-07 ENCOUNTER — Ambulatory Visit (INDEPENDENT_AMBULATORY_CARE_PROVIDER_SITE_OTHER): Payer: Medicare Other | Admitting: Family Medicine

## 2021-08-07 VITALS — BP 132/72 | HR 77 | Temp 96.9°F | Ht 59.0 in | Wt 159.2 lb

## 2021-08-07 DIAGNOSIS — I482 Chronic atrial fibrillation, unspecified: Secondary | ICD-10-CM | POA: Diagnosis not present

## 2021-08-07 LAB — PT WITH INR/FINGERSTICK
INR, fingerstick: 2.1 ratio — ABNORMAL HIGH
PT, fingerstick: 25.7 s — ABNORMAL HIGH (ref 10.5–13.1)

## 2021-08-07 MED ORDER — METOPROLOL TARTRATE 50 MG PO TABS
50.0000 mg | ORAL_TABLET | Freq: Two times a day (BID) | ORAL | 3 refills | Status: DC
Start: 2021-08-07 — End: 2022-03-31

## 2021-08-07 MED ORDER — LISINOPRIL 20 MG PO TABS
20.0000 mg | ORAL_TABLET | Freq: Every day | ORAL | 1 refills | Status: DC
Start: 2021-08-07 — End: 2021-12-08

## 2021-08-07 MED ORDER — SIMVASTATIN 20 MG PO TABS: 20.0000 mg | ORAL_TABLET | Freq: Every day | ORAL | 3 refills | Status: DC

## 2021-08-07 MED ORDER — HYDROCHLOROTHIAZIDE 25 MG PO TABS: 25.0000 mg | ORAL_TABLET | Freq: Every day | ORAL | 3 refills | Status: DC

## 2021-08-07 NOTE — Progress Notes (Signed)
? ?Subjective:  ? ? Patient ID: Kathy [name], female    DOB: 12/22/1927, 88 y.o.   MRN: [id number] ? ?HPI ?Patient is here today to recheck her Coumadin.  She is currently taking 5 mg on Monday, Wednesday, Friday, Saturday.  She takes 2.5 mg on Tuesday, Thursday, Sunday.  INR is therapeutic today at 2.1.  She denies any chest pain shortness of breath dyspnea on exertion swelling or lightheadedness. ?Past Medical History:  ?Diagnosis Date  ? Arthritis   ? both shoulders  ? Atrial fibrillation ([place])   ? Breast cancer ([place])   ? Cancer [facility])   ? left breast  ? Cataracts, bilateral   ? Diverticulitis   ? Diverticulosis   ? Hemorrhoids   ? internal  ? Hiatal hernia   ? Hyperlipidemia   ? takes Zocor daily  ? Hypertension   ? takes Amlodipine daily  ? Joint pain   ? both shoulders  ? Macular degeneration 02/2003  ? Nasal congestion   ? Nocturia   ? Obesity   ? Osteoporosis   ? PONV (postoperative nausea and vomiting)   ? Prediabetes   ? Shortness of breath   ? with exertion  ? Sinus drainage   ? Urinary frequency   ? Urinary incontinence   ? ?Past Surgical History:  ?Procedure Laterality Date  ? bilateral knee replacements  2008  ? BREAST BIOPSY  05/04/2011  ? Procedure: BREAST BIOPSY WITH NEEDLE LOCALIZATION;  Surgeon: Judieth Keens, DO;  Location: [facility];  Service: General;  Laterality: Left;  ? CARDIOVERSION N/A 09/15/2012  ? Procedure: CARDIOVERSION;  Surgeon: Laverda Page, MD;  Location: [facility] ENDOSCOPY;  Service: Cardiovascular;  Laterality: N/A;  h&p in file-HW  ? COLONOSCOPY    ? ESOPHAGOGASTRODUODENOSCOPY    ? HEMORRHOID SURGERY    ? outpatient "done in doctor's office"  ? JOINT REPLACEMENT  2008  ? bilateral knee replacement  ? left breast biopsy  1984  ? right breast biopsy  1993  ? ?Current Outpatient Medications on File Prior to Visit  ?Medication Sig Dispense Refill  ? Calcium Carbonate-Vitamin D 600-400 MG-UNIT tablet Take 1 tablet by mouth 2 (two) times daily.    ? cholecalciferol (VITAMIN D) 400 UNITS  TABS Take 400 Units by mouth daily. Take in addition to Caltrate with Vitamin D to get 1200 units of Vitamin D    ? fluticasone (FLONASE) 50 MCG/ACT nasal spray Place 2 sprays into both nostrils daily. 16 g 6  ? hydrochlorothiazide (HYDRODIURIL) 25 MG tablet Take 1 tablet by mouth once daily 90 tablet 0  ? lisinopril (ZESTRIL) 20 MG tablet Take 1 tablet by mouth once daily 90 tablet 1  ? metoprolol tartrate (LOPRESSOR) 50 MG tablet Take 1 tablet by mouth twice daily 180 tablet 0  ? oxyCODONE-acetaminophen (PERCOCET) 5-325 MG tablet Take 1 tablet by mouth every 4 (four) hours as needed for severe pain (stop hydrocodone). 30 tablet 0  ? simvastatin (ZOCOR) 20 MG tablet TAKE 1 TABLET BY MOUTH AT BEDTIME 90 tablet 0  ? Spacer/Aero-Holding Chambers (AEROCHAMBER PLUS FLO-VU W/MASK) MISC 1 each by Does not apply route as needed. 1 each 1  ? vitamin B-12 (CYANOCOBALAMIN) 1000 MCG tablet Take 1,000 mcg by mouth daily.    ? warfarin (COUMADIN) 5 MG tablet TAKE 1 TABLET BY MOUTH ON MONDAY, WEDNESDAY FRIDAY, SATURDAY AND 1/2 TABLET BY MOUTH OTHER DAYS 60 tablet 3  ? ?No current facility-administered medications on file prior to visit.  ? ?.all ?  Social History  ? ?Socioeconomic History  ? Marital status: Widowed  ?  Spouse name: Not on file  ? Number of children: 0  ? Years of education: Not on file  ? Highest education level: Not on file  ?Occupational History  ? Occupation: retired  ?Tobacco Use  ? Smoking status: Former  ?  Packs/day: 0.25  ?  Years: 10.00  ?  Pack years: 2.50  ?  Types: Cigarettes  ?  Quit date: 05/21/1971  ?  Years since quitting: 50.2  ? Smokeless tobacco: Never  ? Tobacco comments:  ?  quit in the 60's  ?Substance and Sexual Activity  ? Alcohol use: No  ? Drug use: No  ? Sexual activity: Not Currently  ?Other Topics Concern  ? Not on file  ?Social History Narrative  ? Worked in Charity fundraiser.   ? Widowed since 2014.   ? No children but nephew, Mortimer Fries and his wife Juliann Pulse, care for patient.   ? Lives alone.    ? ?Social Determinants of Health  ? ?Financial Resource Strain: Low Risk   ? Difficulty of Paying Living Expenses: Not hard at all  ?Food Insecurity: No Food Insecurity  ? Worried About Charity fundraiser in the Last Year: Never true  ? Ran Out of Food in the Last Year: Never true  ?Transportation Needs: No Transportation Needs  ? Lack of Transportation (Medical): No  ? Lack of Transportation (Non-Medical): No  ?Physical Activity: Insufficiently Active  ? Days of Exercise per Week: 4 days  ? Minutes of Exercise per Session: 20 min  ?Stress: No Stress Concern Present  ? Feeling of Stress : Not at all  ?Social Connections: Moderately Integrated  ? Frequency of Communication with Friends and Family: More than three times a week  ? Frequency of Social Gatherings with Friends and Family: More than three times a week  ? Attends Religious Services: More than 4 times per year  ? Active Member of Clubs or Organizations: Yes  ? Attends Archivist Meetings: More than 4 times per year  ? Marital Status: Widowed  ?Intimate Partner Violence: Not At Risk  ? Fear of Current or Ex-Partner: No  ? Emotionally Abused: No  ? Physically Abused: No  ? Sexually Abused: No  ? ? ? ?Review of Systems  ?All other systems reviewed and are negative. ? ?   ?Objective:  ? Physical Exam ?Constitutional:   ?   General: She is not in acute distress. ?   Appearance: Normal appearance. She is normal weight. She is not ill-appearing or toxic-appearing.  ?Cardiovascular:  ?   Rate and Rhythm: Normal rate. Rhythm irregular.  ?   Heart sounds: Normal heart sounds.  ?Pulmonary:  ?   Effort: Pulmonary effort is normal. No respiratory distress.  ?   Breath sounds: No wheezing, rhonchi or rales.  ?Musculoskeletal:     ?   General: No swelling or deformity.  ?   Right lower leg: No edema.  ?   Left lower leg: No edema.  ?Skin: ?   Findings: No bruising or erythema.  ?Neurological:  ?   General: No focal deficit present.  ?   Mental Status: She is  alert and oriented to person, place, and time.  ?Psychiatric:     ?   Mood and Affect: Mood normal.     ?   Behavior: Behavior normal.     ?   Thought Content: Thought content normal.  ? ? ? ? ? ?   ?  Assessment & Plan:  ?Chronic atrial fibrillation ([place]) - Plan: PT with INR/Fingerstick ?INR is therapeutic.  No change in Coumadin dosage.  Recheck in 8 weeks. ?

## 2021-10-02 ENCOUNTER — Ambulatory Visit: Payer: Medicare Other | Admitting: Family Medicine

## 2021-10-07 ENCOUNTER — Ambulatory Visit (INDEPENDENT_AMBULATORY_CARE_PROVIDER_SITE_OTHER): Payer: Medicare Other | Admitting: Family Medicine

## 2021-10-07 VITALS — Ht 59.0 in | Wt 159.0 lb

## 2021-10-07 DIAGNOSIS — I482 Chronic atrial fibrillation, unspecified: Secondary | ICD-10-CM | POA: Diagnosis not present

## 2021-10-07 LAB — PT WITH INR/FINGERSTICK
INR, fingerstick: 2.5 ratio — ABNORMAL HIGH
PT, fingerstick: 30.2 s — ABNORMAL HIGH (ref 10.5–13.1)

## 2021-10-07 NOTE — Progress Notes (Signed)
Subjective:    Patient ID: Kathy [name], female    DOB: November 08, 1927, 88 y.o.   MRN: [id number]  HPI Patient is here today to recheck her Coumadin.  She is currently taking 5 mg on Monday, Wednesday, Friday, Saturday.  She takes 2.5 mg on Tuesday, Thursday, Sunday.  INR is therapeutic today at 2.5.  She denies any chest pain shortness of breath dyspnea on exertion swelling or lightheadedness. Past Medical History:  Diagnosis Date   Arthritis    both shoulders   Atrial fibrillation ([place])    Breast cancer ([place])    Cancer ([place])    left breast   Cataracts, bilateral    Diverticulitis    Diverticulosis    Hemorrhoids    internal   Hiatal hernia    Hyperlipidemia    takes Zocor daily   Hypertension    takes Amlodipine daily   Joint pain    both shoulders   Macular degeneration 02/2003   Nasal congestion    Nocturia    Obesity    Osteoporosis    PONV (postoperative nausea and vomiting)    Prediabetes    Shortness of breath    with exertion   Sinus drainage    Urinary frequency    Urinary incontinence    Past Surgical History:  Procedure Laterality Date   bilateral knee replacements  2008   BREAST BIOPSY  05/04/2011   Procedure: BREAST BIOPSY WITH NEEDLE LOCALIZATION;  Surgeon: Judieth Keens, DO;  Location: [facility];  Service: General;  Laterality: Left;   CARDIOVERSION N/A 09/15/2012   Procedure: CARDIOVERSION;  Surgeon: Laverda Page, MD;  Location: [facility];  Service: Cardiovascular;  Laterality: N/A;  h&p in file-HW   COLONOSCOPY     ESOPHAGOGASTRODUODENOSCOPY     HEMORRHOID SURGERY     outpatient "done in doctor's office"   JOINT REPLACEMENT  2008   bilateral knee replacement   left breast biopsy  1984   right breast biopsy  1993   Current Outpatient Medications on File Prior to Visit  Medication Sig Dispense Refill   Calcium Carbonate-Vitamin D 600-400 MG-UNIT tablet Take 1 tablet by mouth 2 (two) times daily.     cholecalciferol (VITAMIN D) 400 UNITS  TABS Take 400 Units by mouth daily. Take in addition to Caltrate with Vitamin D to get 1200 units of Vitamin D     hydrochlorothiazide (HYDRODIURIL) 25 MG tablet Take 1 tablet (25 mg total) by mouth daily. 90 tablet 3   lisinopril (ZESTRIL) 20 MG tablet Take 1 tablet (20 mg total) by mouth daily. 90 tablet 1   metoprolol tartrate (LOPRESSOR) 50 MG tablet Take 1 tablet (50 mg total) by mouth 2 (two) times daily. 180 tablet 3   oxyCODONE-acetaminophen (PERCOCET) 5-325 MG tablet Take 1 tablet by mouth every 4 (four) hours as needed for severe pain (stop hydrocodone). 30 tablet 0   simvastatin (ZOCOR) 20 MG tablet Take 1 tablet (20 mg total) by mouth at bedtime. 90 tablet 3   vitamin B-12 (CYANOCOBALAMIN) 1000 MCG tablet Take 1,000 mcg by mouth daily.     warfarin (COUMADIN) 5 MG tablet TAKE 1 TABLET BY MOUTH ON MONDAY, WEDNESDAY FRIDAY, SATURDAY AND 1/2 TABLET BY MOUTH OTHER DAYS 60 tablet 3   No current facility-administered medications on file prior to visit.   Marland Kitchenall Social History   Socioeconomic History   Marital status: Widowed    Spouse name: Not on file   Number of children: 0  Years of education: Not on file   Highest education level: Not on file  Occupational History   Occupation: retired  Tobacco Use   Smoking status: Former    Packs/day: 0.25    Years: 10.00    Total pack years: 2.50    Types: Cigarettes    Quit date: 05/21/1971    Years since quitting: 50.4   Smokeless tobacco: Never   Tobacco comments:    quit in the 60's  Substance and Sexual Activity   Alcohol use: No   Drug use: No   Sexual activity: Not Currently  Other Topics Concern   Not on file  Social History Narrative   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    No children but nephew, Mortimer Fries and his wife Juliann Pulse, care for patient.    Lives alone.    Social Determinants of Health   Financial Resource Strain: Low Risk  (05/23/2021)   Overall Financial Resource Strain (CARDIA)    Difficulty of Paying  Living Expenses: Not hard at all  Food Insecurity: No Food Insecurity (05/23/2021)   Hunger Vital Sign    Worried About Running Out of Food in the Last Year: Never true    Ran Out of Food in the Last Year: Never true  Transportation Needs: No Transportation Needs (05/23/2021)   PRAPARE - Hydrologist (Medical): No    Lack of Transportation (Non-Medical): No  Physical Activity: Insufficiently Active (05/23/2021)   Exercise Vital Sign    Days of Exercise per Week: 4 days    Minutes of Exercise per Session: 20 min  Stress: No Stress Concern Present (05/23/2021)   [place]    Feeling of Stress : Not at all  Social Connections: Moderately Integrated (05/23/2021)   Social Connection and Isolation Panel [NHANES]    Frequency of Communication with Friends and Family: More than three times a week    Frequency of Social Gatherings with Friends and Family: More than three times a week    Attends Religious Services: More than 4 times per year    Active Member of [employer] or Organizations: Yes    Attends Archivist Meetings: More than 4 times per year    Marital Status: Widowed  Intimate Partner Violence: Not At Risk (05/23/2021)   Humiliation, Afraid, Rape, and Kick questionnaire    Fear of Current or Ex-Partner: No    Emotionally Abused: No    Physically Abused: No    Sexually Abused: No     Review of Systems  All other systems reviewed and are negative.      Objective:   Physical Exam Constitutional:      General: She is not in acute distress.    Appearance: Normal appearance. She is normal weight. She is not ill-appearing or toxic-appearing.  Cardiovascular:     Rate and Rhythm: Normal rate. Rhythm irregular.     Heart sounds: Normal heart sounds.  Pulmonary:     Effort: Pulmonary effort is normal. No respiratory distress.     Breath sounds: No wheezing, rhonchi or rales.   Musculoskeletal:        General: No swelling or deformity.     Right lower leg: No edema.     Left lower leg: No edema.  Skin:    Findings: No bruising or erythema.  Neurological:     General: No focal deficit present.     Mental  Status: She is alert and oriented to person, place, and time.  Psychiatric:        Mood and Affect: Mood normal.        Behavior: Behavior normal.        Thought Content: Thought content normal.           Assessment & Plan:  Chronic atrial fibrillation ([place]) - Plan: PT with INR/Fingerstick INR therapeutic.  Recheck in 8 weeks.

## 2021-12-06 ENCOUNTER — Other Ambulatory Visit: Payer: Self-pay | Admitting: Family Medicine

## 2021-12-08 ENCOUNTER — Ambulatory Visit (INDEPENDENT_AMBULATORY_CARE_PROVIDER_SITE_OTHER): Payer: Medicare Other | Admitting: Family Medicine

## 2021-12-08 VITALS — BP 118/64 | HR 65 | Ht 59.0 in | Wt 161.0 lb

## 2021-12-08 DIAGNOSIS — I482 Chronic atrial fibrillation, unspecified: Secondary | ICD-10-CM | POA: Diagnosis not present

## 2021-12-08 DIAGNOSIS — M25511 Pain in right shoulder: Secondary | ICD-10-CM

## 2021-12-08 LAB — PT WITH INR/FINGERSTICK
INR, fingerstick: 2.3 ratio — ABNORMAL HIGH
PT, fingerstick: 27.4 s — ABNORMAL HIGH (ref 10.5–13.1)

## 2021-12-08 MED ORDER — OXYCODONE-ACETAMINOPHEN 5-325 MG PO TABS: 1.0000 | ORAL_TABLET | ORAL | 0 refills | Status: AC | PRN

## 2021-12-08 NOTE — Telephone Encounter (Signed)
Requested Prescriptions  Pending Prescriptions Disp Refills  . lisinopril (ZESTRIL) 20 MG tablet [Pharmacy Med Name: Lisinopril 20 MG Oral Tablet] 90 tablet 0    Sig: TAKE 1 TABLET BY MOUTH DAILY     Cardiovascular:  ACE Inhibitors Failed - 12/06/2021 10:25 PM      Failed - Cr in normal range and within 180 days    Creatinine  Date Value Ref Range Status  12/05/2015 0.9 0.6 - 1.1 mg/dL Final   Creat  Date Value Ref Range Status  04/14/2021 1.05 (H) 0.60 - 0.95 mg/dL Final         Failed - K in normal range and within 180 days    Potassium  Date Value Ref Range Status  04/14/2021 4.3 3.5 - 5.3 mmol/L Final  12/05/2015 4.2 3.5 - 5.1 mEq/L Final         Passed - Patient is not pregnant      Passed - Last BP in normal range    BP Readings from Last 1 Encounters:  08/07/21 132/72         Passed - Valid encounter within last 6 months    Recent Outpatient Visits          4 months ago Chronic atrial fibrillation ([place])   [place] Susy Frizzle, MD   5 months ago Chronic atrial fibrillation ([place])   [place] Susy Frizzle, MD   7 months ago Chronic atrial fibrillation [facility])   [place] Pickard, Cammie Mcgee, MD   7 months ago Confusion   [place] [facility] Schaumann, Cammie Mcgee, MD   9 months ago Chronic atrial fibrillation [facility])   [place], Cammie Mcgee, MD      Future Appointments            Today [place], Cammie Mcgee, MD [place], [place]

## 2021-12-08 NOTE — Progress Notes (Signed)
Subjective:    Patient ID: Kathy [name], female    DOB: April 28, 1927, 88 y.o.   MRN: [id number]  HPI Patient is here today to recheck her Coumadin.  She is currently taking 5 mg on Monday, Wednesday, Friday, Saturday.  She takes 2.5 mg on Tuesday, Thursday, Sunday.  INR is therapeutic today at 2.3.  She denies any chest pain shortness of breath dyspnea on exertion swelling or lightheadedness. Past Medical History:  Diagnosis Date   Arthritis    both shoulders   Atrial fibrillation ([place])    Breast cancer ([place])    Cancer ([place])    left breast   Cataracts, bilateral    Diverticulitis    Diverticulosis    Hemorrhoids    internal   Hiatal hernia    Hyperlipidemia    takes Zocor daily   Hypertension    takes Amlodipine daily   Joint pain    both shoulders   Macular degeneration 02/2003   Nasal congestion    Nocturia    Obesity    Osteoporosis    PONV (postoperative nausea and vomiting)    Prediabetes    Shortness of breath    with exertion   Sinus drainage    Urinary frequency    Urinary incontinence    Past Surgical History:  Procedure Laterality Date   bilateral knee replacements  2008   BREAST BIOPSY  05/04/2011   Procedure: BREAST BIOPSY WITH NEEDLE LOCALIZATION;  Surgeon: Judieth Keens, DO;  Location: [facility];  Service: General;  Laterality: Left;   CARDIOVERSION N/A 09/15/2012   Procedure: CARDIOVERSION;  Surgeon: Laverda Page, MD;  Location: [facility];  Service: Cardiovascular;  Laterality: N/A;  h&p in file-HW   COLONOSCOPY     ESOPHAGOGASTRODUODENOSCOPY     HEMORRHOID SURGERY     outpatient "done in doctor's office"   JOINT REPLACEMENT  2008   bilateral knee replacement   left breast biopsy  1984   right breast biopsy  1993   Current Outpatient Medications on File Prior to Visit  Medication Sig Dispense Refill   Calcium Carbonate-Vitamin D 600-400 MG-UNIT tablet Take 1 tablet by mouth 2 (two) times daily.     cholecalciferol (VITAMIN D) 400 UNITS  TABS Take 400 Units by mouth daily. Take in addition to Caltrate with Vitamin D to get 1200 units of Vitamin D     hydrochlorothiazide (HYDRODIURIL) 25 MG tablet Take 1 tablet (25 mg total) by mouth daily. 90 tablet 3   lisinopril (ZESTRIL) 20 MG tablet TAKE 1 TABLET BY MOUTH DAILY 90 tablet 0   metoprolol tartrate (LOPRESSOR) 50 MG tablet Take 1 tablet (50 mg total) by mouth 2 (two) times daily. 180 tablet 3   oxyCODONE-acetaminophen (PERCOCET) 5-325 MG tablet Take 1 tablet by mouth every 4 (four) hours as needed for severe pain (stop hydrocodone). 30 tablet 0   simvastatin (ZOCOR) 20 MG tablet Take 1 tablet (20 mg total) by mouth at bedtime. 90 tablet 3   vitamin B-12 (CYANOCOBALAMIN) 1000 MCG tablet Take 1,000 mcg by mouth daily.     warfarin (COUMADIN) 5 MG tablet TAKE 1 TABLET BY MOUTH ON MONDAY, WEDNESDAY FRIDAY, SATURDAY AND 1/2 TABLET BY MOUTH OTHER DAYS 60 tablet 3   No current facility-administered medications on file prior to visit.   Marland Kitchenall Social History   Socioeconomic History   Marital status: Widowed    Spouse name: Not on file   Number of children: 0   Years of education:  Not on file   Highest education level: Not on file  Occupational History   Occupation: retired  Tobacco Use   Smoking status: Former    Packs/day: 0.25    Years: 10.00    Total pack years: 2.50    Types: Cigarettes    Quit date: 05/21/1971    Years since quitting: 50.5   Smokeless tobacco: Never   Tobacco comments:    quit in the 60's  Substance and Sexual Activity   Alcohol use: No   Drug use: No   Sexual activity: Not Currently  Other Topics Concern   Not on file  Social History Narrative   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    No children but nephew, Mortimer Fries and his wife Juliann Pulse, care for patient.    Lives alone.    Social Determinants of Health   Financial Resource Strain: Low Risk  (05/23/2021)   Overall Financial Resource Strain (CARDIA)    Difficulty of Paying Living Expenses:  Not hard at all  Food Insecurity: No Food Insecurity (05/23/2021)   Hunger Vital Sign    Worried About Running Out of Food in the Last Year: Never true    Ran Out of Food in the Last Year: Never true  Transportation Needs: No Transportation Needs (05/23/2021)   PRAPARE - Hydrologist (Medical): No    Lack of Transportation (Non-Medical): No  Physical Activity: Insufficiently Active (05/23/2021)   Exercise Vital Sign    Days of Exercise per Week: 4 days    Minutes of Exercise per Session: 20 min  Stress: No Stress Concern Present (05/23/2021)   [place]    Feeling of Stress : Not at all  Social Connections: Moderately Integrated (05/23/2021)   Social Connection and Isolation Panel [NHANES]    Frequency of Communication with Friends and Family: More than three times a week    Frequency of Social Gatherings with Friends and Family: More than three times a week    Attends Religious Services: More than 4 times per year    Active Member of [employer] or Organizations: Yes    Attends Archivist Meetings: More than 4 times per year    Marital Status: Widowed  Intimate Partner Violence: Not At Risk (05/23/2021)   Humiliation, Afraid, Rape, and Kick questionnaire    Fear of Current or Ex-Partner: No    Emotionally Abused: No    Physically Abused: No    Sexually Abused: No     Review of Systems  All other systems reviewed and are negative.      Objective:   Physical Exam Constitutional:      General: She is not in acute distress.    Appearance: Normal appearance. She is normal weight. She is not ill-appearing or toxic-appearing.  Cardiovascular:     Rate and Rhythm: Normal rate. Rhythm irregular.     Heart sounds: Normal heart sounds.  Pulmonary:     Effort: Pulmonary effort is normal. No respiratory distress.     Breath sounds: No wheezing, rhonchi or rales.  Musculoskeletal:         General: No swelling or deformity.     Right lower leg: No edema.     Left lower leg: No edema.  Skin:    Findings: No bruising or erythema.  Neurological:     General: No focal deficit present.     Mental Status: She is  alert and oriented to person, place, and time.  Psychiatric:        Mood and Affect: Mood normal.        Behavior: Behavior normal.        Thought Content: Thought content normal.           Assessment & Plan:  Chronic atrial fibrillation (HCC) - Plan: PT with INR/Fingerstick  Acute pain of right shoulder - Plan: oxyCODONE-acetaminophen (PERCOCET) 5-325 MG tablet  INR therapeutic.  Recheck in 8 weeks.   I also refilled her pain medication that she uses sparingly for shoulder pain.  (30 pills often least 6+ months).

## 2021-12-12 ENCOUNTER — Telehealth: Payer: Self-pay

## 2021-12-12 NOTE — Telephone Encounter (Addendum)
[facility] called stated that the med Warfarin is being provided by a diff med company. Pharmacy just want to let you if can switch to something else, [facility] will fax over an alternative.

## 2022-01-05 DIAGNOSIS — H52223 Regular astigmatism, bilateral: Secondary | ICD-10-CM | POA: Diagnosis not present

## 2022-01-05 DIAGNOSIS — H353131 Nonexudative age-related macular degeneration, bilateral, early dry stage: Secondary | ICD-10-CM | POA: Diagnosis not present

## 2022-01-05 DIAGNOSIS — Z9841 Cataract extraction status, right eye: Secondary | ICD-10-CM | POA: Diagnosis not present

## 2022-01-05 DIAGNOSIS — Z9842 Cataract extraction status, left eye: Secondary | ICD-10-CM | POA: Diagnosis not present

## 2022-02-09 ENCOUNTER — Ambulatory Visit (INDEPENDENT_AMBULATORY_CARE_PROVIDER_SITE_OTHER): Payer: Medicare Other | Admitting: Family Medicine

## 2022-02-09 VITALS — BP 118/72 | HR 57 | Ht 59.0 in | Wt 161.2 lb

## 2022-02-09 DIAGNOSIS — Z23 Encounter for immunization: Secondary | ICD-10-CM | POA: Diagnosis not present

## 2022-02-09 DIAGNOSIS — I482 Chronic atrial fibrillation, unspecified: Secondary | ICD-10-CM | POA: Diagnosis not present

## 2022-02-09 LAB — PT WITH INR/FINGERSTICK
INR, fingerstick: 2.9 ratio — ABNORMAL HIGH
PT, fingerstick: 34.4 s — ABNORMAL HIGH (ref 10.5–13.1)

## 2022-02-09 NOTE — Addendum Note (Signed)
Addended by: Randal Buba K on: 02/09/2022 02:31 PM   Modules accepted: Orders

## 2022-02-09 NOTE — Progress Notes (Signed)
Subjective:    Patient ID: Kathy [name], female    DOB: 11-05-1927, 88 y.o.   MRN: [id number]  HPI Patient is here today to recheck her Coumadin.  She is currently taking 5 mg on Monday, Wednesday, Friday, Saturday.  She takes 2.5 mg on Tuesday, Thursday, Sunday.  INR is therapeutic today at 2.9.  She denies any chest pain shortness of breath dyspnea on exertion swelling or lightheadedness. Past Medical History:  Diagnosis Date   Arthritis    both shoulders   Atrial fibrillation ([place])    Breast cancer ([place])    Cancer ([place])    left breast   Cataracts, bilateral    Diverticulitis    Diverticulosis    Hemorrhoids    internal   Hiatal hernia    Hyperlipidemia    takes Zocor daily   Hypertension    takes Amlodipine daily   Joint pain    both shoulders   Macular degeneration 02/2003   Nasal congestion    Nocturia    Obesity    Osteoporosis    PONV (postoperative nausea and vomiting)    Prediabetes    Shortness of breath    with exertion   Sinus drainage    Urinary frequency    Urinary incontinence    Past Surgical History:  Procedure Laterality Date   bilateral knee replacements  2008   BREAST BIOPSY  05/04/2011   Procedure: BREAST BIOPSY WITH NEEDLE LOCALIZATION;  Surgeon: Judieth Keens, DO;  Location: [facility];  Service: General;  Laterality: Left;   CARDIOVERSION N/A 09/15/2012   Procedure: CARDIOVERSION;  Surgeon: Laverda Page, MD;  Location: [facility];  Service: Cardiovascular;  Laterality: N/A;  h&p in file-HW   COLONOSCOPY     ESOPHAGOGASTRODUODENOSCOPY     HEMORRHOID SURGERY     outpatient "done in doctor's office"   JOINT REPLACEMENT  2008   bilateral knee replacement   left breast biopsy  1984   right breast biopsy  1993   Current Outpatient Medications on File Prior to Visit  Medication Sig Dispense Refill   Calcium Carbonate-Vitamin D 600-400 MG-UNIT tablet Take 1 tablet by mouth 2 (two) times daily.     cholecalciferol (VITAMIN D) 400  UNITS TABS Take 400 Units by mouth daily. Take in addition to Caltrate with Vitamin D to get 1200 units of Vitamin D     hydrochlorothiazide (HYDRODIURIL) 25 MG tablet Take 1 tablet (25 mg total) by mouth daily. 90 tablet 3   lisinopril (ZESTRIL) 20 MG tablet TAKE 1 TABLET BY MOUTH DAILY 90 tablet 0   metoprolol tartrate (LOPRESSOR) 50 MG tablet Take 1 tablet (50 mg total) by mouth 2 (two) times daily. 180 tablet 3   oxyCODONE-acetaminophen (PERCOCET) 5-325 MG tablet Take 1 tablet by mouth every 4 (four) hours as needed for severe pain (stop hydrocodone). 30 tablet 0   simvastatin (ZOCOR) 20 MG tablet Take 1 tablet (20 mg total) by mouth at bedtime. 90 tablet 3   vitamin B-12 (CYANOCOBALAMIN) 1000 MCG tablet Take 1,000 mcg by mouth daily.     warfarin (COUMADIN) 5 MG tablet TAKE 1 TABLET BY MOUTH ON MONDAY, WEDNESDAY FRIDAY, SATURDAY AND 1/2 TABLET BY MOUTH OTHER DAYS 60 tablet 3   No current facility-administered medications on file prior to visit.   Marland Kitchenall Social History   Socioeconomic History   Marital status: Widowed    Spouse name: Not on file   Number of children: 0   Years  of education: Not on file   Highest education level: Not on file  Occupational History   Occupation: retired  Tobacco Use   Smoking status: Former    Packs/day: 0.25    Years: 10.00    Total pack years: 2.50    Types: Cigarettes    Quit date: 05/21/1971    Years since quitting: 50.7   Smokeless tobacco: Never   Tobacco comments:    quit in the 60's  Substance and Sexual Activity   Alcohol use: No   Drug use: No   Sexual activity: Not Currently  Other Topics Concern   Not on file  Social History Narrative   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    No children but nephew, Mortimer Fries and his wife Juliann Pulse, care for patient.    Lives alone.    Social Determinants of Health   Financial Resource Strain: Low Risk  (05/23/2021)   Overall Financial Resource Strain (CARDIA)    Difficulty of Paying Living  Expenses: Not hard at all  Food Insecurity: No Food Insecurity (05/23/2021)   Hunger Vital Sign    Worried About Running Out of Food in the Last Year: Never true    Ran Out of Food in the Last Year: Never true  Transportation Needs: No Transportation Needs (05/23/2021)   PRAPARE - Hydrologist (Medical): No    Lack of Transportation (Non-Medical): No  Physical Activity: Insufficiently Active (05/23/2021)   Exercise Vital Sign    Days of Exercise per Week: 4 days    Minutes of Exercise per Session: 20 min  Stress: No Stress Concern Present (05/23/2021)   [place]    Feeling of Stress : Not at all  Social Connections: Moderately Integrated (05/23/2021)   Social Connection and Isolation Panel [NHANES]    Frequency of Communication with Friends and Family: More than three times a week    Frequency of Social Gatherings with Friends and Family: More than three times a week    Attends Religious Services: More than 4 times per year    Active Member of [employer] or Organizations: Yes    Attends Archivist Meetings: More than 4 times per year    Marital Status: Widowed  Intimate Partner Violence: Not At Risk (05/23/2021)   Humiliation, Afraid, Rape, and Kick questionnaire    Fear of Current or Ex-Partner: No    Emotionally Abused: No    Physically Abused: No    Sexually Abused: No     Review of Systems  All other systems reviewed and are negative.      Objective:   Physical Exam Constitutional:      General: She is not in acute distress.    Appearance: Normal appearance. She is normal weight. She is not ill-appearing or toxic-appearing.  Cardiovascular:     Rate and Rhythm: Normal rate. Rhythm irregular.     Heart sounds: Normal heart sounds.  Pulmonary:     Effort: Pulmonary effort is normal. No respiratory distress.     Breath sounds: No wheezing, rhonchi or rales.   Musculoskeletal:        General: No swelling or deformity.     Right lower leg: No edema.     Left lower leg: No edema.  Skin:    Findings: No bruising or erythema.  Neurological:     General: No focal deficit present.     Mental Status:  She is alert and oriented to person, place, and time.  Psychiatric:        Mood and Affect: Mood normal.        Behavior: Behavior normal.        Thought Content: Thought content normal.           Assessment & Plan:  Chronic atrial fibrillation ([place]) - Plan: PT with INR/Fingerstick  INR therapeutic.  Recheck in 8 weeks.   She also received her flu shot today

## 2022-02-23 ENCOUNTER — Other Ambulatory Visit: Payer: Self-pay | Admitting: Family Medicine

## 2022-03-01 ENCOUNTER — Other Ambulatory Visit: Payer: Self-pay | Admitting: Family Medicine

## 2022-03-02 NOTE — Telephone Encounter (Signed)
Requested medication (s) are due for refill today: yes  Requested medication (s) are on the active medication list: yes  Last refill:  12/08/21 #90   Future visit scheduled: yes  Notes to clinic:  overdue lab work   Requested Prescriptions  Pending Prescriptions Disp Refills   lisinopril (ZESTRIL) 20 MG tablet [Pharmacy Med Name: Lisinopril 20 MG Oral Tablet] 90 tablet 3    Sig: TAKE 1 TABLET BY MOUTH DAILY     Cardiovascular:  ACE Inhibitors Failed - 03/01/2022 11:02 PM      Failed - Cr in normal range and within 180 days    Creatinine  Date Value Ref Range Status  12/05/2015 0.9 0.6 - 1.1 mg/dL Final   Creat  Date Value Ref Range Status  04/14/2021 1.05 (H) 0.60 - 0.95 mg/dL Final         Failed - K in normal range and within 180 days    Potassium  Date Value Ref Range Status  04/14/2021 4.3 3.5 - 5.3 mmol/L Final  12/05/2015 4.2 3.5 - 5.1 mEq/L Final         Failed - Valid encounter within last 6 months    Recent Outpatient Visits           6 months ago Chronic atrial fibrillation ([place])   [facility] Susy Frizzle, MD   8 months ago Chronic atrial fibrillation ([place])   [place] Susy Frizzle, MD   10 months ago Chronic atrial fibrillation [facility])   [place] Pickard, Cammie Mcgee, MD   10 months ago Confusion   [facility] Pickard, Cammie Mcgee, MD   1 year ago Chronic atrial fibrillation [facility])   [place] Pickard, Cammie Mcgee, MD       Future Appointments             In 1 month Pickard, Cammie Mcgee, MD [place], [place] - Patient is not pregnant      Passed - Last BP in normal range    BP Readings from Last 1 Encounters:  02/09/22 118/72

## 2022-03-30 DIAGNOSIS — Z1231 Encounter for screening mammogram for malignant neoplasm of breast: Secondary | ICD-10-CM | POA: Diagnosis not present

## 2022-03-30 LAB — HM MAMMOGRAPHY

## 2022-03-31 ENCOUNTER — Other Ambulatory Visit: Payer: Self-pay | Admitting: Family Medicine

## 2022-04-13 ENCOUNTER — Encounter: Payer: Self-pay | Admitting: Family Medicine

## 2022-04-13 ENCOUNTER — Ambulatory Visit (INDEPENDENT_AMBULATORY_CARE_PROVIDER_SITE_OTHER): Payer: Medicare Other | Admitting: Family Medicine

## 2022-04-13 VITALS — BP 118/68 | HR 89 | Ht 59.0 in | Wt 161.2 lb

## 2022-04-13 DIAGNOSIS — I482 Chronic atrial fibrillation, unspecified: Secondary | ICD-10-CM | POA: Diagnosis not present

## 2022-04-13 DIAGNOSIS — R5383 Other fatigue: Secondary | ICD-10-CM

## 2022-04-13 LAB — PT WITH INR/FINGERSTICK
INR, fingerstick: 2 ratio — ABNORMAL HIGH
PT, fingerstick: 23.6 s — ABNORMAL HIGH (ref 10.5–13.1)

## 2022-04-13 NOTE — Progress Notes (Signed)
Subjective:    Patient ID: Kathy [name], female    DOB: Feb 27, 1928, 88 y.o.   MRN: [id number]  HPI Patient is here today to recheck her Coumadin.  She is currently taking 5 mg on Monday, Wednesday, Friday, Saturday.  She takes 2.5 mg on Tuesday, Thursday, Sunday.  INR is therapeutic today at 2.9.    She does report feeling fatigue.  She believes that she has been more busy over the last week and this is causing it.  She denies any chest pain.  She denies any shortness of breath.  She denies dyspnea on exertion.  She denies any abdominal pain.  She denies any melena or hematochezia.  She denies any dysuria.  She denies any cough fever or chills. Past Medical History:  Diagnosis Date   Arthritis    both shoulders   Atrial fibrillation ([place])    Breast cancer ([place])    Cancer ([place])    left breast   Cataracts, bilateral    Diverticulitis    Diverticulosis    Hemorrhoids    internal   Hiatal hernia    Hyperlipidemia    takes Zocor daily   Hypertension    takes Amlodipine daily   Joint pain    both shoulders   Macular degeneration 02/2003   Nasal congestion    Nocturia    Obesity    Osteoporosis    PONV (postoperative nausea and vomiting)    Prediabetes    Shortness of breath    with exertion   Sinus drainage    Urinary frequency    Urinary incontinence    Past Surgical History:  Procedure Laterality Date   bilateral knee replacements  2008   BREAST BIOPSY  05/04/2011   Procedure: BREAST BIOPSY WITH NEEDLE LOCALIZATION;  Surgeon: Judieth Keens, DO;  Location: [facility];  Service: General;  Laterality: Left;   CARDIOVERSION N/A 09/15/2012   Procedure: CARDIOVERSION;  Surgeon: Laverda Page, MD;  Location: [facility];  Service: Cardiovascular;  Laterality: N/A;  h&p in file-HW   COLONOSCOPY     ESOPHAGOGASTRODUODENOSCOPY     HEMORRHOID SURGERY     outpatient "done in doctor's office"   JOINT REPLACEMENT  2008   bilateral knee replacement   left breast biopsy   1984   right breast biopsy  1993   Current Outpatient Medications on File Prior to Visit  Medication Sig Dispense Refill   Calcium Carbonate-Vitamin D 600-400 MG-UNIT tablet Take 1 tablet by mouth 2 (two) times daily.     cholecalciferol (VITAMIN D) 400 UNITS TABS Take 400 Units by mouth daily. Take in addition to Caltrate with Vitamin D to get 1200 units of Vitamin D     hydrochlorothiazide (HYDRODIURIL) 25 MG tablet TAKE 1 TABLET BY MOUTH DAILY 100 tablet 2   lisinopril (ZESTRIL) 20 MG tablet TAKE 1 TABLET BY MOUTH DAILY 90 tablet 0   metoprolol tartrate (LOPRESSOR) 50 MG tablet TAKE 1 TABLET BY MOUTH TWICE  DAILY 200 tablet 2   oxyCODONE-acetaminophen (PERCOCET) 5-325 MG tablet Take 1 tablet by mouth every 4 (four) hours as needed for severe pain (stop hydrocodone). 30 tablet 0   simvastatin (ZOCOR) 20 MG tablet TAKE 1 TABLET BY MOUTH AT  BEDTIME 100 tablet 2   vitamin B-12 (CYANOCOBALAMIN) 1000 MCG tablet Take 1,000 mcg by mouth daily.     warfarin (COUMADIN) 5 MG tablet TAKE ONE TABLET BY MOUTH ON MONDAY, WEDNESDAY, FRIDAY, SATURDAY AND ONE-HALF TABLET BY MOUTH  OTHER DAYS. 60 tablet 0   No current facility-administered medications on file prior to visit.   Marland Kitchenall Social History   Socioeconomic History   Marital status: Widowed    Spouse name: Not on file   Number of children: 0   Years of education: Not on file   Highest education level: Not on file  Occupational History   Occupation: retired  Tobacco Use   Smoking status: Former    Packs/day: 0.25    Years: 10.00    Total pack years: 2.50    Types: Cigarettes    Quit date: 05/21/1971    Years since quitting: 50.9   Smokeless tobacco: Never   Tobacco comments:    quit in the 60's  Substance and Sexual Activity   Alcohol use: No   Drug use: No   Sexual activity: Not Currently  Other Topics Concern   Not on file  Social History Narrative   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    No children but nephew, Mortimer Fries and  his wife Juliann Pulse, care for patient.    Lives alone.    Social Determinants of Health   Financial Resource Strain: Low Risk  (05/23/2021)   Overall Financial Resource Strain (CARDIA)    Difficulty of Paying Living Expenses: Not hard at all  Food Insecurity: No Food Insecurity (05/23/2021)   Hunger Vital Sign    Worried About Running Out of Food in the Last Year: Never true    Ran Out of Food in the Last Year: Never true  Transportation Needs: No Transportation Needs (05/23/2021)   PRAPARE - Hydrologist (Medical): No    Lack of Transportation (Non-Medical): No  Physical Activity: Insufficiently Active (05/23/2021)   Exercise Vital Sign    Days of Exercise per Week: 4 days    Minutes of Exercise per Session: 20 min  Stress: No Stress Concern Present (05/23/2021)   [place]    Feeling of Stress : Not at all  Social Connections: Moderately Integrated (05/23/2021)   Social Connection and Isolation Panel [NHANES]    Frequency of Communication with Friends and Family: More than three times a week    Frequency of Social Gatherings with Friends and Family: More than three times a week    Attends Religious Services: More than 4 times per year    Active Member of [employer] or Organizations: Yes    Attends Archivist Meetings: More than 4 times per year    Marital Status: Widowed  Intimate Partner Violence: Not At Risk (05/23/2021)   Humiliation, Afraid, Rape, and Kick questionnaire    Fear of Current or Ex-Partner: No    Emotionally Abused: No    Physically Abused: No    Sexually Abused: No     Review of Systems  All other systems reviewed and are negative.      Objective:   Physical Exam Constitutional:      General: She is not in acute distress.    Appearance: Normal appearance. She is normal weight. She is not ill-appearing or toxic-appearing.  Cardiovascular:     Rate and Rhythm:  Normal rate. Rhythm irregular.     Heart sounds: Normal heart sounds.  Pulmonary:     Effort: Pulmonary effort is normal. No respiratory distress.     Breath sounds: No wheezing, rhonchi or rales.  Musculoskeletal:        General: No swelling  or deformity.     Right lower leg: No edema.     Left lower leg: No edema.  Skin:    Findings: No bruising or erythema.  Neurological:     General: No focal deficit present.     Mental Status: She is alert and oriented to person, place, and time.  Psychiatric:        Mood and Affect: Mood normal.        Behavior: Behavior normal.        Thought Content: Thought content normal.          Assessment & Plan:  Chronic atrial fibrillation ([place]) - Plan: PT with INR/Fingerstick  Fatigue, unspecified type - Plan: CBC with Differential/Platelet, COMPLETE METABOLIC PANEL WITH GFR, TSH  INR therapeutic.  Recheck in 8 weeks.   Patient's exam today is unremarkable aside from atrial fibrillation Wt Readings from Last 3 Encounters:  04/13/22 161 lb 3.2 oz (73.1 kg)  02/09/22 161 lb 3.2 oz (73.1 kg)  12/08/21 161 lb (73 kg)   Her weights are stable and her review of systems is negative other than fatigue.  I will check a CBC a CMP and a TSH to evaluate for any metabolic causes of fatigue.

## 2022-04-14 LAB — CBC WITH DIFFERENTIAL/PLATELET
Absolute Monocytes: 525 cells/uL (ref 200–950)
Basophils Absolute: 47 cells/uL (ref 0–200)
Basophils Relative: 0.8 %
Eosinophils Absolute: 159 cells/uL (ref 15–500)
Eosinophils Relative: 2.7 %
HCT: 36.2 % (ref 35.0–45.0)
Hemoglobin: 11.9 g/dL (ref 11.7–15.5)
Lymphs Abs: 2749 cells/uL (ref 850–3900)
MCH: 33 pg (ref 27.0–33.0)
MCHC: 32.9 g/dL (ref 32.0–36.0)
MCV: 100.3 fL — ABNORMAL HIGH (ref 80.0–100.0)
MPV: 10.7 fL (ref 7.5–12.5)
Monocytes Relative: 8.9 %
Neutro Abs: 2419 cells/uL (ref 1500–7800)
Neutrophils Relative %: 41 %
Platelets: 184 10*3/uL (ref 140–400)
RBC: 3.61 10*6/uL — ABNORMAL LOW (ref 3.80–5.10)
RDW: 13.1 % (ref 11.0–15.0)
Total Lymphocyte: 46.6 %
WBC: 5.9 10*3/uL (ref 3.8–10.8)

## 2022-04-14 LAB — COMPLETE METABOLIC PANEL WITH GFR
AG Ratio: 2.1 (calc) (ref 1.0–2.5)
ALT: 9 U/L (ref 6–29)
AST: 13 U/L (ref 10–35)
Albumin: 4.2 g/dL (ref 3.6–5.1)
Alkaline phosphatase (APISO): 48 U/L (ref 37–153)
BUN/Creatinine Ratio: 23 (calc) — ABNORMAL HIGH (ref 6–22)
BUN: 23 mg/dL (ref 7–25)
CO2: 26 mmol/L (ref 20–32)
Calcium: 9.5 mg/dL (ref 8.6–10.4)
Chloride: 105 mmol/L (ref 98–110)
Creat: 0.99 mg/dL — ABNORMAL HIGH (ref 0.60–0.95)
Globulin: 2 g/dL (calc) (ref 1.9–3.7)
Glucose, Bld: 86 mg/dL (ref 65–99)
Potassium: 4.5 mmol/L (ref 3.5–5.3)
Sodium: 142 mmol/L (ref 135–146)
Total Bilirubin: 0.9 mg/dL (ref 0.2–1.2)
Total Protein: 6.2 g/dL (ref 6.1–8.1)
eGFR: 54 mL/min/{1.73_m2} — ABNORMAL LOW (ref >=60)

## 2022-04-14 LAB — TSH: TSH: 1.14 mIU/L (ref 0.40–4.50)

## 2022-05-03 ENCOUNTER — Other Ambulatory Visit: Payer: Self-pay | Admitting: Family Medicine

## 2022-05-04 NOTE — Telephone Encounter (Signed)
Requested medication (s) are due for refill today: routing for approval  Requested medication (s) are on the active medication list: yes  Last refill:  02/24/22  Future visit scheduled: yes  Notes to clinic:  Manual Review: If patient's warfarin is managed by Anti-Coag team, route request to them. If not, route request to the provider.      Requested Prescriptions  Pending Prescriptions Disp Refills   warfarin (COUMADIN) 5 MG tablet [Pharmacy Med Name: Warfarin Sodium 5 MG Oral Tablet] 60 tablet 0    Sig: TAKE 1 TABLET BY MOUTH ON MONDAY,WEDNESDAY,FRIDAY,SATURDAY AND 1/2 TABLET BY MOUTH OTHER DAYS.     Hematology:  Anticoagulants - warfarin Failed - 05/03/2022  1:34 PM      Failed - Manual Review: If patient's warfarin is managed by Anti-Coag team, route request to them. If not, route request to the provider.      Failed - INR in normal range and within 30 days    INR, fingerstick  Date Value Ref Range Status  04/13/2022 2.0 (H) ratio Final    Comment:    INRs >2.9 may be falsely elevated in patients receiving either unfractionated Heparin or Low Molecular Weight Heparin. Follow up testing in a hospital laboratory may be helpful if clinically indicated.  . INR results of > or = 5.0 should be verified using the standard venipuncture procedure. Reference Range                     0.9-1.1 Moderate-intensity Warfarin Therapy 2.0-3.0 Higher-intensity Warfarin Therapy   3.0-4.0  .          Failed - Valid encounter within last 3 months    Recent Outpatient Visits           9 months ago Chronic atrial fibrillation ([place])   [place] [facility] Schaumann, Cammie Mcgee, MD   10 months ago Chronic atrial fibrillation [facility])   [place] Pickard, Cammie Mcgee, MD   1 year ago Chronic atrial fibrillation [facility])   [place] Pickard, Cammie Mcgee, MD   1 year ago Confusion   [place] [facility] Schaumann, Cammie Mcgee, MD   1 year ago Chronic atrial  fibrillation [facility])   [facility] Pickard, Cammie Mcgee, MD              Passed - HCT in normal range and within 360 days    HCT  Date Value Ref Range Status  04/13/2022 36.2 35.0 - 45.0 % Final  12/05/2015 40.1 34.8 - 46.6 % Final         Passed - Patient is not pregnant

## 2022-05-14 ENCOUNTER — Encounter: Payer: Self-pay | Admitting: Family Medicine

## 2022-06-15 ENCOUNTER — Ambulatory Visit (INDEPENDENT_AMBULATORY_CARE_PROVIDER_SITE_OTHER): Payer: Medicare Other | Admitting: Family Medicine

## 2022-06-15 ENCOUNTER — Encounter: Payer: Self-pay | Admitting: Family Medicine

## 2022-06-15 VITALS — BP 132/76 | HR 102 | Temp 98.0°F | Ht 59.0 in | Wt 161.4 lb

## 2022-06-15 DIAGNOSIS — I482 Chronic atrial fibrillation, unspecified: Secondary | ICD-10-CM

## 2022-06-15 LAB — PT WITH INR/FINGERSTICK
INR, fingerstick: 1.7 ratio — ABNORMAL HIGH
PT, fingerstick: 20.6 s — ABNORMAL HIGH (ref 10.5–13.1)

## 2022-06-15 NOTE — Progress Notes (Signed)
+    Subjective:    Patient ID: Kathy [name], female    DOB: 1927/08/05, 89 y.o.   MRN: [id number]  HPI Patient is here today to recheck her Coumadin.  She is currently taking 5 mg on Monday, Wednesday, Friday, Saturday.  She takes 2.5 mg on Tuesday, Thursday, Sunday.  INR is subtherapeutic at 1.7   Past Medical History:  Diagnosis Date   Arthritis    both shoulders   Atrial fibrillation (HCC)    Breast cancer ([place])    Cancer ([place])    left breast   Cataracts, bilateral    Diverticulitis    Diverticulosis    Hemorrhoids    internal   Hiatal hernia    Hyperlipidemia    takes Zocor daily   Hypertension    takes Amlodipine daily   Joint pain    both shoulders   Macular degeneration 02/2003   Nasal congestion    Nocturia    Obesity    Osteoporosis    PONV (postoperative nausea and vomiting)    Prediabetes    Shortness of breath    with exertion   Sinus drainage    Urinary frequency    Urinary incontinence    Past Surgical History:  Procedure Laterality Date   bilateral knee replacements  2008   BREAST BIOPSY  05/04/2011   Procedure: BREAST BIOPSY WITH NEEDLE LOCALIZATION;  Surgeon: Judieth Keens, DO;  Location: [place];  Service: General;  Laterality: Left;   CARDIOVERSION N/A 09/15/2012   Procedure: CARDIOVERSION;  Surgeon: Laverda Page, MD;  Location: [place];  Service: Cardiovascular;  Laterality: N/A;  h&p in file-HW   COLONOSCOPY     ESOPHAGOGASTRODUODENOSCOPY     HEMORRHOID SURGERY     outpatient "done in doctor's office"   JOINT REPLACEMENT  2008   bilateral knee replacement   left breast biopsy  1984   right breast biopsy  1993   Current Outpatient Medications on File Prior to Visit  Medication Sig Dispense Refill   Calcium Carbonate-Vitamin D 600-400 MG-UNIT tablet Take 1 tablet by mouth 2 (two) times daily.     cholecalciferol (VITAMIN D) 400 UNITS TABS Take 400 Units by mouth daily. Take in addition to Caltrate with Vitamin D to get 1200  units of Vitamin D     COMIRNATY SUSP injection      hydrochlorothiazide (HYDRODIURIL) 25 MG tablet TAKE 1 TABLET BY MOUTH DAILY 100 tablet 2   lisinopril (ZESTRIL) 20 MG tablet TAKE 1 TABLET BY MOUTH DAILY 90 tablet 0   metoprolol tartrate (LOPRESSOR) 50 MG tablet TAKE 1 TABLET BY MOUTH TWICE  DAILY 200 tablet 2   oxyCODONE-acetaminophen (PERCOCET) 5-325 MG tablet Take 1 tablet by mouth every 4 (four) hours as needed for severe pain (stop hydrocodone). 30 tablet 0   simvastatin (ZOCOR) 20 MG tablet TAKE 1 TABLET BY MOUTH AT  BEDTIME 100 tablet 2   vitamin B-12 (CYANOCOBALAMIN) 1000 MCG tablet Take 1,000 mcg by mouth daily.     warfarin (COUMADIN) 5 MG tablet TAKE 1 TABLET BY MOUTH ON MONDAY,WEDNESDAY,FRIDAY,SATURDAY AND 1/2 TABLET BY MOUTH OTHER DAYS. 60 tablet 0   No current facility-administered medications on file prior to visit.   Marland Kitchenall Social History   Socioeconomic History   Marital status: Widowed    Spouse name: Not on file   Number of children: 0   Years of education: Not on file   Highest education level: Not on file  Occupational History  Occupation: retired  Tobacco Use   Smoking status: Former    Packs/day: 0.25    Years: 10.00    Total pack years: 2.50    Types: Cigarettes    Quit date: 05/21/1971    Years since quitting: 51.1   Smokeless tobacco: Never   Tobacco comments:    quit in the 60's  Substance and Sexual Activity   Alcohol use: No   Drug use: No   Sexual activity: Not Currently  Other Topics Concern   Not on file  Social History Narrative   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    No children but nephew, Mortimer Fries and his wife Juliann Pulse, care for patient.    Lives alone.    Social Determinants of Health   Financial Resource Strain: Low Risk  (05/23/2021)   Overall Financial Resource Strain (CARDIA)    Difficulty of Paying Living Expenses: Not hard at all  Food Insecurity: No Food Insecurity (05/23/2021)   Hunger Vital Sign    Worried About  Running Out of Food in the Last Year: Never true    Ran Out of Food in the Last Year: Never true  Transportation Needs: No Transportation Needs (05/23/2021)   PRAPARE - Hydrologist (Medical): No    Lack of Transportation (Non-Medical): No  Physical Activity: Insufficiently Active (05/23/2021)   Exercise Vital Sign    Days of Exercise per Week: 4 days    Minutes of Exercise per Session: 20 min  Stress: No Stress Concern Present (05/23/2021)   [place]    Feeling of Stress : Not at all  Social Connections: Moderately Integrated (05/23/2021)   Social Connection and Isolation Panel [NHANES]    Frequency of Communication with Friends and Family: More than three times a week    Frequency of Social Gatherings with Friends and Family: More than three times a week    Attends Religious Services: More than 4 times per year    Active Member of [employer] or Organizations: Yes    Attends Archivist Meetings: More than 4 times per year    Marital Status: Widowed  Intimate Partner Violence: Not At Risk (05/23/2021)   Humiliation, Afraid, Rape, and Kick questionnaire    Fear of Current or Ex-Partner: No    Emotionally Abused: No    Physically Abused: No    Sexually Abused: No     Review of Systems  All other systems reviewed and are negative.      Objective:   Physical Exam Constitutional:      General: She is not in acute distress.    Appearance: Normal appearance. She is normal weight. She is not ill-appearing or toxic-appearing.  Cardiovascular:     Rate and Rhythm: Normal rate. Rhythm irregular.     Heart sounds: Normal heart sounds.  Pulmonary:     Effort: Pulmonary effort is normal. No respiratory distress.     Breath sounds: No wheezing, rhonchi or rales.  Musculoskeletal:        General: No swelling or deformity.     Right lower leg: No edema.     Left lower leg: No edema.   Skin:    Findings: No bruising or erythema.  Neurological:     General: No focal deficit present.     Mental Status: She is alert and oriented to person, place, and time.  Psychiatric:  Mood and Affect: Mood normal.        Behavior: Behavior normal.        Thought Content: Thought content normal.           Assessment & Plan:  Chronic atrial fibrillation ([place]) - Plan: PT with INR/Fingerstick  Patient has always been consistent with her previous regimen of Coumadin.  Therefore I will not make a permanent change.  I will have her take an extra half of a tablet tomorrow in an effort to improve her INR then I want her to resume her previous schedule of a whole tablet on Monday, Wednesday, Friday, Saturday and one half a tablet on Tuesday, Thursday, Sunday.  Recheck in 4 weeks.  If consistently low at that point we will make a permanent change

## 2022-07-02 ENCOUNTER — Ambulatory Visit (INDEPENDENT_AMBULATORY_CARE_PROVIDER_SITE_OTHER): Payer: Medicare Other | Admitting: Family Medicine

## 2022-07-02 ENCOUNTER — Encounter: Payer: Self-pay | Admitting: Family Medicine

## 2022-07-02 VITALS — BP 120/78 | HR 86 | Temp 97.6°F | Ht 59.0 in | Wt 159.0 lb

## 2022-07-02 DIAGNOSIS — Z0000 Encounter for general adult medical examination without abnormal findings: Secondary | ICD-10-CM | POA: Diagnosis not present

## 2022-07-02 NOTE — Progress Notes (Signed)
Subjective:   Kathy [name] is a 89 y.o. female who presents for Medicare Annual (Subsequent) preventive examination.  Review of Systems    No concerns Cardiac Risk Factors include: advanced age (>34mn, >>73women);obesity (BMI >30kg/m2);sedentary lifestyle     Objective:    Today's Vitals   07/02/22 1358  BP: 120/78  Pulse: 86  Temp: 97.6 F (36.4 C)  TempSrc: Oral  SpO2: 98%  Weight: 159 lb (72.1 kg)  Height: '4\' 11"'$  (1.499 m)   Body mass index is 32.11 kg/m.     07/02/2022    2:17 PM 05/23/2021    2:15 PM 12/12/2015    6:18 PM 12/12/2015    2:08 PM 10/04/2015    1:57 PM 07/24/2015    9:48 AM 12/11/2014    2:21 PM  Advanced Directives  Does Patient Have a Medical Advance Directive? Yes Yes No No Yes No No  Type of Advance Directive Out of facility DNR (pink MOST or yellow form) [place] will       Does patient want to make changes to medical advance directive? No - Patient declined        Copy of HF[place]ace [place] Chart?  Yes - validated most recent copy scanned in chart (See row information)   No - copy requested    Would patient like information on creating a medical advance directive?      No - patient declined information     Current Medications (verified) Outpatient Encounter Medications as of 07/02/2022  Medication Sig   Calcium Carbonate-Vitamin D 600-400 MG-UNIT tablet Take 1 tablet by mouth 2 (two) times daily.   cholecalciferol (VITAMIN D) 400 UNITS TABS Take 400 Units by mouth daily. Take in addition to Caltrate with Vitamin D to get 1200 units of Vitamin D   COMIRNATY SUSP injection    hydrochlorothiazide (HYDRODIURIL) 25 MG tablet TAKE 1 TABLET BY MOUTH DAILY   lisinopril (ZESTRIL) 20 MG tablet TAKE 1 TABLET BY MOUTH DAILY   metoprolol tartrate (LOPRESSOR) 50 MG tablet TAKE 1 TABLET BY MOUTH TWICE  DAILY   oxyCODONE-acetaminophen (PERCOCET) 5-325 MG tablet Take 1 tablet by mouth every 4 (four) hours as needed for severe  pain (stop hydrocodone).   simvastatin (ZOCOR) 20 MG tablet TAKE 1 TABLET BY MOUTH AT  BEDTIME   vitamin B-12 (CYANOCOBALAMIN) 1000 MCG tablet Take 1,000 mcg by mouth daily.   warfarin (COUMADIN) 5 MG tablet TAKE 1 TABLET BY MOUTH ON MONDAY,WEDNESDAY,FRIDAY,SATURDAY AND 1/2 TABLET BY MOUTH OTHER DAYS.   No facility-administered encounter medications on file as of 07/02/2022.    Allergies (verified) Patient has no known allergies.   History: Past Medical History:  Diagnosis Date   Arthritis    both shoulders   Atrial fibrillation ([place]    [place]    Cancer ([place]    left breast   Cataracts, bilateral    Diverticulitis    Diverticulosis    Hemorrhoids    internal   Hiatal hernia    Hyperlipidemia    takes Zocor daily   Hypertension    takes Amlodipine daily   Joint pain    both shoulders   Macular degeneration 02/2003   Nasal congestion    Nocturia    Obesity    Osteoporosis    PONV (postoperative nausea and vomiting)    Prediabetes    Shortness of breath    with exertion   Sinus drainage    Urinary frequency  Urinary incontinence    Past Surgical History:  Procedure Laterality Date   bilateral knee replacements  2008   BREAST BIOPSY  05/04/2011   Procedure: BREAST BIOPSY WITH NEEDLE LOCALIZATION;  Surgeon: Judieth Keens, DO;  Location: [place];  Service: General;  Laterality: Left;   CARDIOVERSION N/A 09/15/2012   Procedure: CARDIOVERSION;  Surgeon: Laverda Page, MD;  [facility] ENDOSCOPY;  Service: Cardiovascular;  Laterality: N/A;  h&p in file-HW   COLONOSCOPY     ESOPHAGOGASTRODUODENOSCOPY     HEMORRHOID SURGERY     outpatient "done in doctor's office"   JOINT REPLACEMENT  2008   bilateral knee replacement   left breast biopsy  1984   right breast biopsy  1993   Family History  Problem Relation Age of Onset   Cancer Mother        pancreatic   Cancer Father        esophageal   Cancer Sister        ovarian   Cancer Sister         lung   Cancer Brother        kidney   Cancer Sister        breast cancer   Heart disease Brother    COPD Brother    Cancer Brother        Kidney/Nephrectomy   Anesthesia problems Neg Hx    Hypotension Neg Hx    Malignant hyperthermia Neg Hx    Pseudochol deficiency Neg Hx    Social History   Socioeconomic History   Marital status: Widowed    Spouse name: Not on file   Number of children: 0   Years of education: Not on file   Highest education level: Not on file  Occupational History   Occupation: retired  Tobacco Use   Smoking status: Former    Packs/day: 0.25    Years: 10.00    Total pack years: 2.50    Types: Cigarettes    Quit date: 05/21/1971    Years since quitting: 51.1   Smokeless tobacco: Never   Tobacco comments:    quit in the 60's  Substance and Sexual Activity   Alcohol use: No   Drug use: No   Sexual activity: Not Currently  Other Topics Concern   Not on file  Social History Narrative   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    No children but nephew, Mortimer Fries and his wife Juliann Pulse, care for patient.    Lives alone.    Social Determinants of Health   Financial Resource Strain: Low Risk  (07/02/2022)   Overall Financial Resource Strain (CARDIA)    Difficulty of Paying Living Expenses: Not hard at all  Food Insecurity: No Food Insecurity (07/02/2022)   Hunger Vital Sign    Worried About Running Out of Food in the Last Year: Never true    Ran Out of Food in the Last Year: Never true  Transportation Needs: No Transportation Needs (07/02/2022)   PRAPARE - Hydrologist (Medical): No    Lack of Transportation (Non-Medical): No  Physical Activity: Inactive (07/02/2022)   Exercise Vital Sign    Days of Exercise per Week: 0 days    Minutes of Exercise per Session: 0 min  Stress: No Stress Concern Present (07/02/2022)   [place]    Feeling of Stress : Not at all   Social Connections: Socially  Isolated (07/02/2022)   Social Connection and Isolation Panel [NHANES]    Frequency of Communication with Friends and Family: Never    Frequency of Social Gatherings with Friends and Family: Once a week    Attends Religious Services: More than 4 times per year    Active Member of [employer] or Organizations: No    Attends Archivist Meetings: Never    Marital Status: Widowed    Tobacco Counseling Counseling given: Not Answered Tobacco comments: quit in the 60's   Clinical Intake:  Pre-visit preparation completed: Yes        Nutritional Status: BMI > 30  Obese Nutritional Risks: None Diabetes: No  How often do you need to have someone help you when you read instructions, pamphlets, or other written materials from your doctor or pharmacy?: 3 - Sometimes  Diabetic?no  Interpreter Needed?: No      Activities of Daily Living    07/02/2022    2:19 PM  In your present state of health, do you have any difficulty performing the following activities:  Hearing? 0  Vision? 0  Difficulty concentrating or making decisions? 0  Walking or climbing stairs? 1  Comment stairs  Dressing or bathing? 0  Doing errands, shopping? 0  Preparing Food and eating ? N  Using the Toilet? N  In the past six months, have you accidently leaked urine? Y  Do you have problems with loss of bowel control? N  Managing your Medications? Y  Managing your Finances? Y  Housekeeping or managing your Housekeeping? Y    Patient Care Team: Susy Frizzle, MD as PCP - General (Family Medicine)  Indicate any recent Medical Services you may have received from other than [facility] providers in the past year (date may be approximate).     Assessment:   This is a routine wellness examination for [place].  Hearing/Vision screen No results found.  Dietary issues and exercise activities discussed: Current Exercise Habits: The patient does not participate in regular exercise  at present, Exercise limited by: orthopedic condition(s)   Goals Addressed             This Visit's Progress    Exercise 3x per week (30 min per time)       Unable to exercise due to her arthritis       Depression Screen    07/02/2022    2:12 PM 06/15/2022    2:02 PM 04/13/2022    2:06 PM 05/23/2021    2:10 PM 08/12/2020    2:11 PM 11/18/2017    2:07 PM 03/11/2017    1:58 PM  PHQ 2/9 Scores  PHQ - 2 Score 0 0 0 0 0 0 0  PHQ- 9 Score       0    Fall Risk    06/15/2022    2:02 PM 04/13/2022    2:06 PM 05/23/2021    2:16 PM 08/12/2020    2:10 PM 11/18/2017    2:07 PM  Fall Risk   Falls in the past year? 0 0 1 0 No  Number falls in past yr: 0 0 0    Injury with Fall? 0 0 0    Risk for fall due to : No Fall Risks No Fall Risks Impaired mobility;History of fall(s);Impaired balance/gait No Fall Risks   Follow up Falls prevention discussed Falls prevention discussed Falls prevention discussed Falls evaluation completed     FALL RISK PREVENTION PERTAINING TO THE HOME:  Any stairs  in or around the home? No  If so, are there any without handrails? No  Home free of loose throw rugs in walkways, pet beds, electrical cords, etc? No  Adequate lighting in your home to reduce risk of falls? Yes   ASSISTIVE DEVICES UTILIZED TO PREVENT FALLS:  Life alert? Yes  Use of a cane, walker or w/c? Yes  Grab bars in the bathroom? Yes  Shower chair or bench in shower? Yes  Elevated toilet seat or a handicapped toilet? Yes   TIMED UP AND GO:  Was the test performed? Yes .  Length of time to ambulate 10 feet: 13 sec.   Gait steady and fast with assistive device  Cognitive Function:        07/02/2022    2:20 PM 05/23/2021    2:21 PM  6CIT Screen  What Year? 0 points 0 points  What month? 0 points 0 points  What time? 0 points 0 points  Count back from 20 0 points 0 points  Months in reverse 0 points 2 points  Repeat phrase 0 points 0 points  Total Score 0 points 2 points     Immunizations Immunization History  Administered Date(s) Administered   Fluad Quad(high Dose 65+) 02/07/2019, 01/08/2020, 02/17/2021, 02/09/2022   Influenza Split 02/09/2012   Influenza, High Dose Seasonal PF 01/28/2017   Influenza,inj,Quad PF,6+ Mos 01/25/2015, 12/27/2015, 12/30/2017   Moderna Sars-Covid-2 Vaccination 05/23/2019, 06/20/2019   Pneumococcal Conjugate-13 09/29/2019   Pneumococcal Polysaccharide-23 09/11/1996   Td 09/11/1996    TDAP status: Due, Education has been provided regarding the importance of this vaccine. Advised may receive this vaccine at local pharmacy or Health Dept. Aware to provide a copy of the vaccination record if obtained from local pharmacy or Health Dept. Verbalized acceptance and understanding.  Flu Vaccine status: Up to date  Pneumococcal vaccine status: Up to date  Covid-19 vaccine status: Information provided on how to obtain vaccines.   Qualifies for Shingles Vaccine? Yes   Zostavax completed No   Shingrix Completed?: No.    Education has been provided regarding the importance of this vaccine. Patient has been advised to call insurance company to determine out of pocket expense if they have not yet received this vaccine. Advised may also receive vaccine at local pharmacy or Health Dept. Verbalized acceptance and understanding.  Screening Tests Health Maintenance  Topic Date Due   COVID-19 Vaccine (3 - Moderna risk series) 07/18/2019   Medicare Annual Wellness (AWV)  07/27/2022 (Originally 05/23/2022)   Zoster Vaccines- Shingrix (1 of 2) 09/13/2022 (Originally 04/22/1947)   MAMMOGRAM  03/31/2023   Pneumonia Vaccine 18+ Years old  Completed   INFLUENZA VACCINE  Completed   DEXA SCAN  Completed   HPV VACCINES  Aged Out   DTaP/Tdap/Td  Discontinued    Health Maintenance  Health Maintenance Due  Topic Date Due   COVID-19 Vaccine (3 - Moderna risk series) 07/18/2019    Colorectal cancer screening: Type of screening: Colonoscopy.  Completed 05/24/2013. Repeat every 10 years  Mammogram status: Completed 03/30/2022. Repeat every year  Bone Density status: Completed 05/29/2019. Results reflect: Bone density results: OSTEOPENIA. Repeat every 2 years.  Lung Cancer Screening: (Low Dose CT Chest recommended if Age 33-80 years, 30 pack-year currently smoking OR have quit w/in 15years.) does not qualify.   Lung Cancer Screening Referral: n/a  Additional Screening:  Hepatitis C Screening: does not qualify; Completed   Vision Screening: Recommended annual ophthalmology exams for early detection of glaucoma and other disorders  of the eye. Is the patient up to date with their annual eye exam?  Yes  Who is the provider or what is the name of the office in which the patient attends annual eye exams? [facility] If pt is not established with a provider, would they like to be referred to a provider to establish care? No .   Dental Screening: Recommended annual dental exams for proper oral hygiene  Community Resource Referral / Chronic Care Management: CRR required this visit?  No   CCM required this visit?  No      Plan:     I have personally reviewed and noted the following in the patient's chart:   Medical and social history Use of alcohol, tobacco or illicit drugs  Current medications and supplements including opioid prescriptions. Patient is currently taking opioid prescriptions. Information provided to patient regarding non-opioid alternatives. Patient advised to discuss non-opioid treatment plan with their provider. Functional ability and status Nutritional status Physical activity Advanced directives List of other physicians Hospitalizations, surgeries, and ER visits in previous 12 months Vitals Screenings to include cognitive, depression, and falls Referrals and appointments  In addition, I have reviewed and discussed with patient certain preventive protocols, quality metrics, and best practice  recommendations. A written personalized care plan for preventive services as well as general preventive health recommendations were provided to patient.     Rubie Maid, [place]   07/02/2022   Nurse Notes: none

## 2022-07-14 ENCOUNTER — Ambulatory Visit (INDEPENDENT_AMBULATORY_CARE_PROVIDER_SITE_OTHER): Payer: Medicare Other | Admitting: Family Medicine

## 2022-07-14 ENCOUNTER — Encounter: Payer: Self-pay | Admitting: Family Medicine

## 2022-07-14 VITALS — BP 126/76 | HR 81 | Temp 97.7°F | Ht 59.0 in | Wt 157.0 lb

## 2022-07-14 DIAGNOSIS — I482 Chronic atrial fibrillation, unspecified: Secondary | ICD-10-CM

## 2022-07-14 LAB — PT WITH INR/FINGERSTICK
INR, fingerstick: 3.1 ratio — ABNORMAL HIGH
PT, fingerstick: 37.6 s — ABNORMAL HIGH (ref 10.5–13.1)

## 2022-07-14 NOTE — Progress Notes (Signed)
+    Subjective:    Patient ID: Kathy [name], female    DOB: 1928/03/05, 89 y.o.   MRN: [id number]  HPI Patient is here today to recheck her Coumadin.  She is currently taking 5 mg on Monday, Wednesday, Friday, Saturday.  She takes 2.5 mg on Tuesday, Thursday, Sunday.  INR is 3.1.   Past Medical History:  Diagnosis Date   Arthritis    both shoulders   Atrial fibrillation ([place])    Breast cancer ([place])    Cancer ([place])    left breast   Cataracts, bilateral    Diverticulitis    Diverticulosis    Hemorrhoids    internal   Hiatal hernia    Hyperlipidemia    takes Zocor daily   Hypertension    takes Amlodipine daily   Joint pain    both shoulders   Macular degeneration 02/2003   Nasal congestion    Nocturia    Obesity    Osteoporosis    PONV (postoperative nausea and vomiting)    Prediabetes    Shortness of breath    with exertion   Sinus drainage    Urinary frequency    Urinary incontinence    Past Surgical History:  Procedure Laterality Date   bilateral knee replacements  2008   BREAST BIOPSY  05/04/2011   Procedure: BREAST BIOPSY WITH NEEDLE LOCALIZATION;  Surgeon: Judieth Keens, DO;  Location: [place];  Service: General;  Laterality: Left;   CARDIOVERSION N/A 09/15/2012   Procedure: CARDIOVERSION;  Surgeon: Laverda Page, MD;  Location: [place];  Service: Cardiovascular;  Laterality: N/A;  h&p in file-HW   COLONOSCOPY     ESOPHAGOGASTRODUODENOSCOPY     HEMORRHOID SURGERY     outpatient "done in doctor's office"   JOINT REPLACEMENT  2008   bilateral knee replacement   left breast biopsy  1984   right breast biopsy  1993   Current Outpatient Medications on File Prior to Visit  Medication Sig Dispense Refill   Calcium Carbonate-Vitamin D 600-400 MG-UNIT tablet Take 1 tablet by mouth 2 (two) times daily.     cholecalciferol (VITAMIN D) 400 UNITS TABS Take 400 Units by mouth daily. Take in addition to Caltrate with Vitamin D to get 1200 units of Vitamin  D     COMIRNATY SUSP injection      hydrochlorothiazide (HYDRODIURIL) 25 MG tablet TAKE 1 TABLET BY MOUTH DAILY 100 tablet 2   lisinopril (ZESTRIL) 20 MG tablet TAKE 1 TABLET BY MOUTH DAILY 90 tablet 0   metoprolol tartrate (LOPRESSOR) 50 MG tablet TAKE 1 TABLET BY MOUTH TWICE  DAILY 200 tablet 2   oxyCODONE-acetaminophen (PERCOCET) 5-325 MG tablet Take 1 tablet by mouth every 4 (four) hours as needed for severe pain (stop hydrocodone). 30 tablet 0   simvastatin (ZOCOR) 20 MG tablet TAKE 1 TABLET BY MOUTH AT  BEDTIME 100 tablet 2   vitamin B-12 (CYANOCOBALAMIN) 1000 MCG tablet Take 1,000 mcg by mouth daily.     warfarin (COUMADIN) 5 MG tablet TAKE 1 TABLET BY MOUTH ON MONDAY,WEDNESDAY,FRIDAY,SATURDAY AND 1/2 TABLET BY MOUTH OTHER DAYS. 60 tablet 0   No current facility-administered medications on file prior to visit.   Marland Kitchenall Social History   Socioeconomic History   Marital status: Widowed    Spouse name: Not on file   Number of children: 0   Years of education: Not on file   Highest education level: Not on file  Occupational History   Occupation:  retired  Tobacco Use   Smoking status: Former    Packs/day: 0.25    Years: 10.00    Additional pack years: 0.00    Total pack years: 2.50    Types: Cigarettes    Quit date: 05/21/1971    Years since quitting: 51.1   Smokeless tobacco: Never   Tobacco comments:    quit in the 60's  Substance and Sexual Activity   Alcohol use: No   Drug use: No   Sexual activity: Not Currently  Other Topics Concern   Not on file  Social History Narrative   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    No children but nephew, Mortimer Fries and his wife Juliann Pulse, care for patient.    Lives alone.    Social Determinants of Health   Financial Resource Strain: Low Risk  (07/02/2022)   Overall Financial Resource Strain (CARDIA)    Difficulty of Paying Living Expenses: Not hard at all  Food Insecurity: No Food Insecurity (07/02/2022)   Hunger Vital Sign     Worried About Running Out of Food in the Last Year: Never true    Ran Out of Food in the Last Year: Never true  Transportation Needs: No Transportation Needs (07/02/2022)   PRAPARE - Hydrologist (Medical): No    Lack of Transportation (Non-Medical): No  Physical Activity: Inactive (07/02/2022)   Exercise Vital Sign    Days of Exercise per Week: 0 days    Minutes of Exercise per Session: 0 min  Stress: No Stress Concern Present (07/02/2022)   [place]    Feeling of Stress : Not at all  Social Connections: Socially Isolated (07/02/2022)   Social Connection and Isolation Panel [NHANES]    Frequency of Communication with Friends and Family: Never    Frequency of Social Gatherings with Friends and Family: Once a week    Attends Religious Services: More than 4 times per year    Active Member of [employer] or Organizations: No    Attends Archivist Meetings: Never    Marital Status: Widowed  Intimate Partner Violence: Not At Risk (07/02/2022)   Humiliation, Afraid, Rape, and Kick questionnaire    Fear of Current or Ex-Partner: No    Emotionally Abused: No    Physically Abused: No    Sexually Abused: No     Review of Systems  All other systems reviewed and are negative.      Objective:   Physical Exam Constitutional:      General: She is not in acute distress.    Appearance: Normal appearance. She is normal weight. She is not ill-appearing or toxic-appearing.  Cardiovascular:     Rate and Rhythm: Normal rate. Rhythm irregular.     Heart sounds: Normal heart sounds.  Pulmonary:     Effort: Pulmonary effort is normal. No respiratory distress.     Breath sounds: No wheezing, rhonchi or rales.  Musculoskeletal:        General: No swelling or deformity.     Right lower leg: No edema.     Left lower leg: No edema.  Skin:    Findings: No bruising or erythema.  Neurological:     General:  No focal deficit present.     Mental Status: She is alert and oriented to person, place, and time.  Psychiatric:        Mood and Affect: Mood normal.  Behavior: Behavior normal.        Thought Content: Thought content normal.           Assessment & Plan:  Chronic atrial fibrillation (HCC)  INR today is 3.1.  I will make no changes in her Coumadin dose although I did recommend that she some vitamin K containing greens with dinner tonight.  We discussed possibly switching to Xarelto or Eliquis for her convenience.  She will check on price and let me know.  Otherwise recheck INR in 6 to 8 weeks

## 2022-07-16 ENCOUNTER — Telehealth: Payer: Self-pay

## 2022-07-16 ENCOUNTER — Other Ambulatory Visit: Payer: Self-pay | Admitting: Family Medicine

## 2022-07-16 MED ORDER — APIXABAN 5 MG PO TABS: 5.0000 mg | ORAL_TABLET | Freq: Two times a day (BID) | ORAL | 3 refills | Status: DC

## 2022-07-16 NOTE — Telephone Encounter (Signed)
Pt's niece Joellen Jersey, called to advise that pt would like to try Eliquis as her blood thinner. Joellen Jersey states her insurance will cover it at $131.00 for a 90 day supply. Please send to [facility]. Thank you.

## 2022-07-16 NOTE — Telephone Encounter (Signed)
Niece Dorin Volkov called in concerned about pt's meds. Pt's niece would like a cb about which new med pt is choosing to have sent into pharmacy.   Cb#: [phone]

## 2022-08-03 ENCOUNTER — Other Ambulatory Visit: Payer: Self-pay | Admitting: Family Medicine

## 2022-09-14 ENCOUNTER — Encounter: Payer: Self-pay | Admitting: Family Medicine

## 2022-09-14 ENCOUNTER — Ambulatory Visit (INDEPENDENT_AMBULATORY_CARE_PROVIDER_SITE_OTHER): Payer: Medicare Other | Admitting: Family Medicine

## 2022-09-14 VITALS — BP 118/64 | HR 75 | Temp 97.6°F | Ht 59.0 in | Wt 157.0 lb

## 2022-09-14 DIAGNOSIS — I4821 Permanent atrial fibrillation: Secondary | ICD-10-CM | POA: Diagnosis not present

## 2022-09-14 NOTE — Progress Notes (Signed)
+    Subjective:    Patient ID: Kathy [name], female    DOB: 24-Mar-1928, 89 y.o.   MRN: [id number]  HPI Patient very sweet 4-year Caucasian female here today to discuss her patient.  Since I last saw her, we switched the patient from Coumadin to Eliquis.  She is here today for follow-up.  She denies any bleeding or bruising on Eliquis.  She denies any melena or hematochezia.  She denies any epistaxis.  Today she is in atrial fibrillation but her rate is controlled.  She denies any chest pain shortness of breath or dyspnea on exertion.  She denies any syncope  Past Medical History:  Diagnosis Date   Arthritis    both shoulders   Atrial fibrillation (HCC)    Breast cancer (HCC)    Cancer (HCC)    left breast   Cataracts, bilateral    Diverticulitis    Diverticulosis    Hemorrhoids    internal   Hiatal hernia    Hyperlipidemia    takes Zocor daily   Hypertension    takes Amlodipine daily   Joint pain    both shoulders   Macular degeneration 02/2003   Nasal congestion    Nocturia    Obesity    Osteoporosis    PONV (postoperative nausea and vomiting)    Prediabetes    Shortness of breath    with exertion   Sinus drainage    Urinary frequency    Urinary incontinence    Past Surgical History:  Procedure Laterality Date   bilateral knee replacements  2008   BREAST BIOPSY  05/04/2011   Procedure: BREAST BIOPSY WITH NEEDLE LOCALIZATION;  Surgeon: Rulon Abide, DO;  [facility];  Service: General;  Laterality: Left;   CARDIOVERSION N/A 09/15/2012   Procedure: CARDIOVERSION;  Surgeon: Pamella Pert, MD;  Location: [facility];  Service: Cardiovascular;  Laterality: N/A;  h&p in file-HW   COLONOSCOPY     ESOPHAGOGASTRODUODENOSCOPY     HEMORRHOID SURGERY     outpatient "done in doctor's office"   JOINT REPLACEMENT  2008   bilateral knee replacement   left breast biopsy  1984   right breast biopsy  1993   Current Outpatient Medications on File Prior to Visit   Medication Sig Dispense Refill   apixaban (ELIQUIS) 5 MG TABS tablet Take 1 tablet (5 mg total) by mouth 2 (two) times daily. Stop coumadin 180 tablet 3   Calcium Carbonate-Vitamin D 600-400 MG-UNIT tablet Take 1 tablet by mouth 2 (two) times daily.     cholecalciferol (VITAMIN D) 400 UNITS TABS Take 400 Units by mouth daily. Take in addition to Caltrate with Vitamin D to get 1200 units of Vitamin D     COMIRNATY SUSP injection      hydrochlorothiazide (HYDRODIURIL) 25 MG tablet TAKE 1 TABLET BY MOUTH DAILY 100 tablet 2   lisinopril (ZESTRIL) 20 MG tablet TAKE 1 TABLET BY MOUTH DAILY 90 tablet 3   metoprolol tartrate (LOPRESSOR) 50 MG tablet TAKE 1 TABLET BY MOUTH TWICE  DAILY 200 tablet 2   oxyCODONE-acetaminophen (PERCOCET) 5-325 MG tablet Take 1 tablet by mouth every 4 (four) hours as needed for severe pain (stop hydrocodone). 30 tablet 0   simvastatin (ZOCOR) 20 MG tablet TAKE 1 TABLET BY MOUTH AT  BEDTIME 100 tablet 2   vitamin B-12 (CYANOCOBALAMIN) 1000 MCG tablet Take 1,000 mcg by mouth daily.     warfarin (COUMADIN) 5 MG tablet TAKE 1 TABLET  BY MOUTH ON MONDAY,WEDNESDAY,FRIDAY,SATURDAY AND 1/2 TABLET BY MOUTH OTHER DAYS. (Patient not taking: Reported on 09/14/2022) 60 tablet 0   No current facility-administered medications on file prior to visit.   Marland Kitchenall Social History   Socioeconomic History   Marital status: Widowed    Spouse name: Not on file   Number of children: 0   Years of education: Not on file   Highest education level: Not on file  Occupational History   Occupation: retired  Tobacco Use   Smoking status: Former    Packs/day: 0.25    Years: 10.00    Additional pack years: 0.00    Total pack years: 2.50    Types: Cigarettes    Quit date: 05/21/1971    Years since quitting: 51.3   Smokeless tobacco: Never   Tobacco comments:    quit in the 60's  Substance and Sexual Activity   Alcohol use: No   Drug use: No   Sexual activity: Not Currently  Other Topics Concern    Not on file  Social History Narrative   Worked in Designer, fashion/clothing.    Widowed since 2014.    No children but nephew, Reita Cliche and his wife Olegario Messier, care for patient.    Lives alone.    Social Determinants of Health   Financial Resource Strain: Low Risk  (07/02/2022)   Overall Financial Resource Strain (CARDIA)    Difficulty of Paying Living Expenses: Not hard at all  Food Insecurity: No Food Insecurity (07/02/2022)   Hunger Vital Sign    Worried About Running Out of Food in the Last Year: Never true    Ran Out of Food in the Last Year: Never true  Transportation Needs: No Transportation Needs (07/02/2022)   PRAPARE - Administrator, Civil Service (Medical): No    Lack of Transportation (Non-Medical): No  Physical Activity: Inactive (07/02/2022)   Exercise Vital Sign    Days of Exercise per Week: 0 days    Minutes of Exercise per Session: 0 min  Stress: No Stress Concern Present (07/02/2022)   [employer] of Occupational Health - Occupational Stress Questionnaire    Feeling of Stress : Not at all  Social Connections: Socially Isolated (07/02/2022)   Social Connection and Isolation Panel [NHANES]    Frequency of Communication with Friends and Family: Never    Frequency of Social Gatherings with Friends and Family: Once a week    Attends Religious Services: More than 4 times per year    Active Member of [employer] or Organizations: No    Attends Banker Meetings: Never    Marital Status: Widowed  Intimate Partner Violence: Not At Risk (07/02/2022)   Humiliation, Afraid, Rape, and Kick questionnaire    Fear of Current or Ex-Partner: No    Emotionally Abused: No    Physically Abused: No    Sexually Abused: No     Review of Systems  All other systems reviewed and are negative.      Objective:   Physical Exam Constitutional:      General: She is not in acute distress.    Appearance: Normal appearance. She is normal weight. She is not ill-appearing or toxic-appearing.   Cardiovascular:     Rate and Rhythm: Normal rate. Rhythm irregular.     Heart sounds: Normal heart sounds.  Pulmonary:     Effort: Pulmonary effort is normal. No respiratory distress.     Breath sounds: No wheezing, rhonchi or rales.  Musculoskeletal:  General: No swelling or deformity.     Right lower leg: No edema.     Left lower leg: No edema.  Skin:    Findings: No bruising or erythema.  Neurological:     General: No focal deficit present.     Mental Status: She is alert and oriented to person, place, and time.  Psychiatric:        Mood and Affect: Mood normal.        Behavior: Behavior normal.        Thought Content: Thought content normal.           Assessment & Plan:  Permanent atrial fibrillation (HCC) - Plan: CBC with Differential/Platelet, COMPLETE METABOLIC PANEL WITH GFR I explained to the patient that she does not have to come in now for lab monitoring.  If she elects to stay on Eliquis I will check her just twice a year to monitor her CBC and CMP.  I will get this today as a baseline.  Continue Eliquis 5 mg twice daily.  Recheck in 6 months or as needed

## 2022-09-15 LAB — COMPLETE METABOLIC PANEL WITH GFR
AG Ratio: 2.2 (calc) (ref 1.0–2.5)
ALT: 7 U/L (ref 6–29)
AST: 12 U/L (ref 10–35)
Albumin: 4.4 g/dL (ref 3.6–5.1)
Alkaline phosphatase (APISO): 45 U/L (ref 37–153)
BUN/Creatinine Ratio: 26 (calc) — ABNORMAL HIGH (ref 6–22)
BUN: 29 mg/dL — ABNORMAL HIGH (ref 7–25)
CO2: 29 mmol/L (ref 20–32)
Calcium: 10.3 mg/dL (ref 8.6–10.4)
Chloride: 102 mmol/L (ref 98–110)
Creat: 1.11 mg/dL — ABNORMAL HIGH (ref 0.60–0.95)
Globulin: 2 g/dL (calc) (ref 1.9–3.7)
Glucose, Bld: 87 mg/dL (ref 65–99)
Potassium: 4.7 mmol/L (ref 3.5–5.3)
Sodium: 140 mmol/L (ref 135–146)
Total Bilirubin: 1.2 mg/dL (ref 0.2–1.2)
Total Protein: 6.4 g/dL (ref 6.1–8.1)
eGFR: 47 mL/min/{1.73_m2} — ABNORMAL LOW (ref >=60)

## 2022-09-15 LAB — CBC WITH DIFFERENTIAL/PLATELET
Absolute Monocytes: 585 cells/uL (ref 200–950)
Basophils Absolute: 53 cells/uL (ref 0–200)
Basophils Relative: 0.7 %
Eosinophils Absolute: 158 cells/uL (ref 15–500)
Eosinophils Relative: 2.1 %
HCT: 36.1 % (ref 35.0–45.0)
Hemoglobin: 12 g/dL (ref 11.7–15.5)
Lymphs Abs: 3143 cells/uL (ref 850–3900)
MCH: 34.1 pg — ABNORMAL HIGH (ref 27.0–33.0)
MCHC: 33.2 g/dL (ref 32.0–36.0)
MCV: 102.6 fL — ABNORMAL HIGH (ref 80.0–100.0)
MPV: 10.8 fL (ref 7.5–12.5)
Monocytes Relative: 7.8 %
Neutro Abs: 3563 cells/uL (ref 1500–7800)
Neutrophils Relative %: 47.5 %
Platelets: 163 10*3/uL (ref 140–400)
RBC: 3.52 10*6/uL — ABNORMAL LOW (ref 3.80–5.10)
RDW: 12.8 % (ref 11.0–15.0)
Total Lymphocyte: 41.9 %
WBC: 7.5 10*3/uL (ref 3.8–10.8)

## 2022-09-22 ENCOUNTER — Telehealth: Payer: Self-pay

## 2022-09-22 NOTE — Telephone Encounter (Signed)
Pt's daughter called in wanting to speak with nurse about pt's B12 med. Pt's daughter wanted to know if pt needs to increase the dosage amount? Please advise.  Cb# [id number]

## 2022-12-10 ENCOUNTER — Other Ambulatory Visit: Payer: Self-pay | Admitting: Family Medicine

## 2022-12-11 NOTE — Telephone Encounter (Signed)
Requested medication (s) are due for refill today: Yes  Requested medication (s) are on the active medication list: Yes  Last refill:  03/31/22  Future visit scheduled: Yes  Notes to clinic:  Unable to refill per protocol due to failed labs, no updated results.      Requested Prescriptions  Pending Prescriptions Disp Refills   simvastatin (ZOCOR) 20 MG tablet [Pharmacy Med Name: Simvastatin 20 MG Oral Tablet] 100 tablet 2    Sig: TAKE 1 TABLET BY MOUTH AT  BEDTIME     Cardiovascular:  Antilipid - Statins Failed - 12/10/2022 10:42 PM      Failed - Valid encounter within last 12 months    Recent Outpatient Visits           1 year ago Chronic atrial fibrillation (HCC)   [facility] Pickard, Priscille Heidelberg, MD   1 year ago Chronic atrial fibrillation [facility])   [facility] Pickard, Priscille Heidelberg, MD   1 year ago Chronic atrial fibrillation [facility])   [facility] Pickard, Priscille Heidelberg, MD   1 year ago Confusion   [facility] Tanya Nones, Priscille Heidelberg, MD   1 year ago Chronic atrial fibrillation [facility])   [facility] Pickard, Priscille Heidelberg, MD       Future Appointments             In 3 months Pickard, Priscille Heidelberg, MD [facility]            Failed - Lipid Panel in normal range within the last 12 months    Cholesterol  Date Value Ref Range Status  12/16/2016 96 <200 mg/dL Final   LDL Cholesterol  Date Value Ref Range Status  12/16/2016 38 <100 mg/dL Final   Direct LDL  Date Value Ref Range Status  10/07/2017 46 <100 mg/dL Final    Comment:    Greatly elevated Triglycerides values (>1200 mg/dL) interfere with the dLDL assay. As no Triglycerides  testing was ordered, interpret results with caution. . Desirable range <100 mg/dL for primary prevention;   <70 mg/dL for patients with CHD or diabetic patients  with > or = 2 CHD risk factors. Marland Kitchen    HDL  Date Value Ref Range Status   12/16/2016 34 (L) >50 mg/dL Final   Triglycerides  Date Value Ref Range Status  12/16/2016 120 <150 mg/dL Final         Passed - Patient is not pregnant      Signed Prescriptions Disp Refills   metoprolol tartrate (LOPRESSOR) 50 MG tablet 200 tablet 0    Sig: TAKE 1 TABLET BY MOUTH TWICE  DAILY     Cardiovascular:  Beta Blockers Failed - 12/10/2022 10:42 PM      Failed - Valid encounter within last 6 months    Recent Outpatient Visits           1 year ago Chronic atrial fibrillation (HCC)   [facility] Donita Brooks, MD   1 year ago Chronic atrial fibrillation [facility])   [facility] Pickard, Priscille Heidelberg, MD   1 year ago Chronic atrial fibrillation [facility])   [facility] Pickard, Priscille Heidelberg, MD   1 year ago Confusion   [facility] Pickard, Priscille Heidelberg, MD   1 year ago Chronic atrial fibrillation [facility])   [facility] Pickard, Priscille Heidelberg, MD  Future Appointments             In 3 months Pickard, Priscille Heidelberg, MD [facility], [facility]            Passed - Last BP in normal range    BP Readings from Last 1 Encounters:  09/14/22 118/64         Passed - Last Heart Rate in normal range    Pulse Readings from Last 1 Encounters:  09/14/22 75          hydrochlorothiazide (HYDRODIURIL) 25 MG tablet 100 tablet 0    Sig: TAKE 1 TABLET BY MOUTH DAILY     Cardiovascular: Diuretics - Thiazide Failed - 12/10/2022 10:42 PM      Failed - Cr in normal range and within 180 days    Creatinine  Date Value Ref Range Status  12/05/2015 0.9 0.6 - 1.1 mg/dL Final   Creat  Date Value Ref Range Status  09/14/2022 1.11 (H) 0.60 - 0.95 mg/dL Final         Failed - Valid encounter within last 6 months    Recent Outpatient Visits           1 year ago Chronic atrial fibrillation (HCC)   [facility] Pickard, Priscille Heidelberg, MD   1 year ago Chronic atrial fibrillation (HCC)    [facility] Donita Brooks, MD   1 year ago Chronic atrial fibrillation [facility])   [facility] Donita Brooks, MD   1 year ago Confusion   [facility] Tanya Nones, Priscille Heidelberg, MD   1 year ago Chronic atrial fibrillation [facility])   [facility] Pickard, Priscille Heidelberg, MD       Future Appointments             In 3 months Pickard, Priscille Heidelberg, MD  [facility]            Passed - K in normal range and within 180 days    Potassium  Date Value Ref Range Status  09/14/2022 4.7 3.5 - 5.3 mmol/L Final  12/05/2015 4.2 3.5 - 5.1 mEq/L Final         Passed - Na in normal range and within 180 days    Sodium  Date Value Ref Range Status  09/14/2022 140 135 - 146 mmol/L Final  12/05/2015 140 136 - 145 mEq/L Final         Passed - Last BP in normal range    BP Readings from Last 1 Encounters:  09/14/22 118/64

## 2022-12-11 NOTE — Telephone Encounter (Signed)
Requested Prescriptions  Pending Prescriptions Disp Refills   simvastatin (ZOCOR) 20 MG tablet [Pharmacy Med Name: Simvastatin 20 MG Oral Tablet] 100 tablet 2    Sig: TAKE 1 TABLET BY MOUTH AT  BEDTIME     Cardiovascular:  Antilipid - Statins Failed - 12/10/2022 10:42 PM      Failed - Valid encounter within last 12 months    Recent Outpatient Visits           1 year ago Chronic atrial fibrillation (HCC)   [facility] Pickard, Priscille Heidelberg, MD   1 year ago Chronic atrial fibrillation [facility])   [facility] Pickard, Priscille Heidelberg, MD   1 year ago Chronic atrial fibrillation [facility])   [facility] Pickard, Priscille Heidelberg, MD   1 year ago Confusion   [facility] Tanya Nones, Priscille Heidelberg, MD   1 year ago Chronic atrial fibrillation [facility])   [facility] Pickard, Priscille Heidelberg, MD       Future Appointments             In 3 months Pickard, Priscille Heidelberg, MD [facility]            Failed - Lipid Panel in normal range within the last 12 months    Cholesterol  Date Value Ref Range Status  12/16/2016 96 <200 mg/dL Final   LDL Cholesterol  Date Value Ref Range Status  12/16/2016 38 <100 mg/dL Final   Direct LDL  Date Value Ref Range Status  10/07/2017 46 <100 mg/dL Final    Comment:    Greatly elevated Triglycerides values (>1200 mg/dL) interfere with the dLDL assay. As no Triglycerides  testing was ordered, interpret results with caution. . Desirable range <100 mg/dL for primary prevention;   <70 mg/dL for patients with CHD or diabetic patients  with > or = 2 CHD risk factors. Marland Kitchen    HDL  Date Value Ref Range Status  12/16/2016 34 (L) >50 mg/dL Final   Triglycerides  Date Value Ref Range Status  12/16/2016 120 <150 mg/dL Final         Passed - Patient is not pregnant       metoprolol tartrate (LOPRESSOR) 50 MG tablet [Pharmacy Med Name: Metoprolol Tartrate 50 MG Oral Tablet] 200  tablet 0    Sig: TAKE 1 TABLET BY MOUTH TWICE  DAILY     Cardiovascular:  Beta Blockers Failed - 12/10/2022 10:42 PM      Failed - Valid encounter within last 6 months    Recent Outpatient Visits           1 year ago Chronic atrial fibrillation (HCC)   [facility] Donita Brooks, MD   1 year ago Chronic atrial fibrillation [facility])   [facility] Pickard, Priscille Heidelberg, MD   1 year ago Chronic atrial fibrillation [facility])   [facility] Pickard, Priscille Heidelberg, MD   1 year ago Confusion   [facility] Tanya Nones, Priscille Heidelberg, MD   1 year ago Chronic atrial fibrillation [facility])   [facility] Pickard, Priscille Heidelberg, MD       Future Appointments             In 3 months Pickard, Priscille Heidelberg, MD [facility]            Passed - Last BP in normal range  BP Readings from Last 1 Encounters:  09/14/22 118/64         Passed - Last Heart Rate in normal range    Pulse Readings from Last 1 Encounters:  09/14/22 75          hydrochlorothiazide (HYDRODIURIL) 25 MG tablet [Pharmacy Med Name: hydroCHLOROthiazide 25 MG Oral Tablet] 100 tablet 0    Sig: TAKE 1 TABLET BY MOUTH DAILY     Cardiovascular: Diuretics - Thiazide Failed - 12/10/2022 10:42 PM      Failed - Cr in normal range and within 180 days    Creatinine  Date Value Ref Range Status  12/05/2015 0.9 0.6 - 1.1 mg/dL Final   Creat  Date Value Ref Range Status  09/14/2022 1.11 (H) 0.60 - 0.95 mg/dL Final         Failed - Valid encounter within last 6 months    Recent Outpatient Visits           1 year ago Chronic atrial fibrillation (HCC)   [facility] Pickard, Priscille Heidelberg, MD   1 year ago Chronic atrial fibrillation (HCC)   [facility] Donita Brooks, MD   1 year ago Chronic atrial fibrillation [facility])   [facility] Donita Brooks, MD   1 year ago Confusion   [facility] Donita Brooks, MD   1 year ago Chronic atrial fibrillation [facility])   [facility] Pickard, Priscille Heidelberg, MD       Future Appointments             In 3 months Pickard, Priscille Heidelberg, MD [facility]            Passed - K in normal range and within 180 days    Potassium  Date Value Ref Range Status  09/14/2022 4.7 3.5 - 5.3 mmol/L Final  12/05/2015 4.2 3.5 - 5.1 mEq/L Final         Passed - Na in normal range and within 180 days    Sodium  Date Value Ref Range Status  09/14/2022 140 135 - 146 mmol/L Final  12/05/2015 140 136 - 145 mEq/L Final         Passed - Last BP in normal range    BP Readings from Last 1 Encounters:  09/14/22 118/64

## 2023-01-05 DIAGNOSIS — Z9841 Cataract extraction status, right eye: Secondary | ICD-10-CM | POA: Diagnosis not present

## 2023-01-05 DIAGNOSIS — H52223 Regular astigmatism, bilateral: Secondary | ICD-10-CM | POA: Diagnosis not present

## 2023-01-05 DIAGNOSIS — Z9842 Cataract extraction status, left eye: Secondary | ICD-10-CM | POA: Diagnosis not present

## 2023-01-05 DIAGNOSIS — H353131 Nonexudative age-related macular degeneration, bilateral, early dry stage: Secondary | ICD-10-CM | POA: Diagnosis not present

## 2023-01-19 ENCOUNTER — Emergency Department (HOSPITAL_BASED_OUTPATIENT_CLINIC_OR_DEPARTMENT_OTHER): Payer: Medicare Other

## 2023-01-19 ENCOUNTER — Encounter (HOSPITAL_BASED_OUTPATIENT_CLINIC_OR_DEPARTMENT_OTHER): Payer: Self-pay | Admitting: Radiology

## 2023-01-19 ENCOUNTER — Emergency Department (HOSPITAL_BASED_OUTPATIENT_CLINIC_OR_DEPARTMENT_OTHER)
Admission: EM | Admit: 2023-01-19 | Discharge: 2023-01-19 | Disposition: A | Discharge status: Home / Self Care | Payer: Medicare Other | Attending: Emergency Medicine | Admitting: Emergency Medicine

## 2023-01-19 DIAGNOSIS — E860 Dehydration: Secondary | ICD-10-CM | POA: Diagnosis not present

## 2023-01-19 DIAGNOSIS — R531 Weakness: Secondary | ICD-10-CM | POA: Insufficient documentation

## 2023-01-19 DIAGNOSIS — B349 Viral infection, unspecified: Secondary | ICD-10-CM | POA: Insufficient documentation

## 2023-01-19 DIAGNOSIS — Z743 Need for continuous supervision: Secondary | ICD-10-CM | POA: Diagnosis not present

## 2023-01-19 DIAGNOSIS — Z79899 Other long term (current) drug therapy: Secondary | ICD-10-CM | POA: Diagnosis not present

## 2023-01-19 DIAGNOSIS — I10 Essential (primary) hypertension: Secondary | ICD-10-CM | POA: Insufficient documentation

## 2023-01-19 DIAGNOSIS — I6523 Occlusion and stenosis of bilateral carotid arteries: Secondary | ICD-10-CM | POA: Diagnosis not present

## 2023-01-19 DIAGNOSIS — R29898 Other symptoms and signs involving the musculoskeletal system: Secondary | ICD-10-CM | POA: Diagnosis not present

## 2023-01-19 DIAGNOSIS — R6889 Other general symptoms and signs: Secondary | ICD-10-CM | POA: Diagnosis not present

## 2023-01-19 DIAGNOSIS — Z7901 Long term (current) use of anticoagulants: Secondary | ICD-10-CM | POA: Insufficient documentation

## 2023-01-19 DIAGNOSIS — Z20822 Contact with and (suspected) exposure to covid-19: Secondary | ICD-10-CM | POA: Diagnosis not present

## 2023-01-19 DIAGNOSIS — I700 Atherosclerosis of aorta: Secondary | ICD-10-CM | POA: Diagnosis not present

## 2023-01-19 LAB — URINALYSIS, W/ REFLEX TO CULTURE (INFECTION SUSPECTED)
Bacteria, UA: NONE SEEN
Bilirubin Urine: NEGATIVE
Glucose, UA: NEGATIVE mg/dL
Ketones, ur: NEGATIVE mg/dL
Leukocytes,Ua: NEGATIVE
Nitrite: NEGATIVE
Protein, ur: NEGATIVE mg/dL
Specific Gravity, Urine: 1.02 (ref 1.005–1.030)
pH: 5 (ref 5.0–8.0)

## 2023-01-19 LAB — COMPREHENSIVE METABOLIC PANEL
ALT: 8 U/L (ref 0–44)
AST: 17 U/L (ref 15–41)
Albumin: 4.2 g/dL (ref 3.5–5.0)
Alkaline Phosphatase: 51 U/L (ref 38–126)
Anion gap: 11 (ref 5–15)
BUN: 27 mg/dL — ABNORMAL HIGH (ref 8–23)
CO2: 25 mmol/L (ref 22–32)
Calcium: 9.7 mg/dL (ref 8.9–10.3)
Chloride: 102 mmol/L (ref 98–111)
Creatinine, Ser: 0.94 mg/dL (ref 0.44–1.00)
GFR, Estimated: 57 mL/min — ABNORMAL LOW (ref >60)
Glucose, Bld: 100 mg/dL — ABNORMAL HIGH (ref 70–99)
Potassium: 4 mmol/L (ref 3.5–5.1)
Sodium: 138 mmol/L (ref 135–145)
Total Bilirubin: 0.9 mg/dL (ref 0.3–1.2)
Total Protein: 6.8 g/dL (ref 6.5–8.1)

## 2023-01-19 LAB — CBC WITH DIFFERENTIAL/PLATELET
Abs Immature Granulocytes: 0.03 10*3/uL (ref 0.00–0.07)
Basophils Absolute: 0 10*3/uL (ref 0.0–0.1)
Basophils Relative: 0 %
Eosinophils Absolute: 0 10*3/uL (ref 0.0–0.5)
Eosinophils Relative: 0 %
HCT: 33.7 % — ABNORMAL LOW (ref 36.0–46.0)
Hemoglobin: 11 g/dL — ABNORMAL LOW (ref 12.0–15.0)
Immature Granulocytes: 0 %
Lymphocytes Relative: 8 %
Lymphs Abs: 0.6 10*3/uL — ABNORMAL LOW (ref 0.7–4.0)
MCH: 33.7 pg (ref 26.0–34.0)
MCHC: 32.6 g/dL (ref 30.0–36.0)
MCV: 103.4 fL — ABNORMAL HIGH (ref 80.0–100.0)
Monocytes Absolute: 0.6 10*3/uL (ref 0.1–1.0)
Monocytes Relative: 8 %
Neutro Abs: 6.7 10*3/uL (ref 1.7–7.7)
Neutrophils Relative %: 84 %
Platelets: 161 10*3/uL (ref 150–400)
RBC: 3.26 MIL/uL — ABNORMAL LOW (ref 3.87–5.11)
RDW: 13.7 % (ref 11.5–15.5)
WBC: 8 10*3/uL (ref 4.0–10.5)
nRBC: 0 % (ref 0.0–0.2)

## 2023-01-19 LAB — LACTIC ACID, PLASMA: Lactic Acid, Venous: 1.4 mmol/L (ref 0.5–1.9)

## 2023-01-19 LAB — SARS CORONAVIRUS 2 BY RT PCR: SARS Coronavirus 2 by RT PCR: NEGATIVE

## 2023-01-19 MED ORDER — SODIUM CHLORIDE 0.9 % IV BOLUS
500.0000 mL | Freq: Once | INTRAVENOUS | Status: AC
  Administered 2023-01-19: 500 mL via INTRAVENOUS

## 2023-01-19 MED ORDER — ACETAMINOPHEN 325 MG PO TABS
650.0000 mg | ORAL_TABLET | Freq: Once | ORAL | Status: AC
  Administered 2023-01-19: 650 mg via ORAL
  Filled 2023-01-19: qty 2

## 2023-01-19 MED ORDER — SODIUM CHLORIDE 0.9 % IV BOLUS
1000.0000 mL | Freq: Once | INTRAVENOUS | Status: AC
  Administered 2023-01-19: 1000 mL via INTRAVENOUS

## 2023-01-19 NOTE — ED Provider Notes (Signed)
[place] EMERGENCY DE[facility]MENT AT [facility] Provider Note   CSN: [id number] Arrival date & time: 01/19/23  1819     History  Chief Complaint  Patient presents with   Weakness    Kathy [name] is a 89 y.o. female history of A-fib on Eliquis, hypertension, here presenting with weakness.  Patient is from home.  Patient states that she went to use the restroom and her legs buckled and she fell and was unable to get up.  Patient denies any head injury.  Patient states that she is feeling weak all over.  Patient also was noted to have malodorous urine.  Denies any cough at home.  The history is provided by the patient.       Home Medications Prior to Admission medications   Medication Sig Start Date End Date Taking? Authorizing Provider  apixaban (ELIQUIS) 5 MG TABS tablet Take 1 tablet (5 mg total) by mouth 2 (two) times daily. Stop coumadin 07/16/22   Donita Brooks, MD  Calcium Carbonate-Vitamin D 600-400 MG-UNIT tablet Take 1 tablet by mouth 2 (two) times daily.    [provider]  cholecalciferol (VITAMIN D) 400 UNITS TABS Take 400 Units by mouth daily. Take in addition to Caltrate with Vitamin D to get 1200 units of Vitamin D    [provider]  COMIRNATY SUSP injection  02/23/22   [provider]  hydrochlorothiazide (HYDRODIURIL) 25 MG tablet TAKE 1 TABLET BY MOUTH DAILY 12/11/22   Donita Brooks, MD  lisinopril (ZESTRIL) 20 MG tablet TAKE 1 TABLET BY MOUTH DAILY 08/04/22   Donita Brooks, MD  metoprolol tartrate (LOPRESSOR) 50 MG tablet TAKE 1 TABLET BY MOUTH TWICE  DAILY 12/11/22   Donita Brooks, MD  oxyCODONE-acetaminophen (PERCOCET) 5-325 MG tablet Take 1 tablet by mouth every 4 (four) hours as needed for severe pain (stop hydrocodone). 12/08/21   Donita Brooks, MD  simvastatin (ZOCOR) 20 MG tablet TAKE 1 TABLET BY MOUTH AT  BEDTIME 12/22/22   Donita Brooks, MD  vitamin B-12 (CYANOCOBALAMIN) 1000 MCG tablet Take 1,000 mcg by  mouth daily.    [provider]  warfarin (COUMADIN) 5 MG tablet TAKE 1 TABLET BY MOUTH ON MONDAY,WEDNESDAY,FRIDAY,SATURDAY AND 1/2 TABLET BY MOUTH OTHER DAYS. Patient not taking: Reported on 09/14/2022 05/07/22   Donita Brooks, MD      Allergies    Patient has no known allergies.    Review of Systems   Review of Systems  Neurological:  Positive for weakness.  Psychiatric/Behavioral:  Positive for confusion.   All other systems reviewed and are negative.   Physical Exam Updated Vital Signs BP (!) 131/105 (BP Location: Right Arm)   Pulse (!) 101   Temp (!) 101.2 F (38.4 C) (Oral)   Resp 16   SpO2 95%  Physical Exam Vitals and nursing note reviewed.  Constitutional:      Comments: Chronically ill  HENT:     Head: Normocephalic.     Nose: Nose normal.     Mouth/Throat:     Mouth: Mucous membranes are dry.  Eyes:     Extraocular Movements: Extraocular movements intact.     Pupils: Pupils are equal, round, and reactive to light.  Cardiovascular:     Rate and Rhythm: Normal rate and regular rhythm.     Pulses: Normal pulses.     Heart sounds: Normal heart sounds.  Pulmonary:     Effort: Pulmonary effort is normal.  Breath sounds: Normal breath sounds.  Abdominal:     General: Abdomen is flat.     Palpations: Abdomen is soft.  Musculoskeletal:        General: Normal range of motion.     Cervical back: Normal range of motion and neck supple.  Skin:    General: Skin is warm.  Neurological:     Comments: No obvious facial droop.  Strength is 4 out of 5 bilateral arms and legs  Psychiatric:        Mood and Affect: Mood normal.        Behavior: Behavior normal.     ED Results / Procedures / Treatments   Labs (all labs ordered are listed, but only abnormal results are displayed) Labs Reviewed  SARS CORONAVIRUS 2 BY RT PCR  CULTURE, BLOOD (ROUTINE X 2)  CULTURE, BLOOD (ROUTINE X 2)  CBC WITH DIFFERENTIAL/PLATELET  COMPREHENSIVE METABOLIC PANEL   URINALYSIS, W/ REFLEX TO CULTURE (INFECTION SUSPECTED)  LACTIC ACID, PLASMA  LACTIC ACID, PLASMA    EKG None  Radiology No results found.  Procedures Procedures    Medications Ordered in ED Medications  acetaminophen (TYLENOL) tablet 650 mg (has no administration in time range)  sodium chloride 0.9 % bolus 500 mL (has no administration in time range)    ED Course/ Medical Decision Making/ A&P                                 Medical Decision Making Kathy [name] is a 89 y.o. female here presenting with weakness.  Patient is also febrile.  Consider sepsis from UTI versus pneumonia versus COVID.  Plan to get CBC and CMP and lactate and cultures and chest x-ray and COVID test and urinalysis.  10:38 PM I reviewed patient's labs and independently interpreted imaging studies.  White blood cell count is normal.  Lactate is normal.  Blood cultures are sent.  Urinalysis is normal.  COVID test is negative.  Chest x-ray did not show any pneumonia.  CT head showed possible sinusitis but patient has no discharge or sinus pain.  Patient was briefly orthostatic but after 2 L bolus, her blood pressure is up to the low 100s.  Patient is able to ambulate with walker which is baseline.  She has multiple family members who can take care of her.  I think at this point, patient has viral illness.  Will hold off on antibiotics and told her to stay hydrated.  Problems Addressed: Dehydration: acute illness or injury Viral syndrome: acute illness or injury Weakness: acute illness or injury  Amount and/or Complexity of Data Reviewed Labs: ordered. Radiology: ordered and independent interpretation performed. Decision-making details documented in ED Course. ECG/medicine tests: ordered and independent interpretation performed. Decision-making details documented in ED Course.  Risk OTC drugs.    Final Clinical Impression(s) / ED Diagnoses Final diagnoses:  None    Rx / DC Orders ED Discharge  Orders     None         Charlynne Pander, MD 01/19/23 2240

## 2023-01-19 NOTE — ED Notes (Signed)
Report given to the next RN... 

## 2023-01-19 NOTE — ED Notes (Addendum)
Pt was placed on a purwick due to bilateral severe weakness in legs, pt tolerates purwick well with no complaints at this time.

## 2023-01-19 NOTE — Discharge Instructions (Signed)
As we discussed, you likely have viral illness.  In particular your lab work and COVID test and urinalysis chest x-rays were negative.  I recommend that you stay hydrated.  Please follow-up with your doctor.  Return to ER if you have worse weakness or trouble walking or persistent fever or dehydration

## 2023-01-19 NOTE — ED Notes (Signed)
Pt aware of the need for a urine... Unable to currently provide the sample... 

## 2023-01-19 NOTE — ED Triage Notes (Signed)
BIB [facility]. From home.  New onset bilateral leg weakness today. Usually independent and active. Cannot stand from toilet. Slumped down to floor at some point- no head neck trauma. On eliquis. Endorses odorous urine, no pain.   EMS 172/96 90HR Afib 95% RA CBG 139 99.19F oral

## 2023-01-19 NOTE — ED Notes (Signed)
Patient ambulated in room with walker. Patient denied dizziness and stated her weakness felt much better.

## 2023-01-21 ENCOUNTER — Ambulatory Visit: Payer: Medicare Other | Admitting: Family Medicine

## 2023-01-21 VITALS — BP 108/64 | HR 105 | Temp 100.0°F | Ht 59.0 in | Wt 155.0 lb

## 2023-01-21 DIAGNOSIS — J323 Chronic sphenoidal sinusitis: Secondary | ICD-10-CM | POA: Diagnosis not present

## 2023-01-21 LAB — CULTURE, BLOOD (ROUTINE X 2)
Special Requests: ADEQUATE
Special Requests: ADEQUATE

## 2023-01-21 MED ORDER — AMOXICILLIN-POT CLAVULANATE 875-125 MG PO TABS: 1.0000 | ORAL_TABLET | Freq: Two times a day (BID) | ORAL | 0 refills | Status: DC

## 2023-01-21 NOTE — Progress Notes (Signed)
+    Subjective:    Patient ID: Kathy [name], female    DOB: 1927-06-27, 89 y.o.   MRN: [id number]  Cough   Patient went to emergency room on September 24 after experiencing a fall.  Since that time she has developed a cough productive of clear mucus.  She states she feels weak.  She feels dizzy.  She feels like her head weighs "a ton".  In the emergency room they did a negative COVID test.  Chest x-ray was clear.  Lab work was unremarkable.  CT scan of the brain showed no evidence of any hemorrhage or CVA but did show left sphenoid sinusitis. Past Medical History:  Diagnosis Date   Arthritis    both shoulders   Atrial fibrillation (HCC)    Breast cancer (HCC)    Cancer (HCC)    left breast   Cataracts, bilateral    Diverticulitis    Diverticulosis    Hemorrhoids    internal   Hiatal hernia    Hyperlipidemia    takes Zocor daily   Hypertension    takes Amlodipine daily   Joint pain    both shoulders   Macular degeneration 02/2003   Nasal congestion    Nocturia    Obesity    Osteoporosis    PONV (postoperative nausea and vomiting)    Prediabetes    Shortness of breath    with exertion   Sinus drainage    Urinary frequency    Urinary incontinence    Past Surgical History:  Procedure Laterality Date   bilateral knee replacements  2008   BREAST BIOPSY  05/04/2011   Procedure: BREAST BIOPSY WITH NEEDLE LOCALIZATION;  Surgeon: Rulon Abide, DO;  Location: [facility] OR;  Service: General;  Laterality: Left;   CARDIOVERSION N/A 09/15/2012   Procedure: CARDIOVERSION;  Surgeon: Pamella Pert, MD;  Location: [facility] ENDOSCOPY;  Service: Cardiovascular;  Laterality: N/A;  h&p in file-HW   COLONOSCOPY     ESOPHAGOGASTRODUODENOSCOPY     HEMORRHOID SURGERY     outpatient "done in doctor's office"   JOINT REPLACEMENT  2008   bilateral knee replacement   left breast biopsy  1984   right breast biopsy  1993   Current Outpatient Medications on File Prior to Visit  Medication Sig  Dispense Refill   apixaban (ELIQUIS) 5 MG TABS tablet Take 1 tablet (5 mg total) by mouth 2 (two) times daily. Stop coumadin 180 tablet 3   Calcium Carbonate-Vitamin D 600-400 MG-UNIT tablet Take 1 tablet by mouth 2 (two) times daily.     cholecalciferol (VITAMIN D) 400 UNITS TABS Take 400 Units by mouth daily. Take in addition to Caltrate with Vitamin D to get 1200 units of Vitamin D     COMIRNATY SUSP injection      hydrochlorothiazide (HYDRODIURIL) 25 MG tablet TAKE 1 TABLET BY MOUTH DAILY 100 tablet 0   lisinopril (ZESTRIL) 20 MG tablet TAKE 1 TABLET BY MOUTH DAILY 90 tablet 3   metoprolol tartrate (LOPRESSOR) 50 MG tablet TAKE 1 TABLET BY MOUTH TWICE  DAILY 200 tablet 0   oxyCODONE-acetaminophen (PERCOCET) 5-325 MG tablet Take 1 tablet by mouth every 4 (four) hours as needed for severe pain (stop hydrocodone). 30 tablet 0   simvastatin (ZOCOR) 20 MG tablet TAKE 1 TABLET BY MOUTH AT  BEDTIME 100 tablet 1   vitamin B-12 (CYANOCOBALAMIN) 1000 MCG tablet Take 1,000 mcg by mouth daily.     warfarin (COUMADIN) 5 MG tablet TAKE  1 TABLET BY MOUTH ON MONDAY,WEDNESDAY,FRIDAY,SATURDAY AND 1/2 TABLET BY MOUTH OTHER DAYS. 60 tablet 0   No current facility-administered medications on file prior to visit.   Marland Kitchenall Social History   Socioeconomic History   Marital status: Widowed    Spouse name: Not on file   Number of children: 0   Years of education: Not on file   Highest education level: Not on file  Occupational History   Occupation: retired  Tobacco Use   Smoking status: Former    Current packs/day: 0.00    Average packs/day: 0.3 packs/day for 10.0 years (2.5 ttl pk-yrs)    Types: Cigarettes    Start date: 05/20/1961    Quit date: 05/21/1971    Years since quitting: 51.7   Smokeless tobacco: Never   Tobacco comments:    quit in the 60's  Substance and Sexual Activity   Alcohol use: No   Drug use: No   Sexual activity: Not Currently  Other Topics Concern   Not on file  Social History  Narrative   Worked in Designer, fashion/clothing.    Widowed since 2014.    No children but nephew, Reita Cliche and his wife Olegario Messier, care for patient.    Lives alone.    Social Determinants of Health   Financial Resource Strain: Low Risk  (07/02/2022)   Overall Financial Resource Strain (CARDIA)    Difficulty of Paying Living Expenses: Not hard at all  Food Insecurity: No Food Insecurity (07/02/2022)   Hunger Vital Sign    Worried About Running Out of Food in the Last Year: Never true    Ran Out of Food in the Last Year: Never true  Transportation Needs: No Transportation Needs (07/02/2022)   PRAPARE - Administrator, Civil Service (Medical): No    Lack of Transportation (Non-Medical): No  Physical Activity: Inactive (07/02/2022)   Exercise Vital Sign    Days of Exercise per Week: 0 days    Minutes of Exercise per Session: 0 min  Stress: No Stress Concern Present (07/02/2022)   [employer] of Occupational Health - Occupational Stress Questionnaire    Feeling of Stress : Not at all  Social Connections: Socially Isolated (07/02/2022)   Social Connection and Isolation Panel [NHANES]    Frequency of Communication with Friends and Family: Never    Frequency of Social Gatherings with Friends and Family: Once a week    Attends Religious Services: More than 4 times per year    Active Member of [employer] or Organizations: No    Attends Banker Meetings: Never    Marital Status: Widowed  Intimate Partner Violence: Not At Risk (07/02/2022)   Humiliation, Afraid, Rape, and Kick questionnaire    Fear of Current or Ex-Partner: No    Emotionally Abused: No    Physically Abused: No    Sexually Abused: No     Review of Systems  Respiratory:  Positive for cough.   All other systems reviewed and are negative.      Objective:   Physical Exam Constitutional:      General: She is not in acute distress.    Appearance: Normal appearance. She is normal weight. She is not ill-appearing or  toxic-appearing.  HENT:     Right Ear: Tympanic membrane and ear canal normal.     Left Ear: Tympanic membrane and ear canal normal.     Nose: Congestion and rhinorrhea present.     Right Sinus: No maxillary sinus tenderness or frontal  sinus tenderness.     Left Sinus: No maxillary sinus tenderness or frontal sinus tenderness.     Mouth/Throat:     Pharynx: No oropharyngeal exudate or posterior oropharyngeal erythema.  Eyes:     Conjunctiva/sclera: Conjunctivae normal.     Pupils: Pupils are equal, round, and reactive to light.  Cardiovascular:     Rate and Rhythm: Normal rate. Rhythm irregular.     Heart sounds: Normal heart sounds.  Pulmonary:     Effort: Pulmonary effort is normal. No respiratory distress.     Breath sounds: No wheezing, rhonchi or rales.  Musculoskeletal:        General: No swelling or deformity.     Right lower leg: No edema.     Left lower leg: No edema.  Skin:    Findings: No bruising or erythema.  Neurological:     General: No focal deficit present.     Mental Status: She is alert and oriented to person, place, and time.  Psychiatric:        Mood and Affect: Mood normal.        Behavior: Behavior normal.        Thought Content: Thought content normal.           Assessment & Plan:  Sphenoid sinusitis, unspecified chronicity Believe the patient is dealing with sinusitis.  Begin Augmentin 875 mg twice daily for 10 days and reassess if not better.  Recheck immediately if worsening

## 2023-01-24 LAB — CULTURE, BLOOD (ROUTINE X 2)
Culture: NO GROWTH
Culture: NO GROWTH

## 2023-01-26 ENCOUNTER — Telehealth: Payer: Self-pay | Admitting: Family Medicine

## 2023-01-26 ENCOUNTER — Other Ambulatory Visit: Payer: Self-pay | Admitting: Family Medicine

## 2023-01-26 MED ORDER — NIRMATRELVIR/RITONAVIR (PAXLOVID) TABLET (RENAL DOSING): 2.0000 | ORAL_TABLET | Freq: Two times a day (BID) | ORAL | 0 refills | Status: AC

## 2023-01-26 NOTE — Telephone Encounter (Signed)
Patient's niece Olegario Messier called to report patient tested positive for COVID this morning.   Patient still dealing with sinus drainage; reportedly she doesn't feel bad at the time and she's still taking the antibiotics she was prescribed last week.  Olegario Messier is concerned due to patient's medical history and other health issues.  Requesting call back at [phone].

## 2023-02-19 ENCOUNTER — Other Ambulatory Visit: Payer: Self-pay | Admitting: Family Medicine

## 2023-02-19 NOTE — Telephone Encounter (Signed)
Requested Prescriptions  Pending Prescriptions Disp Refills   hydrochlorothiazide (HYDRODIURIL) 25 MG tablet [Pharmacy Med Name: hydroCHLOROthiazide 25 MG Oral Tablet] 100 tablet 0    Sig: TAKE 1 TABLET BY MOUTH DAILY     Cardiovascular: Diuretics - Thiazide Failed - 02/19/2023  1:18 AM      Failed - Valid encounter within last 6 months    Recent Outpatient Visits           1 year ago Chronic atrial fibrillation (HCC)   [facility] Pickard, Priscille Heidelberg, MD   1 year ago Chronic atrial fibrillation (HCC)   [facility] Pickard, Priscille Heidelberg, MD   1 year ago Chronic atrial fibrillation (HCC)   [facility] Pickard, Priscille Heidelberg, MD   1 year ago Confusion   [place] [facility] Donita Brooks, MD   2 years ago Chronic atrial fibrillation [facility])   [facility] Pickard, Priscille Heidelberg, MD       Future Appointments             In 3 weeks Pickard, Priscille Heidelberg, MD [facility]            Passed - Cr in normal range and within 180 days    Creatinine  Date Value Ref Range Status  12/05/2015 0.9 0.6 - 1.1 mg/dL Final   Creat  Date Value Ref Range Status  09/14/2022 1.11 (H) 0.60 - 0.95 mg/dL Final   Creatinine, Ser  Date Value Ref Range Status  01/19/2023 0.94 0.44 - 1.00 mg/dL Final         Passed - K in normal range and within 180 days    Potassium  Date Value Ref Range Status  01/19/2023 4.0 3.5 - 5.1 mmol/L Final  12/05/2015 4.2 3.5 - 5.1 mEq/L Final         Passed - Na in normal range and within 180 days    Sodium  Date Value Ref Range Status  01/19/2023 138 135 - 145 mmol/L Final  12/05/2015 140 136 - 145 mEq/L Final         Passed - Last BP in normal range    BP Readings from Last 1 Encounters:  01/21/23 108/64

## 2023-02-22 ENCOUNTER — Telehealth: Payer: Self-pay

## 2023-02-22 NOTE — Telephone Encounter (Signed)
Transition Care Management Unsuccessful Follow-up Telephone Call  Date of discharge and from where:  01/19/2023 [facility]  Attempts:  2nd Attempt  Reason for unsuccessful TCM follow-up call:  No answer/busy  Maurica Omura [facility]  [facility] Guide Direct Dial: [phone]  Website: [email or web address]

## 2023-02-22 NOTE — Telephone Encounter (Signed)
Transition Care Management Unsuccessful Follow-up Telephone Call  Date of discharge and from where:  01/19/2023 [facility]  Attempts:  1st Attempt  Reason for unsuccessful TCM follow-up call:  No answer/busy  Evamaria Detore [facility]  [facility] Guide Direct Dial: [phone]  Website: [email or web address]

## 2023-02-25 ENCOUNTER — Other Ambulatory Visit: Payer: Self-pay | Admitting: Family Medicine

## 2023-02-26 NOTE — Telephone Encounter (Signed)
Requested Prescriptions  Pending Prescriptions Disp Refills   metoprolol tartrate (LOPRESSOR) 50 MG tablet [Pharmacy Med Name: Metoprolol Tartrate 50 MG Oral Tablet] 200 tablet 0    Sig: TAKE 1 TABLET BY MOUTH TWICE  DAILY     Cardiovascular:  Beta Blockers Failed - 02/25/2023 10:37 PM      Failed - Valid encounter within last 6 months    Recent Outpatient Visits           1 year ago Chronic atrial fibrillation (HCC)   [facility] Pickard, Priscille Heidelberg, MD   1 year ago Chronic atrial fibrillation (HCC)   [facility] Pickard, Priscille Heidelberg, MD   1 year ago Chronic atrial fibrillation [facility])   [facility] Pickard, Priscille Heidelberg, MD   1 year ago Confusion   [facility] Donita Brooks, MD   2 years ago Chronic atrial fibrillation [facility])   [facility] Pickard, Priscille Heidelberg, MD       Future Appointments             In 2 weeks Pickard, Priscille Heidelberg, MD [facility]            Passed - Last BP in normal range    BP Readings from Last 1 Encounters:  01/21/23 108/64         Passed - Last Heart Rate in normal range    Pulse Readings from Last 1 Encounters:  01/21/23 (!) 105

## 2023-03-15 ENCOUNTER — Encounter: Payer: Self-pay | Admitting: Family Medicine

## 2023-03-15 ENCOUNTER — Ambulatory Visit (INDEPENDENT_AMBULATORY_CARE_PROVIDER_SITE_OTHER): Payer: Medicare Other | Admitting: Family Medicine

## 2023-03-15 VITALS — BP 118/74 | HR 92 | Temp 98.0°F | Ht 59.0 in | Wt 151.2 lb

## 2023-03-15 DIAGNOSIS — Z23 Encounter for immunization: Secondary | ICD-10-CM | POA: Diagnosis not present

## 2023-03-15 DIAGNOSIS — I4821 Permanent atrial fibrillation: Secondary | ICD-10-CM

## 2023-03-15 MED ORDER — METOPROLOL TARTRATE 50 MG PO TABS: 50.0000 mg | ORAL_TABLET | Freq: Two times a day (BID) | ORAL | 3 refills | Status: DC

## 2023-03-15 MED ORDER — SIMVASTATIN 20 MG PO TABS: 20.0000 mg | ORAL_TABLET | Freq: Every day | ORAL | 3 refills | Status: DC

## 2023-03-15 MED ORDER — HYDROCHLOROTHIAZIDE 25 MG PO TABS: 25.0000 mg | ORAL_TABLET | Freq: Every day | ORAL | 3 refills | Status: AC

## 2023-03-15 MED ORDER — LISINOPRIL 20 MG PO TABS: 20.0000 mg | ORAL_TABLET | Freq: Every day | ORAL | 3 refills | Status: DC

## 2023-03-15 MED ORDER — APIXABAN 5 MG PO TABS: 5.0000 mg | ORAL_TABLET | Freq: Two times a day (BID) | ORAL | 3 refills | Status: DC

## 2023-03-15 NOTE — Progress Notes (Signed)
+    Subjective:    Patient ID: Kathy [name], female    DOB: 1927-10-09, 89 y.o.   MRN: [id number]  HPI Patient very sweet 89-year Caucasian female here today for a check up.  She denies any bleeding or bruising on Eliquis.  She denies any melena or hematochezia.  She denies any epistaxis.  Today she is in atrial fibrillation but her rate is controlled.  She denies any chest pain shortness of breath or dyspnea on exertion.  She denies any syncope.  Overall, the patient states she feels good.  She did have COVID in September but she recovered from this.  She is due for a flu shot.  She denies any falls.  She denies any memory loss.  She denies any depression.  Past Medical History:  Diagnosis Date   Arthritis    both shoulders   Atrial fibrillation (HCC)    Breast cancer (HCC)    Cancer (HCC)    left breast   Cataracts, bilateral    Diverticulitis    Diverticulosis    Hemorrhoids    internal   Hiatal hernia    Hyperlipidemia    takes Zocor daily   Hypertension    takes Amlodipine daily   Joint pain    both shoulders   Macular degeneration 02/2003   Nasal congestion    Nocturia    Obesity    Osteoporosis    PONV (postoperative nausea and vomiting)    Prediabetes    Shortness of breath    with exertion   Sinus drainage    Urinary frequency    Urinary incontinence    Past Surgical History:  Procedure Laterality Date   bilateral knee replacements  2008   BREAST BIOPSY  05/04/2011   Procedure: BREAST BIOPSY WITH NEEDLE LOCALIZATION;  Surgeon: Rulon Abide, DO;  Location: [facility] OR;  Service: General;  Laterality: Left;   CARDIOVERSION N/A 09/15/2012   Procedure: CARDIOVERSION;  Surgeon: Pamella Pert, MD;  Location: [facility] ENDOSCOPY;  Service: Cardiovascular;  Laterality: N/A;  h&p in file-HW   COLONOSCOPY     ESOPHAGOGASTRODUODENOSCOPY     HEMORRHOID SURGERY     outpatient "done in doctor's office"   JOINT REPLACEMENT  2008   bilateral knee replacement   left breast  biopsy  1984   right breast biopsy  1993   Current Outpatient Medications on File Prior to Visit  Medication Sig Dispense Refill   amoxicillin-clavulanate (AUGMENTIN) 875-125 MG tablet Take 1 tablet by mouth 2 (two) times daily. 20 tablet 0   apixaban (ELIQUIS) 5 MG TABS tablet Take 1 tablet (5 mg total) by mouth 2 (two) times daily. Stop coumadin 180 tablet 3   Calcium Carbonate-Vitamin D 600-400 MG-UNIT tablet Take 1 tablet by mouth 2 (two) times daily.     cholecalciferol (VITAMIN D) 400 UNITS TABS Take 400 Units by mouth daily. Take in addition to Caltrate with Vitamin D to get 1200 units of Vitamin D     COMIRNATY SUSP injection      hydrochlorothiazide (HYDRODIURIL) 25 MG tablet TAKE 1 TABLET BY MOUTH DAILY 100 tablet 0   lisinopril (ZESTRIL) 20 MG tablet TAKE 1 TABLET BY MOUTH DAILY 90 tablet 3   metoprolol tartrate (LOPRESSOR) 50 MG tablet TAKE 1 TABLET BY MOUTH TWICE  DAILY 200 tablet 0   oxyCODONE-acetaminophen (PERCOCET) 5-325 MG tablet Take 1 tablet by mouth every 4 (four) hours as needed for severe pain (stop hydrocodone). 30 tablet 0  simvastatin (ZOCOR) 20 MG tablet TAKE 1 TABLET BY MOUTH AT  BEDTIME 100 tablet 1   vitamin B-12 (CYANOCOBALAMIN) 1000 MCG tablet Take 1,000 mcg by mouth daily.     warfarin (COUMADIN) 5 MG tablet TAKE 1 TABLET BY MOUTH ON MONDAY,WEDNESDAY,FRIDAY,SATURDAY AND 1/2 TABLET BY MOUTH OTHER DAYS. 60 tablet 0   No current facility-administered medications on file prior to visit.   Marland Kitchenall Social History   Socioeconomic History   Marital status: Widowed    Spouse name: Not on file   Number of children: 0   Years of education: Not on file   Highest education level: Not on file  Occupational History   Occupation: retired  Tobacco Use   Smoking status: Former    Current packs/day: 0.00    Average packs/day: 0.3 packs/day for 10.0 years (2.5 ttl pk-yrs)    Types: Cigarettes    Start date: 05/20/1961    Quit date: 05/21/1971    Years since quitting:  51.8   Smokeless tobacco: Never   Tobacco comments:    quit in the 60's  Substance and Sexual Activity   Alcohol use: No   Drug use: No   Sexual activity: Not Currently  Other Topics Concern   Not on file  Social History Narrative   Worked in Designer, fashion/clothing.    Widowed since 2014.    No children but nephew, Reita Cliche and his wife Olegario Messier, care for patient.    Lives alone.    Social Determinants of Health   Financial Resource Strain: Low Risk  (07/02/2022)   Overall Financial Resource Strain (CARDIA)    Difficulty of Paying Living Expenses: Not hard at all  Food Insecurity: No Food Insecurity (07/02/2022)   Hunger Vital Sign    Worried About Running Out of Food in the Last Year: Never true    Ran Out of Food in the Last Year: Never true  Transportation Needs: No Transportation Needs (07/02/2022)   PRAPARE - Administrator, Civil Service (Medical): No    Lack of Transportation (Non-Medical): No  Physical Activity: Inactive (07/02/2022)   Exercise Vital Sign    Days of Exercise per Week: 0 days    Minutes of Exercise per Session: 0 min  Stress: No Stress Concern Present (07/02/2022)   [employer] of Occupational Health - Occupational Stress Questionnaire    Feeling of Stress : Not at all  Social Connections: Socially Isolated (07/02/2022)   Social Connection and Isolation Panel [NHANES]    Frequency of Communication with Friends and Family: Never    Frequency of Social Gatherings with Friends and Family: Once a week    Attends Religious Services: More than 4 times per year    Active Member of [employer] or Organizations: No    Attends Banker Meetings: Never    Marital Status: Widowed  Intimate Partner Violence: Not At Risk (07/02/2022)   Humiliation, Afraid, Rape, and Kick questionnaire    Fear of Current or Ex-Partner: No    Emotionally Abused: No    Physically Abused: No    Sexually Abused: No     Review of Systems  All other systems reviewed and are  negative.      Objective:   Physical Exam Constitutional:      General: She is not in acute distress.    Appearance: Normal appearance. She is normal weight. She is not ill-appearing or toxic-appearing.  Cardiovascular:     Rate and Rhythm: Normal rate. Rhythm irregular.  Heart sounds: Normal heart sounds.  Pulmonary:     Effort: Pulmonary effort is normal. No respiratory distress.     Breath sounds: No wheezing, rhonchi or rales.  Musculoskeletal:        General: No swelling or deformity.     Right lower leg: No edema.     Left lower leg: No edema.  Skin:    Findings: No bruising or erythema.  Neurological:     General: No focal deficit present.     Mental Status: She is alert and oriented to person, place, and time.  Psychiatric:        Mood and Affect: Mood normal.        Behavior: Behavior normal.        Thought Content: Thought content normal.           Assessment & Plan:  Permanent atrial fibrillation (HCC) - Plan: CBC with Differential/Platelet, COMPLETE METABOLIC PANEL WITH GFR  Needs flu shot - Plan: Flu Vaccine Trivalent High Dose (Fluad) Patient's blood pressure is outstanding.  Overall she doing remarkably well.  Her rate is trawled is appropriate anticoagulated.  We will check a CBC and a CMP today to monitor her blood count and electrolytes.  She received her flu shot today.  Recheck in 6 months or as needed.

## 2023-03-16 LAB — CBC WITH DIFFERENTIAL/PLATELET
Absolute Lymphocytes: 3192 {cells}/uL (ref 850–3900)
Absolute Monocytes: 616 {cells}/uL (ref 200–950)
Basophils Absolute: 53 {cells}/uL (ref 0–200)
Basophils Relative: 0.7 %
Eosinophils Absolute: 129 {cells}/uL (ref 15–500)
Eosinophils Relative: 1.7 %
HCT: 35.7 % (ref 35.0–45.0)
Hemoglobin: 11.6 g/dL — ABNORMAL LOW (ref 11.7–15.5)
MCH: 34 pg — ABNORMAL HIGH (ref 27.0–33.0)
MCHC: 32.5 g/dL (ref 32.0–36.0)
MCV: 104.7 fL — ABNORMAL HIGH (ref 80.0–100.0)
MPV: 10.8 fL (ref 7.5–12.5)
Monocytes Relative: 8.1 %
Neutro Abs: 3610 {cells}/uL (ref 1500–7800)
Neutrophils Relative %: 47.5 %
Platelets: 205 10*3/uL (ref 140–400)
RBC: 3.41 10*6/uL — ABNORMAL LOW (ref 3.80–5.10)
RDW: 12.9 % (ref 11.0–15.0)
Total Lymphocyte: 42 %
WBC: 7.6 10*3/uL (ref 3.8–10.8)

## 2023-03-16 LAB — COMPLETE METABOLIC PANEL WITH GFR
AG Ratio: 1.6 (calc) (ref 1.0–2.5)
ALT: 9 U/L (ref 6–29)
AST: 13 U/L (ref 10–35)
Albumin: 4.2 g/dL (ref 3.6–5.1)
Alkaline phosphatase (APISO): 56 U/L (ref 37–153)
BUN/Creatinine Ratio: 34 (calc) — ABNORMAL HIGH (ref 6–22)
BUN: 34 mg/dL — ABNORMAL HIGH (ref 7–25)
CO2: 25 mmol/L (ref 20–32)
Calcium: 10 mg/dL (ref 8.6–10.4)
Chloride: 104 mmol/L (ref 98–110)
Creat: 1.01 mg/dL — ABNORMAL HIGH (ref 0.60–0.95)
Globulin: 2.6 g/dL (ref 1.9–3.7)
Glucose, Bld: 86 mg/dL (ref 65–99)
Potassium: 4.6 mmol/L (ref 3.5–5.3)
Sodium: 140 mmol/L (ref 135–146)
Total Bilirubin: 0.7 mg/dL (ref 0.2–1.2)
Total Protein: 6.8 g/dL (ref 6.1–8.1)
eGFR: 52 mL/min/{1.73_m2} — ABNORMAL LOW (ref >=60)

## 2023-04-05 DIAGNOSIS — Z1231 Encounter for screening mammogram for malignant neoplasm of breast: Secondary | ICD-10-CM | POA: Diagnosis not present

## 2023-04-05 LAB — HM MAMMOGRAPHY

## 2023-04-06 ENCOUNTER — Encounter: Payer: Self-pay | Admitting: Family Medicine

## 2023-04-23 DIAGNOSIS — N6311 Unspecified lump in the right breast, upper outer quadrant: Secondary | ICD-10-CM | POA: Diagnosis not present

## 2023-04-26 ENCOUNTER — Encounter: Payer: Self-pay | Admitting: Family Medicine

## 2023-07-05 ENCOUNTER — Encounter: Payer: Self-pay | Admitting: Family Medicine

## 2023-07-05 ENCOUNTER — Ambulatory Visit (INDEPENDENT_AMBULATORY_CARE_PROVIDER_SITE_OTHER): Payer: Medicare Other | Admitting: Family Medicine

## 2023-07-05 VITALS — BP 120/72 | HR 90 | Temp 98.4°F | Ht 59.0 in | Wt 158.4 lb

## 2023-07-05 DIAGNOSIS — Z0000 Encounter for general adult medical examination without abnormal findings: Secondary | ICD-10-CM | POA: Diagnosis not present

## 2023-07-05 NOTE — Progress Notes (Signed)
 Subjective:   Kathy [name] is a 89 y.o. female who presents for Medicare Annual (Subsequent) preventive examination.  Visit Complete: In person  Patient Medicare AWV questionnaire was completed by the patient on 07/05/2023 ; I have confirmed that all information answered by patient is correct and no changes since this date.  Cardiac Risk Factors include: advanced age (>11men, >50 women);hypertension;family history of premature cardiovascular disease;dyslipidemia;obesity (BMI >30kg/m2);sedentary lifestyle     Objective:    Today's Vitals   07/05/23 1401 07/05/23 1403  BP: 120/72   Pulse: 90   Temp: 98.4 F (36.9 C)   SpO2: 99%   Weight: 158 lb 6.4 oz (71.8 kg)   Height: 4\' 11"  (1.499 m)   PainSc:  5    Body mass index is 31.99 kg/m.     07/05/2023    2:30 PM 07/05/2023    2:12 PM 01/19/2023    6:28 PM 07/02/2022    2:17 PM 05/23/2021    2:15 PM 12/12/2015    6:18 PM 12/12/2015    2:08 PM  Advanced Directives  Does Patient Have a Medical Advance Directive? Yes No Yes Yes Yes No No  Type of Estate agent of [place];Out of facility DNR (pink MOST or yellow form)  Out of facility DNR (pink MOST or yellow form) Out of facility DNR (pink MOST or yellow form) Healthcare Power of [place];Living will    Does patient want to make changes to medical advance directive? Yes (Inpatient - patient defers changing a medical advance directive at this time - Information given)   No - Patient declined     Copy of Healthcare Power of Attorney in Chart?     Yes - validated most recent copy scanned in chart (See row information)    Would patient like information on creating a medical advance directive?  Yes (MAU/Ambulatory/Procedural Areas - Information given)         Current Medications (verified) Outpatient Encounter Medications as of 07/05/2023  Medication Sig   apixaban (ELIQUIS) 5 MG TABS tablet Take 1 tablet (5 mg total) by mouth 2 (two) times daily. Stop coumadin    Calcium Carbonate-Vitamin D 600-400 MG-UNIT tablet Take 1 tablet by mouth 2 (two) times daily.   cholecalciferol (VITAMIN D) 400 UNITS TABS Take 400 Units by mouth daily. Take in addition to Caltrate with Vitamin D to get 1200 units of Vitamin D   COMIRNATY SUSP injection    hydrochlorothiazide (HYDRODIURIL) 25 MG tablet Take 1 tablet (25 mg total) by mouth daily.   lisinopril (ZESTRIL) 20 MG tablet Take 1 tablet (20 mg total) by mouth daily.   metoprolol tartrate (LOPRESSOR) 50 MG tablet Take 1 tablet (50 mg total) by mouth 2 (two) times daily.   oxyCODONE-acetaminophen (PERCOCET) 5-325 MG tablet Take 1 tablet by mouth every 4 (four) hours as needed for severe pain (stop hydrocodone).   simvastatin (ZOCOR) 20 MG tablet Take 1 tablet (20 mg total) by mouth at bedtime.   vitamin B-12 (CYANOCOBALAMIN) 1000 MCG tablet Take 1,000 mcg by mouth daily.   [DISCONTINUED] amoxicillin-clavulanate (AUGMENTIN) 875-125 MG tablet Take 1 tablet by mouth 2 (two) times daily. (Patient not taking: Reported on 07/05/2023)   No facility-administered encounter medications on file as of 07/05/2023.    Allergies (verified) Patient has no known allergies.   History: Past Medical History:  Diagnosis Date   Arthritis    both shoulders   Atrial fibrillation (HCC)    Breast cancer (HCC)  Cancer [facility])    left breast   Cataracts, bilateral    Diverticulitis    Diverticulosis    Hemorrhoids    internal   Hiatal hernia    Hyperlipidemia    takes Zocor daily   Hypertension    takes Amlodipine daily   Joint pain    both shoulders   Macular degeneration 02/2003   Nasal congestion    Nocturia    Obesity    Osteoporosis    PONV (postoperative nausea and vomiting)    Prediabetes    Shortness of breath    with exertion   Sinus drainage    Urinary frequency    Urinary incontinence    Past Surgical History:  Procedure Laterality Date   bilateral knee replacements  2008   BREAST BIOPSY  05/04/2011    Procedure: BREAST BIOPSY WITH NEEDLE LOCALIZATION;  Surgeon: Rulon Abide, DO;  Location: [facility] OR;  Service: General;  Laterality: Left;   CARDIOVERSION N/A 09/15/2012   Procedure: CARDIOVERSION;  Surgeon: Pamella Pert, MD;  Location: [facility] ENDOSCOPY;  Service: Cardiovascular;  Laterality: N/A;  h&p in file-HW   COLONOSCOPY     ESOPHAGOGASTRODUODENOSCOPY     HEMORRHOID SURGERY     outpatient "done in doctor's office"   JOINT REPLACEMENT  2008   bilateral knee replacement   left breast biopsy  1984   right breast biopsy  1993   Family History  Problem Relation Age of Onset   Cancer Mother        pancreatic   Cancer Father        esophageal   Cancer Sister        ovarian   Cancer Sister        lung   Cancer Brother        kidney   Cancer Sister        breast cancer   Heart disease Brother    COPD Brother    Cancer Brother        Kidney/Nephrectomy   Anesthesia problems Neg Hx    Hypotension Neg Hx    Malignant hyperthermia Neg Hx    Pseudochol deficiency Neg Hx    Social History   Socioeconomic History   Marital status: Widowed    Spouse name: Not on file   Number of children: 0   Years of education: Not on file   Highest education level: Not on file  Occupational History   Occupation: retired  Tobacco Use   Smoking status: Former    Current packs/day: 0.00    Average packs/day: 0.3 packs/day for 10.0 years (2.5 ttl pk-yrs)    Types: Cigarettes    Start date: 05/20/1961    Quit date: 05/21/1971    Years since quitting: 52.1   Smokeless tobacco: Never   Tobacco comments:    quit in the 60's  Substance and Sexual Activity   Alcohol use: No   Drug use: No   Sexual activity: Not Currently  Other Topics Concern   Not on file  Social History Narrative   Worked in Designer, fashion/clothing.    Widowed since 2014.    No children but nephew, Reita Cliche and his wife Olegario Messier, care for patient.    Lives alone.    Social Drivers of Corporate investment banker Strain: Low Risk   (07/05/2023)   Overall Financial Resource Strain (CARDIA)    Difficulty of Paying Living Expenses: Not hard at all  Food Insecurity: No Food Insecurity (07/05/2023)  Hunger Vital Sign    Worried About Running Out of Food in the Last Year: Never true    Ran Out of Food in the Last Year: Never true  Transportation Needs: No Transportation Needs (07/05/2023)   PRAPARE - Administrator, Civil Service (Medical): No    Lack of Transportation (Non-Medical): No  Physical Activity: Insufficiently Active (07/05/2023)   Exercise Vital Sign    Days of Exercise per Week: 5 days    Minutes of Exercise per Session: 10 min  Stress: No Stress Concern Present (07/05/2023)   [employer] of Occupational Health - Occupational Stress Questionnaire    Feeling of Stress : Not at all  Social Connections: Moderately Integrated (07/05/2023)   Social Connection and Isolation Panel [NHANES]    Frequency of Communication with Friends and Family: More than three times a week    Frequency of Social Gatherings with Friends and Family: More than three times a week    Attends Religious Services: More than 4 times per year    Active Member of [employer] or Organizations: Yes    Attends Banker Meetings: More than 4 times per year    Marital Status: Widowed    Tobacco Counseling Counseling given: Not Answered Tobacco comments: quit in the 60's   Clinical Intake:  Pre-visit preparation completed: Yes  Pain : 0-10 Pain Score: 5  Pain Type: Chronic pain Pain Location: Shoulder Pain Orientation: Right Pain Descriptors / Indicators: Aching Pain Onset: More than a month ago Pain Frequency: Intermittent     BMI - recorded: 31.99 Nutritional Status: BMI > 30  Obese Nutritional Risks: None Diabetes: No  How often do you need to have someone help you when you read instructions, pamphlets, or other written materials from your doctor or pharmacy?: 1 - Never  Interpreter Needed?:  No  Information entered by :: mj perdue, lpn   Activities of Daily Living    07/05/2023    2:12 PM  In your present state of health, do you have any difficulty performing the following activities:  Hearing? 0  Vision? 0  Difficulty concentrating or making decisions? 0  Walking or climbing stairs? 0  Dressing or bathing? 0  Doing errands, shopping? 1  Comment Diplomatic Services operational officer and eating ? N  Using the Toilet? N  In the past six months, have you accidently leaked urine? N  Do you have problems with loss of bowel control? N  Managing your Medications? N  Managing your Finances? N  Housekeeping or managing your Housekeeping? N    Patient Care Team: Donita Brooks, MD as PCP - General (Family Medicine)  Indicate any recent Medical Services you may have received from other than [facility] providers in the past year (date may be approximate).     Assessment:   This is a routine wellness examination for [place].  Hearing/Vision screen Hearing Screening - Comments:: No hearing issues Vision Screening - Comments:: Readers. [facility].    Goals Addressed             This Visit's Progress    Exercise 3x per week (30 min per time)       Pt has started doing chair exercises.        Depression Screen    07/05/2023    2:08 PM 03/15/2023    2:07 PM 07/02/2022    2:12 PM 06/15/2022    2:02 PM 04/13/2022    2:06 PM 05/23/2021  2:10 PM 08/12/2020    2:11 PM  PHQ 2/9 Scores  PHQ - 2 Score 0 0 0 0 0 0 0    Fall Risk    07/05/2023    2:12 PM 03/15/2023    2:07 PM 06/15/2022    2:02 PM 04/13/2022    2:06 PM 05/23/2021    2:16 PM  Fall Risk   Falls in the past year? 1 1 0 0 1  Number falls in past yr: 0 0 0 0 0  Injury with Fall? 0 0 0 0 0  Risk for fall due to : Impaired balance/gait;Impaired mobility  No Fall Risks No Fall Risks Impaired mobility;History of fall(s);Impaired balance/gait  Follow up Falls prevention discussed;Falls evaluation completed   Falls prevention discussed Falls prevention discussed Falls prevention discussed    MEDICARE RISK AT HOME:    TIMED UP AND GO:  Was the test performed?  Yes  Length of time to ambulate 10 feet: 10 sec Gait slow and steady with assistive device    Cognitive Function:        07/05/2023    2:13 PM 07/02/2022    2:20 PM 05/23/2021    2:21 PM  6CIT Screen  What Year? 0 points 0 points 0 points  What month? 0 points 0 points 0 points  What time? 0 points 0 points 0 points  Count back from 20 0 points 0 points 0 points  Months in reverse 0 points 0 points 2 points  Repeat phrase 2 points 0 points 0 points  Total Score 2 points 0 points 2 points    Immunizations Immunization History  Administered Date(s) Administered   Fluad Quad(high Dose 65+) 02/07/2019, 01/08/2020, 02/17/2021, 02/09/2022   Fluad Trivalent(High Dose 65+) 03/15/2023   Influenza Split 02/09/2012   Influenza, High Dose Seasonal PF 01/28/2017   Influenza,inj,Quad PF,6+ Mos 01/25/2015, 12/27/2015, 12/30/2017   Moderna Sars-Covid-2 Vaccination 05/23/2019, 06/20/2019   Pneumococcal Conjugate-13 09/29/2019   Pneumococcal Polysaccharide-23 09/11/1996   Td 09/11/1996    TDAP status: Up to date  Flu Vaccine status: Up to date  Pneumococcal vaccine status: Up to date  Covid-19 vaccine status: Completed vaccines  Qualifies for Shingles Vaccine? Yes   Zostavax completed No   Shingrix Completed?: No.    Education has been provided regarding the importance of this vaccine. Patient has been advised to call insurance company to determine out of pocket expense if they have not yet received this vaccine. Advised may also receive vaccine at local pharmacy or Health Dept. Verbalized acceptance and understanding.  Screening Tests Health Maintenance  Topic Date Due   COVID-19 Vaccine (3 - Moderna risk series) 07/21/2023 (Originally 07/18/2019)   Zoster Vaccines- Shingrix (1 of 2) 10/05/2023 (Originally 04/22/1947)    MAMMOGRAM  04/04/2024   Medicare Annual Wellness (AWV)  07/04/2024   Pneumonia Vaccine 70+ Years old  Completed   INFLUENZA VACCINE  Completed   DEXA SCAN  Completed   HPV VACCINES  Aged Out   DTaP/Tdap/Td  Discontinued    Health Maintenance  There are no preventive care reminders to display for this patient.   Colorectal cancer screening: No longer required.   Mammogram status: No longer required due to age.  Bone Density status: Ordered NO LONGER REQUIRED DUE TO AGE. Pt provided with contact info and advised to call to schedule appt.  Lung Cancer Screening: (Low Dose CT Chest recommended if Age 68-80 years, 20 pack-year currently smoking OR have quit w/in 15years.) does not qualify.  Lung Cancer Screening Referral: N/A  Additional Screening:  Hepatitis C Screening: does not qualify; Completed    Vision Screening: Recommended annual ophthalmology exams for early detection of glaucoma and other disorders of the eye. Is the patient up to date with their annual eye exam?  Yes  Who is the provider or what is the name of the office in which the patient attends annual eye exams? [facility]  If pt is not established with a provider, would they like to be referred to a provider to establish care? No .   Dental Screening: Recommended annual dental exams for proper oral hygiene  Diabetic Foot Exam: N/A  Community Resource Referral / Chronic Care Management: CRR required this visit?  No   CCM required this visit?  No     Plan:     I have personally reviewed and noted the following in the patient's chart:   Medical and social history Use of alcohol, tobacco or illicit drugs  Current medications and supplements including opioid prescriptions. Patient is currently taking opioid prescriptions. Information provided to patient regarding non-opioid alternatives. Patient advised to discuss non-opioid treatment plan with their provider. Functional ability and  status Nutritional status Physical activity Advanced directives List of other physicians Hospitalizations, surgeries, and ER visits in previous 12 months Vitals Screenings to include cognitive, depression, and falls Referrals and appointments  In addition, I have reviewed and discussed with patient certain preventive protocols, quality metrics, and best practice recommendations. A written personalized care plan for preventive services as well as general preventive health recommendations were provided to patient.     Park Meo, FNP   07/05/2023   After Visit Summary: (In Person-Printed) AVS printed and given to the patient  Nurse Notes: Discussed Shingrix vaccine. Mammogram, Colonoscopy and bone density not ordered due to age.

## 2023-07-09 ENCOUNTER — Telehealth: Payer: Self-pay | Admitting: Family Medicine

## 2023-07-09 NOTE — Telephone Encounter (Signed)
 Form completed and ready for pickup. Nephew Reita Cliche will pick it up Monday.

## 2023-09-14 ENCOUNTER — Ambulatory Visit (INDEPENDENT_AMBULATORY_CARE_PROVIDER_SITE_OTHER): Payer: Medicare Other | Admitting: Family Medicine

## 2023-09-14 ENCOUNTER — Encounter: Payer: Self-pay | Admitting: Family Medicine

## 2023-09-14 VITALS — BP 120/68 | HR 96 | Temp 97.6°F | Ht 59.0 in | Wt 157.4 lb

## 2023-09-14 DIAGNOSIS — I4821 Permanent atrial fibrillation: Secondary | ICD-10-CM

## 2023-09-14 MED ORDER — FLUTICASONE PROPIONATE 50 MCG/ACT NA SUSP: 2.0000 | Freq: Every day | NASAL | 6 refills | Status: AC

## 2023-09-14 MED ORDER — PREDNISONE 20 MG PO TABS: ORAL_TABLET | ORAL | 0 refills | Status: AC

## 2023-09-14 NOTE — Progress Notes (Signed)
 +    Subjective:    Patient ID: Kathy [name], female    DOB: [birth year], 89 y.o.   MRN: [id number]  HPI Patient very sweet 89-year Caucasian female here today for a check up.  She denies any bleeding or bruising on Eliquis .  She denies any melena or hematochezia.  She denies any epistaxis.  Today she is in atrial fibrillation but her rate is controlled.  She denies any chest pain shortness of breath or dyspnea on exertion.  She is complaining of pain in her right wrist at the base of her thumb.  This radiates up the extensor tendon into her dorsal forearm.  It is made worse by certain flexion and extension of her wrist.  She thought this may be carpal tunnel but the history sounds more like a tendinitis than.  There is no palpable abnormality on exam.  He does have a positive Phalen sign but otherwise her hand exam is normal.  She has a negative Finkelstein maneuver.  There is no tenderness in the anatomic snuffbox. Past Medical History:  Diagnosis Date   Arthritis    both shoulders   Atrial fibrillation (HCC)    Breast cancer (HCC)    Cancer (HCC)    left breast   Cataracts, bilateral    Diverticulitis    Diverticulosis    Hemorrhoids    internal   Hiatal hernia    Hyperlipidemia    takes Zocor  daily   Hypertension    takes Amlodipine daily   Joint pain    both shoulders   Macular degeneration 02/2003   Nasal congestion    Nocturia    Obesity    Osteoporosis    PONV (postoperative nausea and vomiting)    Prediabetes    Shortness of breath    with exertion   Sinus drainage    Urinary frequency    Urinary incontinence    Past Surgical History:  Procedure Laterality Date   bilateral knee replacements  2008   BREAST BIOPSY  05/04/2011   Procedure: BREAST BIOPSY WITH NEEDLE LOCALIZATION;  Surgeon: Gorman Laughter, DO;  Location: [facility] OR;  Service: General;  Laterality: Left;   CARDIOVERSION N/A 09/15/2012   Procedure: CARDIOVERSION;  Surgeon: Jessica Morn, MD;   Location: [facility] ENDOSCOPY;  Service: Cardiovascular;  Laterality: N/A;  h&p in file-HW   COLONOSCOPY     ESOPHAGOGASTRODUODENOSCOPY     HEMORRHOID SURGERY     outpatient "done in doctor's office"   JOINT REPLACEMENT  2008   bilateral knee replacement   left breast biopsy  1984   right breast biopsy  1993   Current Outpatient Medications on File Prior to Visit  Medication Sig Dispense Refill   apixaban  (ELIQUIS ) 5 MG TABS tablet Take 1 tablet (5 mg total) by mouth 2 (two) times daily. Stop coumadin  180 tablet 3   Calcium Carbonate-Vitamin D  600-400 MG-UNIT tablet Take 1 tablet by mouth 2 (two) times daily.     cholecalciferol (VITAMIN D ) 400 UNITS TABS Take 400 Units by mouth daily. Take in addition to Caltrate with Vitamin D  to get 1200 units of Vitamin D      COMIRNATY SUSP injection      hydrochlorothiazide  (HYDRODIURIL ) 25 MG tablet Take 1 tablet (25 mg total) by mouth daily. 90 tablet 3   lisinopril  (ZESTRIL ) 20 MG tablet Take 1 tablet (20 mg total) by mouth daily. 90 tablet 3   metoprolol  tartrate (LOPRESSOR ) 50 MG tablet Take 1 tablet (50 mg total)  by mouth 2 (two) times daily. 180 tablet 3   oxyCODONE -acetaminophen  (PERCOCET) 5-325 MG tablet Take 1 tablet by mouth every 4 (four) hours as needed for severe pain (stop hydrocodone ). 30 tablet 0   simvastatin  (ZOCOR ) 20 MG tablet Take 1 tablet (20 mg total) by mouth at bedtime. 90 tablet 3   vitamin B-12 (CYANOCOBALAMIN) 1000 MCG tablet Take 1,000 mcg by mouth daily.     No current facility-administered medications on file prior to visit.   Aaron Aasall Social History   Socioeconomic History   Marital status: Widowed    Spouse name: Not on file   Number of children: 0   Years of education: Not on file   Highest education level: Not on file  Occupational History   Occupation: retired  Tobacco Use   Smoking status: Former    Current packs/day: 0.00    Average packs/day: 0.3 packs/day for 10.0 years (2.5 ttl pk-yrs)    Types: Cigarettes     Start date: 05/20/1961    Quit date: 05/21/1971    Years since quitting: 52.3   Smokeless tobacco: Never   Tobacco comments:    quit in the 60's  Substance and Sexual Activity   Alcohol use: No   Drug use: No   Sexual activity: Not Currently  Other Topics Concern   Not on file  Social History Narrative   Worked in Designer, fashion/clothing.    Widowed since 2014.    No children but nephew, Allayne Arabian and his wife Thersia Flax, care for patient.    Lives alone.    Social Drivers of Corporate investment banker Strain: Low Risk  (07/05/2023)   Overall Financial Resource Strain (CARDIA)    Difficulty of Paying Living Expenses: Not hard at all  Food Insecurity: No Food Insecurity (07/05/2023)   Hunger Vital Sign    Worried About Running Out of Food in the Last Year: Never true    Ran Out of Food in the Last Year: Never true  Transportation Needs: No Transportation Needs (07/05/2023)   PRAPARE - Administrator, Civil Service (Medical): No    Lack of Transportation (Non-Medical): No  Physical Activity: Insufficiently Active (07/05/2023)   Exercise Vital Sign    Days of Exercise per Week: 5 days    Minutes of Exercise per Session: 10 min  Stress: No Stress Concern Present (07/05/2023)   [employer] of Occupational Health - Occupational Stress Questionnaire    Feeling of Stress : Not at all  Social Connections: Moderately Integrated (07/05/2023)   Social Connection and Isolation Panel [NHANES]    Frequency of Communication with Friends and Family: More than three times a week    Frequency of Social Gatherings with Friends and Family: More than three times a week    Attends Religious Services: More than 4 times per year    Active Member of [employer] or Organizations: Yes    Attends Banker Meetings: More than 4 times per year    Marital Status: Widowed  Intimate Partner Violence: Not At Risk (07/05/2023)   Humiliation, Afraid, Rape, and Kick questionnaire    Fear of Current or  Ex-Partner: No    Emotionally Abused: No    Physically Abused: No    Sexually Abused: No     Review of Systems  All other systems reviewed and are negative.      Objective:   Physical Exam Constitutional:      General: She is not in acute distress.  Appearance: Normal appearance. She is normal weight. She is not ill-appearing or toxic-appearing.  Cardiovascular:     Rate and Rhythm: Normal rate. Rhythm irregular.     Heart sounds: Normal heart sounds.  Pulmonary:     Effort: Pulmonary effort is normal. No respiratory distress.     Breath sounds: No wheezing, rhonchi or rales.  Musculoskeletal:        General: No swelling or deformity.     Right lower leg: No edema.     Left lower leg: No edema.  Skin:    Findings: No bruising or erythema.  Neurological:     General: No focal deficit present.     Mental Status: She is alert and oriented to person, place, and time.  Psychiatric:        Mood and Affect: Mood normal.        Behavior: Behavior normal.        Thought Content: Thought content normal.           Assessment & Plan:  Permanent atrial fibrillation (HCC) - Plan: CBC with Differential/Platelet, Comprehensive metabolic panel with GFR Heart rate is well-controlled and the patient is appropriately anticoagulated.  I will check CBC and CMP to monitor labs.  Try the patient temporarily on prednisone  for possible tendinitis in the wrist extensor and thumb extensor compartment

## 2023-09-15 LAB — CBC WITH DIFFERENTIAL/PLATELET
Absolute Lymphocytes: 2726 {cells}/uL (ref 850–3900)
Absolute Monocytes: 515 {cells}/uL (ref 200–950)
Basophils Absolute: 40 {cells}/uL (ref 0–200)
Basophils Relative: 0.6 %
Eosinophils Absolute: 139 {cells}/uL (ref 15–500)
Eosinophils Relative: 2.1 %
HCT: 36.2 % (ref 35.0–45.0)
Hemoglobin: 11.7 g/dL (ref 11.7–15.5)
MCH: 33.6 pg — ABNORMAL HIGH (ref 27.0–33.0)
MCHC: 32.3 g/dL (ref 32.0–36.0)
MCV: 104 fL — ABNORMAL HIGH (ref 80.0–100.0)
MPV: 10.3 fL (ref 7.5–12.5)
Monocytes Relative: 7.8 %
Neutro Abs: 3181 {cells}/uL (ref 1500–7800)
Neutrophils Relative %: 48.2 %
Platelets: 186 10*3/uL (ref 140–400)
RBC: 3.48 10*6/uL — ABNORMAL LOW (ref 3.80–5.10)
RDW: 13 % (ref 11.0–15.0)
Total Lymphocyte: 41.3 %
WBC: 6.6 10*3/uL (ref 3.8–10.8)

## 2023-09-15 LAB — COMPREHENSIVE METABOLIC PANEL WITH GFR
AG Ratio: 1.6 (calc) (ref 1.0–2.5)
ALT: 10 U/L (ref 6–29)
AST: 16 U/L (ref 10–35)
Albumin: 4.2 g/dL (ref 3.6–5.1)
Alkaline phosphatase (APISO): 57 U/L (ref 37–153)
BUN/Creatinine Ratio: 24 (calc) — ABNORMAL HIGH (ref 6–22)
BUN: 28 mg/dL — ABNORMAL HIGH (ref 7–25)
CO2: 23 mmol/L (ref 20–32)
Calcium: 9.8 mg/dL (ref 8.6–10.4)
Chloride: 104 mmol/L (ref 98–110)
Creat: 1.16 mg/dL — ABNORMAL HIGH (ref 0.60–0.95)
Globulin: 2.6 g/dL (ref 1.9–3.7)
Glucose, Bld: 85 mg/dL (ref 65–99)
Potassium: 4.2 mmol/L (ref 3.5–5.3)
Sodium: 140 mmol/L (ref 135–146)
Total Bilirubin: 0.8 mg/dL (ref 0.2–1.2)
Total Protein: 6.8 g/dL (ref 6.1–8.1)
eGFR: 44 mL/min/{1.73_m2} — ABNORMAL LOW (ref >=60)

## 2023-09-16 ENCOUNTER — Telehealth: Payer: Self-pay

## 2023-09-16 ENCOUNTER — Ambulatory Visit: Payer: Self-pay | Admitting: Family Medicine

## 2023-09-16 NOTE — Telephone Encounter (Signed)
 Copied from CRM [id number]. Topic: Clinical - Lab/Test Results >> Sep 16, 2023  3:19 PM [name] wrote: Reason for CRM: Patient Niece returned missed call for labs. Read note as written by provider. Caller understood. No Further questions at this time. Thank You.

## 2023-11-15 ENCOUNTER — Encounter: Payer: Self-pay | Admitting: Family Medicine

## 2023-11-15 ENCOUNTER — Ambulatory Visit: Admitting: Family Medicine

## 2023-11-15 VITALS — BP 122/80 | HR 71 | Temp 97.7°F | Ht 59.0 in | Wt 157.0 lb

## 2023-11-15 DIAGNOSIS — N3281 Overactive bladder: Secondary | ICD-10-CM | POA: Diagnosis not present

## 2023-11-15 DIAGNOSIS — K5904 Chronic idiopathic constipation: Secondary | ICD-10-CM | POA: Diagnosis not present

## 2023-11-15 MED ORDER — SOLIFENACIN SUCCINATE 10 MG PO TABS: 10.0000 mg | ORAL_TABLET | Freq: Every day | ORAL | 5 refills | Status: DC

## 2023-11-15 NOTE — Progress Notes (Signed)
 +    Subjective:    Patient ID: Kathy [name], female    DOB: Mar 21, 1928, 89 y.o.   MRN: [id number]  HPI Patient very sweet 89-year Caucasian female here today for urinary incontinence.  She states that she wakes up at night having to urinate.  She is unable to make to the bathroom time.  She states that she frequently has urge incontinence.  She denies any dysuria, hesitancy.  She denies any hematuria.  She does report several months of lower abdominal pain.  It feels better when she has a bowel movement or passes gas.  She was taking MiraLAX which seemed to help however she has since stopped the medication and now she reports feeling bloated.  Her abdomen is soft, nontender, nondistended, with normal bowel sounds.  She is uncertain of how often she has a bowel movement but it does not occur on a daily basis.  The pain in her lower abdomen comes and goes away.  It is present for months. Past Medical History:  Diagnosis Date   Arthritis    both shoulders   Atrial fibrillation (HCC)    Breast cancer (HCC)    Cancer (HCC)    left breast   Cataracts, bilateral    Diverticulitis    Diverticulosis    Hemorrhoids    internal   Hiatal hernia    Hyperlipidemia    takes Zocor  daily   Hypertension    takes Amlodipine daily   Joint pain    both shoulders   Macular degeneration 02/2003   Nasal congestion    Nocturia    Obesity    Osteoporosis    PONV (postoperative nausea and vomiting)    Prediabetes    Shortness of breath    with exertion   Sinus drainage    Urinary frequency    Urinary incontinence    Past Surgical History:  Procedure Laterality Date   bilateral knee replacements  2008   BREAST BIOPSY  05/04/2011   Procedure: BREAST BIOPSY WITH NEEDLE LOCALIZATION;  Surgeon: Redell Alm Faith, DO;  Location: [facility] OR;  Service: General;  Laterality: Left;   CARDIOVERSION N/A 09/15/2012   Procedure: CARDIOVERSION;  Surgeon: Erick JONELLE Bergamo, MD;  Location: [facility] ENDOSCOPY;  Service:  Cardiovascular;  Laterality: N/A;  h&p in file-HW   COLONOSCOPY     ESOPHAGOGASTRODUODENOSCOPY     HEMORRHOID SURGERY     outpatient done in doctor's office   JOINT REPLACEMENT  2008   bilateral knee replacement   left breast biopsy  1984   right breast biopsy  1993   Current Outpatient Medications on File Prior to Visit  Medication Sig Dispense Refill   apixaban  (ELIQUIS ) 5 MG TABS tablet Take 1 tablet (5 mg total) by mouth 2 (two) times daily. Stop coumadin  180 tablet 3   Calcium Carbonate-Vitamin D  600-400 MG-UNIT tablet Take 1 tablet by mouth 2 (two) times daily.     cholecalciferol (VITAMIN D ) 400 UNITS TABS Take 400 Units by mouth daily. Take in addition to Caltrate with Vitamin D  to get 1200 units of Vitamin D      fluticasone  (FLONASE ) 50 MCG/ACT nasal spray Place 2 sprays into both nostrils daily. 16 g 6   hydrochlorothiazide  (HYDRODIURIL ) 25 MG tablet Take 1 tablet (25 mg total) by mouth daily. 90 tablet 3   lisinopril  (ZESTRIL ) 20 MG tablet Take 1 tablet (20 mg total) by mouth daily. 90 tablet 3   metoprolol  tartrate (LOPRESSOR ) 50 MG tablet Take 1 tablet (  50 mg total) by mouth 2 (two) times daily. 180 tablet 3   predniSONE  (DELTASONE ) 20 MG tablet 3 tabs poqday 1-2, 2 tabs poqday 3-4, 1 tab poqday 5-6 12 tablet 0   simvastatin  (ZOCOR ) 20 MG tablet Take 1 tablet (20 mg total) by mouth at bedtime. 90 tablet 3   vitamin B-12 (CYANOCOBALAMIN) 1000 MCG tablet Take 1,000 mcg by mouth daily.     oxyCODONE -acetaminophen  (PERCOCET) 5-325 MG tablet Take 1 tablet by mouth every 4 (four) hours as needed for severe pain (stop hydrocodone ). (Patient not taking: Reported on 11/15/2023) 30 tablet 0   No current facility-administered medications on file prior to visit.   SABRAall Social History   Socioeconomic History   Marital status: Widowed    Spouse name: Not on file   Number of children: 0   Years of education: Not on file   Highest education level: Not on file  Occupational History    Occupation: retired  Tobacco Use   Smoking status: Former    Current packs/day: 0.00    Average packs/day: 0.3 packs/day for 10.0 years (2.5 ttl pk-yrs)    Types: Cigarettes    Start date: 05/20/1961    Quit date: 05/21/1971    Years since quitting: 52.5   Smokeless tobacco: Never   Tobacco comments:    quit in the 60's  Substance and Sexual Activity   Alcohol use: No   Drug use: No   Sexual activity: Not Currently  Other Topics Concern   Not on file  Social History Narrative   Worked in Designer, fashion/clothing.    Widowed since 2014.    No children but nephew, Beryl and his wife Nathanel, care for patient.    Lives alone.    Social Drivers of Corporate investment banker Strain: Low Risk  (07/05/2023)   Overall Financial Resource Strain (CARDIA)    Difficulty of Paying Living Expenses: Not hard at all  Food Insecurity: No Food Insecurity (07/05/2023)   Hunger Vital Sign    Worried About Running Out of Food in the Last Year: Never true    Ran Out of Food in the Last Year: Never true  Transportation Needs: No Transportation Needs (07/05/2023)   PRAPARE - Administrator, Civil Service (Medical): No    Lack of Transportation (Non-Medical): No  Physical Activity: Insufficiently Active (07/05/2023)   Exercise Vital Sign    Days of Exercise per Week: 5 days    Minutes of Exercise per Session: 10 min  Stress: No Stress Concern Present (07/05/2023)   [employer] of Occupational Health - Occupational Stress Questionnaire    Feeling of Stress : Not at all  Social Connections: Moderately Integrated (07/05/2023)   Social Connection and Isolation Panel    Frequency of Communication with Friends and Family: More than three times a week    Frequency of Social Gatherings with Friends and Family: More than three times a week    Attends Religious Services: More than 4 times per year    Active Member of [employer] or Organizations: Yes    Attends Banker Meetings: More than 4 times per  year    Marital Status: Widowed  Intimate Partner Violence: Not At Risk (07/05/2023)   Humiliation, Afraid, Rape, and Kick questionnaire    Fear of Current or Ex-Partner: No    Emotionally Abused: No    Physically Abused: No    Sexually Abused: No     Review of Systems  All  other systems reviewed and are negative.      Objective:   Physical Exam Constitutional:      General: She is not in acute distress.    Appearance: Normal appearance. She is normal weight. She is not ill-appearing or toxic-appearing.  Cardiovascular:     Rate and Rhythm: Normal rate. Rhythm irregular.     Heart sounds: Normal heart sounds.  Pulmonary:     Effort: Pulmonary effort is normal. No respiratory distress.     Breath sounds: No wheezing, rhonchi or rales.  Musculoskeletal:        General: No swelling or deformity.     Right lower leg: No edema.     Left lower leg: No edema.  Skin:    Findings: No bruising or erythema.  Neurological:     General: No focal deficit present.     Mental Status: She is alert and oriented to person, place, and time.  Psychiatric:        Mood and Affect: Mood normal.        Behavior: Behavior normal.        Thought Content: Thought content normal.           Assessment & Plan:  OAB (overactive bladder)  Chronic idiopathic constipation No evidence of an acute abdomen today on exam.  I believe her chronic lower abdominal pain is likely intestinal spasms due to constipation.  Begin MiraLAX daily and see if this alleviates the problem.  If not, consider imaging of the abdomen and pelvis if no better in 2 to 3 weeks.  The patient frequently has urge incontinence.  Discontinue hydrochlorothiazide  and try Vesicare  10 mg a day.

## 2023-11-19 ENCOUNTER — Other Ambulatory Visit: Payer: Self-pay | Admitting: Family Medicine

## 2023-12-01 ENCOUNTER — Telehealth: Payer: Self-pay

## 2023-12-01 NOTE — Telephone Encounter (Signed)
 Pt called in to give you and update on how she is doing since her visit on 11/15/2023. Pt states she is doing much better and the Vesicare  is helping with her incontinence. No further issues with constipation. Thank you.

## 2023-12-11 ENCOUNTER — Encounter (HOSPITAL_COMMUNITY): Payer: Self-pay

## 2023-12-11 ENCOUNTER — Emergency Department (HOSPITAL_COMMUNITY)

## 2023-12-11 ENCOUNTER — Other Ambulatory Visit: Payer: Self-pay

## 2023-12-11 ENCOUNTER — Emergency Department (HOSPITAL_COMMUNITY)
Admission: EM | Admit: 2023-12-11 | Discharge: 2023-12-11 | Disposition: A | Discharge status: Home / Self Care | Attending: Emergency Medicine | Admitting: Emergency Medicine

## 2023-12-11 DIAGNOSIS — W01198A Fall on same level from slipping, tripping and stumbling with subsequent striking against other object, initial encounter: Secondary | ICD-10-CM | POA: Diagnosis not present

## 2023-12-11 DIAGNOSIS — W19XXXA Unspecified fall, initial encounter: Secondary | ICD-10-CM

## 2023-12-11 DIAGNOSIS — S199XXA Unspecified injury of neck, initial encounter: Secondary | ICD-10-CM | POA: Diagnosis not present

## 2023-12-11 DIAGNOSIS — Z7901 Long term (current) use of anticoagulants: Secondary | ICD-10-CM | POA: Diagnosis not present

## 2023-12-11 DIAGNOSIS — S0990XA Unspecified injury of head, initial encounter: Secondary | ICD-10-CM | POA: Diagnosis not present

## 2023-12-11 NOTE — ED Provider Notes (Signed)
 [facility] EMERGENCY DEPARTMENT AT [facility] Provider Note   CSN: [id number] Arrival date & time: 12/11/23  9046     Patient presents with: No chief complaint on file.   Kathy [name] is a 89 y.o. female.   89 year old female who lives by herself with prior medical history as detailed below presents for evaluation.  Patient was getting up to turn on the space heater this morning.  She managed to lose her balance and fall.  She struck the back of her head.  She is on Eliquis  with a history of A-fib.  She denies LOC.  She denies injury.  After the fall she called 911.  EMS transported here without incident.  On arrival the patient denies pain.  She reports that she already took some Tylenol .  She denies neck pain, nausea, vomiting, extremity injury.  The history is provided by the patient, medical records and the EMS personnel.       Prior to Admission medications   Medication Sig Start Date End Date Taking? Authorizing Provider  apixaban  (ELIQUIS ) 5 MG TABS tablet Take 1 tablet (5 mg total) by mouth 2 (two) times daily. Stop coumadin  03/15/23   Duanne Butler DASEN, MD  Calcium Carbonate-Vitamin D  600-400 MG-UNIT tablet Take 1 tablet by mouth 2 (two) times daily.    [provider]  cholecalciferol (VITAMIN D ) 400 UNITS TABS Take 400 Units by mouth daily. Take in addition to Caltrate with Vitamin D  to get 1200 units of Vitamin D     [provider]  fluticasone  (FLONASE ) 50 MCG/ACT nasal spray Place 2 sprays into both nostrils daily. 09/14/23   Duanne Butler DASEN, MD  hydrochlorothiazide  (HYDRODIURIL ) 25 MG tablet Take 1 tablet (25 mg total) by mouth daily. 03/15/23   Duanne Butler DASEN, MD  lisinopril  (ZESTRIL ) 20 MG tablet Take 1 tablet (20 mg total) by mouth daily. 03/15/23   Duanne Butler DASEN, MD  metoprolol  tartrate (LOPRESSOR ) 50 MG tablet Take 1 tablet (50 mg total) by mouth 2 (two) times daily. 03/15/23   Duanne Butler DASEN, MD  oxyCODONE -acetaminophen   (PERCOCET) 5-325 MG tablet Take 1 tablet by mouth every 4 (four) hours as needed for severe pain (stop hydrocodone ). Patient not taking: Reported on 11/15/2023 12/08/21   Duanne Butler DASEN, MD  predniSONE  (DELTASONE ) 20 MG tablet 3 tabs poqday 1-2, 2 tabs poqday 3-4, 1 tab poqday 5-6 09/14/23   Duanne Butler DASEN, MD  simvastatin  (ZOCOR ) 20 MG tablet Take 1 tablet (20 mg total) by mouth at bedtime. 03/15/23   Duanne Butler DASEN, MD  solifenacin  (VESICARE ) 10 MG tablet Take 1 tablet (10 mg total) by mouth daily. 11/15/23   Duanne Butler DASEN, MD  vitamin B-12 (CYANOCOBALAMIN) 1000 MCG tablet Take 1,000 mcg by mouth daily.    [provider]    Allergies: Patient has no known allergies.    Review of Systems  All other systems reviewed and are negative.   Updated Vital Signs SpO2 98%   Physical Exam Vitals and nursing note reviewed.  Constitutional:      General: She is not in acute distress.    Appearance: Normal appearance. She is well-developed.  HENT:     Head: Normocephalic.     Comments: Small contusion noted to the occiput. Eyes:     Conjunctiva/sclera: Conjunctivae normal.     Pupils: Pupils are equal, round, and reactive to light.  Cardiovascular:     Rate and Rhythm: Normal rate and regular rhythm.  Heart sounds: Normal heart sounds.  Pulmonary:     Effort: Pulmonary effort is normal. No respiratory distress.     Breath sounds: Normal breath sounds.  Abdominal:     General: There is no distension.     Palpations: Abdomen is soft.     Tenderness: There is no abdominal tenderness.  Musculoskeletal:        General: No deformity. Normal range of motion.     Cervical back: Normal range of motion and neck supple.  Skin:    General: Skin is warm and dry.  Neurological:     General: No focal deficit present.     Mental Status: She is alert and oriented to person, place, and time.     (all labs ordered are listed, but only abnormal results are displayed) Labs  Reviewed - No data to display  EKG: None  Radiology: No results found.   Procedures   Medications Ordered in the ED - No data to display                                  Medical Decision Making Patient presents after fall with head injury.  She is on Eliquis .    She denies significant traumatic injury.  Obtained imaging is reassuringly without acute abnormality.  On reevaluation the patient feels much improved.  Importance of close follow-up is stressed.  Strict return precautions given and understood.  Amount and/or Complexity of Data Reviewed Radiology: ordered.        Final diagnoses:  Fall, initial encounter  Injury of head, initial encounter    ED Discharge Orders     None          Laurice Maude BROCKS, MD 12/11/23 [phone]

## 2023-12-11 NOTE — Progress Notes (Signed)
 Orthopedic Tech Progress Note Patient Details:  Arlita Buffkin Jul 01, 1927 [id number]  Level II trauma, no ortho tech orders at this time.  Patient ID: Cyntha Brickman, female   DOB: 23-Dec-1927, 89 y.o.   MRN: [id number]  Tinnie Ronal Brasil 12/11/2023, 11:59 AM

## 2023-12-11 NOTE — Progress Notes (Signed)
   12/11/23 1100  Spiritual Encounters  Type of Visit Initial  OnCall Visit Yes   Chaplain responded to Trauma 2 page, Fall on Thinners.  Pt unavailable as medical team provides care.  No support persons present.  Chaplain will continue to follow, but no further services needed at this time.  Chaplain services remain available as the need arises by Spiritual Consult or for emergent cases, paging [phone]

## 2023-12-11 NOTE — ED Notes (Signed)
 Pt arrives to ED via [facility] from home. Pt was leaning over to turn on heater, pt lost her balance, falling backwards and hitting head. Pt has 1in hematoma to back of head, denies any other injuries. Pt was unable to get off floor, 911 was called by pendant. Pt is A&Ox4, NAD on arrival, pt takes eliquis  for afib.   20g R hand  EMS VS 130/86 76 irreg Cbg 94

## 2023-12-11 NOTE — Discharge Instructions (Signed)
 Return for any problem.  ?

## 2023-12-11 NOTE — ED Notes (Signed)
 Pt to CT scanner

## 2023-12-23 ENCOUNTER — Other Ambulatory Visit: Payer: Self-pay | Admitting: Family Medicine

## 2024-01-03 ENCOUNTER — Other Ambulatory Visit: Payer: Self-pay | Admitting: Family Medicine

## 2024-01-03 DIAGNOSIS — Z9842 Cataract extraction status, left eye: Secondary | ICD-10-CM | POA: Diagnosis not present

## 2024-01-03 DIAGNOSIS — H353131 Nonexudative age-related macular degeneration, bilateral, early dry stage: Secondary | ICD-10-CM | POA: Diagnosis not present

## 2024-01-03 DIAGNOSIS — Z9841 Cataract extraction status, right eye: Secondary | ICD-10-CM | POA: Diagnosis not present

## 2024-01-03 DIAGNOSIS — H52223 Regular astigmatism, bilateral: Secondary | ICD-10-CM | POA: Diagnosis not present

## 2024-01-06 ENCOUNTER — Encounter: Payer: Self-pay | Admitting: Family Medicine

## 2024-02-05 ENCOUNTER — Other Ambulatory Visit: Payer: Self-pay | Admitting: Family Medicine

## 2024-02-18 ENCOUNTER — Other Ambulatory Visit: Payer: Self-pay | Admitting: Family Medicine

## 2024-04-10 LAB — HM MAMMOGRAPHY

## 2024-04-13 ENCOUNTER — Encounter: Payer: Self-pay | Admitting: Family Medicine

## 2024-05-04 ENCOUNTER — Ambulatory Visit: Admitting: Family Medicine

## 2024-05-04 ENCOUNTER — Encounter: Payer: Self-pay | Admitting: Family Medicine

## 2024-05-04 VITALS — BP 120/66 | HR 57 | Temp 97.7°F | Ht 59.0 in | Wt 150.8 lb

## 2024-05-04 DIAGNOSIS — I482 Chronic atrial fibrillation, unspecified: Secondary | ICD-10-CM

## 2024-05-04 DIAGNOSIS — I10 Essential (primary) hypertension: Secondary | ICD-10-CM

## 2024-05-04 LAB — CBC WITH DIFFERENTIAL/PLATELET
Absolute Lymphocytes: 2246 {cells}/uL (ref 850–3900)
Absolute Monocytes: 634 {cells}/uL (ref 200–950)
Basophils Absolute: 38 {cells}/uL (ref 0–200)
Basophils Relative: 0.6 %
Eosinophils Absolute: 90 {cells}/uL (ref 15–500)
Eosinophils Relative: 1.4 %
HCT: 38.6 % (ref 35.9–46.0)
Hemoglobin: 12.3 g/dL (ref 11.7–15.5)
MCH: 33.2 pg — ABNORMAL HIGH (ref 27.0–33.0)
MCHC: 31.9 g/dL (ref 31.6–35.4)
MCV: 104.3 fL — ABNORMAL HIGH (ref 81.4–101.7)
MPV: 11.2 fL (ref 7.5–12.5)
Monocytes Relative: 9.9 %
Neutro Abs: 3392 {cells}/uL (ref 1500–7800)
Neutrophils Relative %: 53 %
Platelets: 185 Thousand/uL (ref 140–400)
RBC: 3.7 Million/uL — ABNORMAL LOW (ref 3.80–5.10)
RDW: 12.9 % (ref 11.0–15.0)
Total Lymphocyte: 35.1 %
WBC: 6.4 Thousand/uL (ref 3.8–10.8)

## 2024-05-04 LAB — BASIC METABOLIC PANEL WITHOUT GFR
BUN/Creatinine Ratio: 30 (calc) — ABNORMAL HIGH (ref 6–22)
BUN: 31 mg/dL — ABNORMAL HIGH (ref 7–25)
CO2: 21 mmol/L (ref 20–32)
Calcium: 9.4 mg/dL (ref 8.6–10.4)
Chloride: 104 mmol/L (ref 98–110)
Creat: 1.02 mg/dL — ABNORMAL HIGH (ref 0.60–0.95)
Glucose, Bld: 93 mg/dL (ref 65–99)
Potassium: 4.9 mmol/L (ref 3.5–5.3)
Sodium: 138 mmol/L (ref 135–146)

## 2024-05-04 LAB — LIPID PANEL
Cholesterol: 90 mg/dL
HDL: 40 mg/dL — ABNORMAL LOW
LDL Cholesterol (Calc): 34 mg/dL
Non-HDL Cholesterol (Calc): 50 mg/dL
Total CHOL/HDL Ratio: 2.3 (calc)
Triglycerides: 84 mg/dL

## 2024-05-04 NOTE — Progress Notes (Signed)
 +    Subjective:    Patient ID: Kathy [name], female    DOB: 1927-10-04, 89 y.o.   MRN: [id number]  HPI Patient is a very sweet 89 year old Caucasian female with a history of hypertension and atrial fibrillation.  Her heart rate is controlled today on metoprolol .  She denies any syncope.  She denies any palpitations.  She denies any lightheadedness.  She is currently anticoagulated with Eliquis .  She is on full dose Eliquis  despite being 89 years old.  She denies any bruising or bleeding.  Her weight is almost 70 kg.  I will check her creatinine today to determine if we need to reduce the dose of Eliquis .  Her blood pressure today is well-controlled at 120/66.  She denies any chest pain or shortness of breath.  Otherwise she is doing well with no concerns. Past Medical History:  Diagnosis Date   Arthritis    both shoulders   Atrial fibrillation (HCC)    Breast cancer (HCC)    Cancer (HCC)    left breast   Cataracts, bilateral    Diverticulitis    Diverticulosis    Hemorrhoids    internal   Hiatal hernia    Hyperlipidemia    takes Zocor  daily   Hypertension    takes Amlodipine daily   Joint pain    both shoulders   Macular degeneration 02/2003   Nasal congestion    Nocturia    Obesity    Osteoporosis    PONV (postoperative nausea and vomiting)    Prediabetes    Shortness of breath    with exertion   Sinus drainage    Urinary frequency    Urinary incontinence    Past Surgical History:  Procedure Laterality Date   bilateral knee replacements  2008   BREAST BIOPSY  05/04/2011   Procedure: BREAST BIOPSY WITH NEEDLE LOCALIZATION;  Surgeon: Redell Alm Faith, DO;  Location: [facility] OR;  Service: General;  Laterality: Left;   CARDIOVERSION N/A 09/15/2012   Procedure: CARDIOVERSION;  Surgeon: Erick JONELLE Bergamo, MD;  Location: [facility] ENDOSCOPY;  Service: Cardiovascular;  Laterality: N/A;  h&p in file-HW   COLONOSCOPY     ESOPHAGOGASTRODUODENOSCOPY     HEMORRHOID SURGERY     outpatient  done in doctor's office   JOINT REPLACEMENT  2008   bilateral knee replacement   left breast biopsy  1984   right breast biopsy  1993   Current Outpatient Medications on File Prior to Visit  Medication Sig Dispense Refill   Calcium Carbonate-Vitamin D  600-400 MG-UNIT tablet Take 1 tablet by mouth 2 (two) times daily.     cholecalciferol (VITAMIN D ) 400 UNITS TABS Take 400 Units by mouth daily. Take in addition to Caltrate with Vitamin D  to get 1200 units of Vitamin D      ELIQUIS  5 MG TABS tablet TAKE 1 TABLET BY MOUTH TWICE  DAILY 200 tablet 2   fluticasone  (FLONASE ) 50 MCG/ACT nasal spray Place 2 sprays into both nostrils daily. 16 g 6   hydrochlorothiazide  (HYDRODIURIL ) 25 MG tablet Take 1 tablet (25 mg total) by mouth daily. 90 tablet 3   lisinopril  (ZESTRIL ) 20 MG tablet TAKE 1 TABLET BY MOUTH DAILY 100 tablet 2   metoprolol  tartrate (LOPRESSOR ) 50 MG tablet TAKE 1 TABLET BY MOUTH TWICE  DAILY 200 tablet 2   simvastatin  (ZOCOR ) 20 MG tablet TAKE 1 TABLET BY MOUTH AT  BEDTIME 60 tablet 5   solifenacin  (VESICARE ) 10 MG tablet TAKE 1 TABLET BY MOUTH  ONCE  DAILY 100 tablet 2   vitamin B-12 (CYANOCOBALAMIN) 1000 MCG tablet Take 1,000 mcg by mouth daily.     oxyCODONE -acetaminophen  (PERCOCET) 5-325 MG tablet Take 1 tablet by mouth every 4 (four) hours as needed for severe pain (stop hydrocodone ). (Patient not taking: Reported on 05/04/2024) 30 tablet 0   predniSONE  (DELTASONE ) 20 MG tablet 3 tabs poqday 1-2, 2 tabs poqday 3-4, 1 tab poqday 5-6 (Patient not taking: Reported on 05/04/2024) 12 tablet 0   No current facility-administered medications on file prior to visit.   SABRAall Social History   Socioeconomic History   Marital status: Widowed    Spouse name: Not on file   Number of children: 0   Years of education: Not on file   Highest education level: Not on file  Occupational History   Occupation: retired  Tobacco Use   Smoking status: Former    Current packs/day: 0.00    Average  packs/day: 0.3 packs/day for 10.0 years (2.5 ttl pk-yrs)    Types: Cigarettes    Start date: 05/20/1961    Quit date: 05/21/1971    Years since quitting: 52.9   Smokeless tobacco: Never   Tobacco comments:    quit in the 60's  Vaping Use   Vaping status: Never Used  Substance and Sexual Activity   Alcohol use: No   Drug use: No   Sexual activity: Not Currently  Other Topics Concern   Not on file  Social History Narrative   Worked in Designer, Fashion/clothing.    Widowed since 2014.    No children but nephew, Beryl and his wife Nathanel, care for patient.    Lives alone.    Social Drivers of Health   Tobacco Use: Medium Risk (05/04/2024)   Patient History    Smoking Tobacco Use: Former    Smokeless Tobacco Use: Never    Passive Exposure: Not on file  Financial Resource Strain: Low Risk (07/05/2023)   Overall Financial Resource Strain (CARDIA)    Difficulty of Paying Living Expenses: Not hard at all  Food Insecurity: No Food Insecurity (07/05/2023)   Hunger Vital Sign    Worried About Running Out of Food in the Last Year: Never true    Ran Out of Food in the Last Year: Never true  Transportation Needs: No Transportation Needs (07/05/2023)   PRAPARE - Administrator, Civil Service (Medical): No    Lack of Transportation (Non-Medical): No  Physical Activity: Insufficiently Active (07/05/2023)   Exercise Vital Sign    Days of Exercise per Week: 5 days    Minutes of Exercise per Session: 10 min  Stress: No Stress Concern Present (07/05/2023)   [employer] of Occupational Health - Occupational Stress Questionnaire    Feeling of Stress : Not at all  Social Connections: Moderately Integrated (07/05/2023)   Social Connection and Isolation Panel    Frequency of Communication with Friends and Family: More than three times a week    Frequency of Social Gatherings with Friends and Family: More than three times a week    Attends Religious Services: More than 4 times per year    Active Member  of [employer] or Organizations: Yes    Attends Banker Meetings: More than 4 times per year    Marital Status: Widowed  Intimate Partner Violence: Not At Risk (07/05/2023)   Humiliation, Afraid, Rape, and Kick questionnaire    Fear of Current or Ex-Partner: No    Emotionally Abused: No  Physically Abused: No    Sexually Abused: No  Depression (PHQ2-9): Low Risk (07/05/2023)   Depression (PHQ2-9)    PHQ-2 Score: 0  Alcohol Screen: Low Risk (07/05/2023)   Alcohol Screen    Last Alcohol Screening Score (AUDIT): 0  Housing: Low Risk (07/02/2022)   Housing    Last Housing Risk Score: 0  Utilities: Not At Risk (07/05/2023)   AHC Utilities    Threatened with loss of utilities: No  Health Literacy: Adequate Health Literacy (07/05/2023)   B1300 Health Literacy    Frequency of need for help with medical instructions: Never     Review of Systems  All other systems reviewed and are negative.      Objective:   Physical Exam Constitutional:      General: She is not in acute distress.    Appearance: Normal appearance. She is normal weight. She is not ill-appearing or toxic-appearing.  Cardiovascular:     Rate and Rhythm: Normal rate. Rhythm irregular.     Heart sounds: Normal heart sounds.  Pulmonary:     Effort: Pulmonary effort is normal. No respiratory distress.     Breath sounds: No wheezing, rhonchi or rales.  Musculoskeletal:        General: No swelling or deformity.     Right lower leg: No edema.     Left lower leg: No edema.  Skin:    Findings: No bruising or erythema.  Neurological:     General: No focal deficit present.     Mental Status: She is alert and oriented to person, place, and time.  Psychiatric:        Mood and Affect: Mood normal.        Behavior: Behavior normal.        Thought Content: Thought content normal.           Assessment & Plan:  Benign essential HTN - Plan: CBC with Differential/Platelet, Basic Metabolic Panel Without GFR, Lipid  panel  Chronic atrial fibrillation (HCC) Heart rate and blood pressure are well-controlled on her medications.  She is appropriately anticoagulated with Eliquis .  Check CBC and CMP.  If creatinine is greater than 1.5 I will reduce her dose of Eliquis  to 2.5 mg twice daily.  Check fasting lipid panel.  Goal LDL cholesterol is less than 100.  Patient is currently taking Vesicare  for overactive bladder.  She states that this medication has benefited her dramatically.  She would like to continue this medication.  Her flu shot and COVID shot are up-to-date

## 2024-05-05 ENCOUNTER — Ambulatory Visit: Payer: Self-pay | Admitting: Family Medicine

## 2024-05-06 ENCOUNTER — Other Ambulatory Visit: Payer: Self-pay

## 2024-05-06 ENCOUNTER — Emergency Department (HOSPITAL_BASED_OUTPATIENT_CLINIC_OR_DEPARTMENT_OTHER)
Admission: EM | Admit: 2024-05-06 | Discharge: 2024-05-06 | Disposition: A | Discharge status: Home / Self Care | Attending: Emergency Medicine | Admitting: Emergency Medicine

## 2024-05-06 ENCOUNTER — Encounter (HOSPITAL_BASED_OUTPATIENT_CLINIC_OR_DEPARTMENT_OTHER): Payer: Self-pay

## 2024-05-06 DIAGNOSIS — Z853 Personal history of malignant neoplasm of breast: Secondary | ICD-10-CM | POA: Insufficient documentation

## 2024-05-06 DIAGNOSIS — Z79899 Other long term (current) drug therapy: Secondary | ICD-10-CM | POA: Diagnosis not present

## 2024-05-06 DIAGNOSIS — I10 Essential (primary) hypertension: Secondary | ICD-10-CM | POA: Diagnosis not present

## 2024-05-06 DIAGNOSIS — I4811 Longstanding persistent atrial fibrillation: Secondary | ICD-10-CM | POA: Diagnosis not present

## 2024-05-06 DIAGNOSIS — Z7901 Long term (current) use of anticoagulants: Secondary | ICD-10-CM | POA: Diagnosis not present

## 2024-05-06 DIAGNOSIS — R42 Dizziness and giddiness: Secondary | ICD-10-CM

## 2024-05-06 NOTE — ED Triage Notes (Addendum)
 Patient states she was getting out of bed this morning and felt dizzy. Patient lowered herself to the floor. Denies falling. States not dizzy at this time just wanted to be evaluated. Patient ambulatory to triage.

## 2024-05-06 NOTE — Discharge Instructions (Signed)
 You were seen in the emergency department for an episode of lightheadedness at home Your vital signs including blood pressure and heart rate look normal Your EKG showed atrial fibrillation which you have a history of but no other concerning findings Continue taking all previous prescribed indications You felt well and were able to walk without issue here Follow-up with your primary doctor within 1 week for reevaluation Return to the Emergency Department for repeated episodes chest pain trouble breathing or any other concerns

## 2024-05-06 NOTE — ED Provider Notes (Signed)
 " Kathy [name]   CSN: [id number] Arrival date & time: 05/06/24  [id number]     Patient presents with: Dizziness   Kathy [name] is a 89 y.o. female.  With a history of A-fib on Eliquis , hypertension and breast cancer who presents to the ED for dizziness.  Patient stood up from her bed this morning and felt very lightheaded.  She was able to lower herself to the floor.  No falls or injury.  Her husband helped her up off of the floor.  She was still a bit lightheaded but states the lightheadedness has resolved.  No chest pain headaches changes in vision shortness of breath nausea vomiting fevers chills recent illness.  Recently started on Vesicare  for urinary incontinence.  Her primary doctor told her to take this medication before bed as it can lower her blood pressure and make her lightheaded.   HPI     Prior to Admission medications  Medication Sig Start Date End Date Taking? Authorizing Provider  Calcium Carbonate-Vitamin D  600-400 MG-UNIT tablet Take 1 tablet by mouth 2 (two) times daily.    [provider]  cholecalciferol (VITAMIN D ) 400 UNITS TABS Take 400 Units by mouth daily. Take in addition to Caltrate with Vitamin D  to get 1200 units of Vitamin D     [provider]  ELIQUIS  5 MG TABS tablet TAKE 1 TABLET BY MOUTH TWICE  DAILY 12/24/23   Duanne Butler DASEN, MD  fluticasone  (FLONASE ) 50 MCG/ACT nasal spray Place 2 sprays into both nostrils daily. 09/14/23   Duanne Butler DASEN, MD  hydrochlorothiazide  (HYDRODIURIL ) 25 MG tablet Take 1 tablet (25 mg total) by mouth daily. 03/15/23   Duanne Butler DASEN, MD  lisinopril  (ZESTRIL ) 20 MG tablet TAKE 1 TABLET BY MOUTH DAILY 01/04/24   Duanne Butler DASEN, MD  metoprolol  tartrate (LOPRESSOR ) 50 MG tablet TAKE 1 TABLET BY MOUTH TWICE  DAILY 02/07/24   Aletha Bene, MD  oxyCODONE -acetaminophen  (PERCOCET) 5-325 MG tablet Take 1 tablet by mouth every 4 (four) hours as needed for  severe pain (stop hydrocodone ). Patient not taking: Reported on 05/04/2024 12/08/21   Duanne Butler DASEN, MD  predniSONE  (DELTASONE ) 20 MG tablet 3 tabs poqday 1-2, 2 tabs poqday 3-4, 1 tab poqday 5-6 Patient not taking: Reported on 05/04/2024 09/14/23   Duanne Butler DASEN, MD  simvastatin  (ZOCOR ) 20 MG tablet TAKE 1 TABLET BY MOUTH AT  BEDTIME 01/04/24   Duanne Butler DASEN, MD  solifenacin  (VESICARE ) 10 MG tablet TAKE 1 TABLET BY MOUTH ONCE  DAILY 02/18/24   Duanne Butler DASEN, MD  vitamin B-12 (CYANOCOBALAMIN) 1000 MCG tablet Take 1,000 mcg by mouth daily.    [provider]    Allergies: Patient has no known allergies.    Review of Systems  Updated Vital Signs BP (!) 141/79   Pulse 90   Temp 98 F (36.7 C) (Oral)   Resp 20   Ht 4' 11 (1.499 m)   Wt 68 kg   SpO2 98%   BMI 30.28 kg/m   Physical Exam Vitals and nursing [name] reviewed.  HENT:     Head: Normocephalic and atraumatic.  Eyes:     Pupils: Pupils are equal, round, and reactive to light.  Cardiovascular:     Rate and Rhythm: Normal rate. Rhythm irregular.  Pulmonary:     Effort: Pulmonary effort is normal.     Breath sounds: Normal breath sounds.  Abdominal:     Palpations: Abdomen is  soft.     Tenderness: There is no abdominal tenderness.  Skin:    General: Skin is warm and dry.  Neurological:     General: No focal deficit present.     Mental Status: She is alert.     Sensory: No sensory deficit.     Motor: No weakness.  Psychiatric:        Mood and Affect: Mood normal.     (all labs ordered are listed, but only abnormal results are displayed) Labs Reviewed - No data to display  EKG: EKG Interpretation Date/Time:  Saturday May 06 2024 07:53:19 EST Ventricular Rate:  98 PR Interval:    QRS Duration:  106 QT Interval:  399 QTC Calculation: 425 R Axis:   47  Text Interpretation: Atrial fibrillation Ventricular bigeminy Confirmed by Pamella Sharper [phone]) on 05/06/2024 8:28:24 AM  Radiology: No  results found.   Procedures   Medications Ordered in the ED - No data to display                                  Medical Decision Making 89 year old female with history as above presenting to the ED for transient lightheadedness after standing up from bed attempting to ambulate to the bathroom this morning.  She was able to lower self to the ground.  No other symptoms that would be worrisome for TIA stroke ACS.  Metabolic panel and CBC from 2 days ago are unremarkable overall.  No indication to repeat labs today.  Evaluated for dysrhythmia with EKG which shows atrial fibrillation rate controlled.  Compliant with her Eliquis  and other medications.  Blood pressure within normal limits with normal orthostatic vital signs and ambulation trial.  Suspect transient lightheadedness may be due to positional change at home.  Counseled patient to stand up and change positions slowly before attempting to walk with the hope of avoiding falls at home.  Appropriate for discharge        Final diagnoses:  Lightheadedness  Longstanding persistent atrial fibrillation [facility])    ED Discharge Orders     None          Pamella Sharper LABOR, DO 05/06/24 9089  "

## 2024-11-06 ENCOUNTER — Ambulatory Visit: Admitting: Family Medicine
